# Patient Record
Sex: Male | Born: 1945 | Race: Black or African American | Hispanic: No | Marital: Married
Health system: Midwestern US, Community
[De-identification: ages and names within clinical notes are randomized; demographics above are authoritative.]

## PROBLEM LIST (undated history)

## (undated) DIAGNOSIS — Z8679 Personal history of other diseases of the circulatory system: Secondary | ICD-10-CM

## (undated) DIAGNOSIS — K4090 Unilateral inguinal hernia, without obstruction or gangrene, not specified as recurrent: Secondary | ICD-10-CM

## (undated) DIAGNOSIS — Z87442 Personal history of urinary calculi: Secondary | ICD-10-CM

## (undated) DIAGNOSIS — D696 Thrombocytopenia, unspecified: Secondary | ICD-10-CM

## (undated) DIAGNOSIS — K5792 Diverticulitis of intestine, part unspecified, without perforation or abscess without bleeding: Secondary | ICD-10-CM

## (undated) DIAGNOSIS — M1990 Unspecified osteoarthritis, unspecified site: Secondary | ICD-10-CM

## (undated) DIAGNOSIS — R7303 Prediabetes: Secondary | ICD-10-CM

## (undated) DIAGNOSIS — K760 Fatty (change of) liver, not elsewhere classified: Secondary | ICD-10-CM

## (undated) DIAGNOSIS — I10 Essential (primary) hypertension: Secondary | ICD-10-CM

## (undated) DIAGNOSIS — J45909 Unspecified asthma, uncomplicated: Secondary | ICD-10-CM

## (undated) DIAGNOSIS — G4730 Sleep apnea, unspecified: Secondary | ICD-10-CM

## (undated) DIAGNOSIS — K219 Gastro-esophageal reflux disease without esophagitis: Secondary | ICD-10-CM

## (undated) DIAGNOSIS — N400 Enlarged prostate without lower urinary tract symptoms: Secondary | ICD-10-CM

## (undated) DIAGNOSIS — K5790 Diverticulosis of intestine, part unspecified, without perforation or abscess without bleeding: Secondary | ICD-10-CM

## (undated) DIAGNOSIS — L570 Actinic keratosis: Secondary | ICD-10-CM

## (undated) DIAGNOSIS — E0590 Thyrotoxicosis, unspecified without thyrotoxic crisis or storm: Secondary | ICD-10-CM

## (undated) DIAGNOSIS — C61 Malignant neoplasm of prostate: Principal | ICD-10-CM

## (undated) HISTORY — PX: COLON RESECTION: SHX5231

## (undated) HISTORY — DX: Actinic keratosis: L57.0

## (undated) HISTORY — PX: CHOLECYSTECTOMY: SHX55

## (undated) HISTORY — PX: TRANSURETHRAL RESECTION OF PROSTATE: SHX73

---

## 2006-01-31 ENCOUNTER — Ambulatory Visit: Payer: Self-pay | Admitting: Unknown Physician Specialty

## 2006-03-15 ENCOUNTER — Ambulatory Visit: Payer: Self-pay | Admitting: Internal Medicine

## 2008-12-10 ENCOUNTER — Emergency Department: Payer: Self-pay | Admitting: Emergency Medicine

## 2009-04-09 ENCOUNTER — Ambulatory Visit: Payer: Self-pay | Admitting: Unknown Physician Specialty

## 2010-07-27 ENCOUNTER — Ambulatory Visit: Payer: Self-pay | Admitting: Unknown Physician Specialty

## 2010-08-16 ENCOUNTER — Other Ambulatory Visit: Payer: Self-pay

## 2010-09-14 ENCOUNTER — Ambulatory Visit: Payer: Self-pay

## 2010-09-20 ENCOUNTER — Ambulatory Visit: Payer: Self-pay | Admitting: Surgery

## 2010-09-27 ENCOUNTER — Inpatient Hospital Stay: Payer: Self-pay | Admitting: Surgery

## 2010-09-30 LAB — PATHOLOGY REPORT

## 2010-10-12 ENCOUNTER — Ambulatory Visit: Payer: Self-pay

## 2010-11-24 ENCOUNTER — Ambulatory Visit: Payer: Self-pay

## 2010-12-11 ENCOUNTER — Ambulatory Visit: Payer: Self-pay

## 2011-02-20 ENCOUNTER — Ambulatory Visit: Payer: Self-pay | Admitting: Unknown Physician Specialty

## 2012-11-27 ENCOUNTER — Ambulatory Visit: Payer: Self-pay | Admitting: Unknown Physician Specialty

## 2013-10-24 ENCOUNTER — Ambulatory Visit: Payer: Self-pay | Admitting: Unknown Physician Specialty

## 2014-01-08 ENCOUNTER — Ambulatory Visit: Payer: Self-pay | Admitting: Unknown Physician Specialty

## 2014-01-09 LAB — PATHOLOGY REPORT

## 2014-02-18 DIAGNOSIS — M1990 Unspecified osteoarthritis, unspecified site: Secondary | ICD-10-CM | POA: Insufficient documentation

## 2014-05-31 ENCOUNTER — Emergency Department: Payer: Self-pay | Admitting: Emergency Medicine

## 2014-05-31 LAB — BASIC METABOLIC PANEL
Anion Gap: 8 (ref 7–16)
BUN: 14 mg/dL (ref 7–18)
CREATININE: 1.06 mg/dL (ref 0.60–1.30)
Calcium, Total: 9.1 mg/dL (ref 8.5–10.1)
Chloride: 101 mmol/L (ref 98–107)
Co2: 30 mmol/L (ref 21–32)
EGFR (Non-African Amer.): >60
Glucose: 90 mg/dL (ref 65–99)
Osmolality: 278 (ref 275–301)
Potassium: 3.8 mmol/L (ref 3.5–5.1)
Sodium: 139 mmol/L (ref 136–145)

## 2014-05-31 LAB — CBC
HCT: 46.4 % (ref 40.0–52.0)
HGB: 15.2 g/dL (ref 13.0–18.0)
MCH: 30.8 pg (ref 26.0–34.0)
MCHC: 32.6 g/dL (ref 32.0–36.0)
MCV: 94 fL (ref 80–100)
Platelet: 127 10*3/uL — ABNORMAL LOW (ref 150–440)
RBC: 4.92 10*6/uL (ref 4.40–5.90)
RDW: 13.1 % (ref 11.5–14.5)
WBC: 5.5 10*3/uL (ref 3.8–10.6)

## 2014-05-31 LAB — MAGNESIUM: MAGNESIUM: 1.7 mg/dL — AB

## 2015-09-24 ENCOUNTER — Ambulatory Visit: Admit: 2015-09-24 | Discharge: 2015-09-24 | Payer: MEDICARE | Attending: Urology | Primary: Internal Medicine

## 2015-09-24 DIAGNOSIS — C61 Malignant neoplasm of prostate: Secondary | ICD-10-CM

## 2015-09-24 LAB — AMB POC URINALYSIS DIP STICK AUTO W/ MICRO (PGU)
Bilirubin (UA POC): NEGATIVE
Glucose (UA POC): NEGATIVE mg/dL
Ketones (UA POC): NEGATIVE
Leukocyte esterase (UA POC): NEGATIVE
Nitrites (UA POC): NEGATIVE
Protein (UA POC): NEGATIVE
Specific gravity (UA POC): 1.015 (ref 1.001–1.035)
Urobilinogen (POC): 1
pH (UA POC): 7 (ref 4.6–8.0)

## 2015-09-24 LAB — AMB POC PVR, MEAS,POST-VOID RES,US,NON-IMAGING

## 2015-09-24 MED ORDER — FINASTERIDE 5 MG TAB
5 mg | ORAL_TABLET | Freq: Every day | ORAL | 4 refills | Status: AC
Start: 2015-09-24 — End: 2015-12-23

## 2015-09-24 NOTE — Progress Notes (Signed)
[facility]  [address] [place] [address]  [phone]    Jason Fernandez  DOB: 1945-11-15      Chief Complaint   Patient presents with   ??? Prostate Cancer   ??? Erectile Dysfunction   ??? Urgency        HPI   70 y.o., male who is referred by Dr. Tessa Lerner for evaluation of a slow stream.  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  Latest PSA is 0.061 on 04/29/15 which remains stable.  Previously taking Flomax and Cialis 2.5mg  every day for mod LUTS.  No longer taking Cialis but recently retried without sig improvement.  Reports one yr history of progressive LUTS including slow stream, nocturia 2-3x per night and freq.  Denies any hematuria or UTI's.  PVR today is 127cc by ultrasound.  Last seen in 2013.      Past Medical History   Diagnosis Date   ??? Hypercholesterolemia    ??? Hypertension    ??? Personal history of prostate cancer      Past Surgical History   Procedure Laterality Date   ??? Hx other surgical       brachytherapy 3/04     Current Outpatient Prescriptions   Medication Sig Dispense Refill   ??? amLODIPine (NORVASC) 5 mg tablet      ??? metFORMIN ER (GLUCOPHAGE XR) 500 mg tablet      ??? rosuvastatin (CRESTOR) 20 mg tablet      ??? tamsulosin (FLOMAX) 0.4 mg capsule      ??? aspirin delayed-release 81 mg tablet Take  by mouth daily.     ??? Cholecalciferol, Vitamin D3, (VITAMIN D3) 2,000 unit cap capsule Take  by mouth two (2) times a day.       Allergies   Allergen Reactions   ??? Sulfa (Sulfonamide Antibiotics) Rash     Social History     Social History   ??? Marital status: MARRIED     Spouse name: N/A   ??? Number of children: N/A   ??? Years of education: N/A     Occupational History   ??? Not on file.     Social History Main Topics   ??? Smoking status: Never Smoker   ??? Smokeless tobacco: Not on file   ??? Alcohol use No   ??? Drug use: Not on file   ??? Sexual activity: Not on file     Other Topics Concern   ??? Not on file     Social History Narrative   ??? No narrative on file      History reviewed. No pertinent family history.      Review of Systems   Constitutional: Negative.    HENT: Negative.    Eyes: Negative.    Respiratory: Negative.    Cardiovascular:        HTN   Gastrointestinal: Negative.    Genitourinary: Positive for frequency and urgency.        Nocturia 2-3x  Decreased urinary stream  ED   Musculoskeletal: Negative.    Skin: Negative.    Neurological: Negative.    Endo/Heme/Allergies: Negative.    Psychiatric/Behavioral: Negative.        Physical Exam  Visit Vitals   ??? BP 156/90   ??? Pulse 63   ??? Ht 5\' 10"  (1.778 m)   ??? Wt 209 lb (94.8 kg)   ??? BMI 29.99 kg/m2     General appearance - alert, well appearing, and in  no distress  Mental status - alert, oriented to person, place, and time  Eyes - extraocular eye movements intact, sclera anicteric  Nose - normal and patent, no erythema, discharge or polyps  Mouth - mucous membranes moist  Abdomen - soft, nontender, nondistended, no masses or organomegaly  GU -  Testes normal to palpation without mass.  Phallus normal without mass or lesion present  Rectal - normal rectal tone, anodular prostate  Lymphatic-  No palpable lymphadenopathy  Neurological -  normal speech, no focal findings or movement disorder noted  Musculoskeletal - no deformity or swelling  Extremities - no pedal edema, no clubbing or cyanosis  Skin - normal coloration and turgor      Urinalysis  UA - Dipstick  Results for orders placed or performed in visit on 09/24/15   AMB POC URINALYSIS DIP STICK AUTO W/ MICRO (PGU)     Status: None   Result Value Ref Range Status    Glucose (UA POC) Negative Negative mg/dL Final    Bilirubin (UA POC) Negative Negative Final    Ketones (UA POC) Negative Negative Final    Specific gravity (UA POC) 1.015 1.001 - 1.035 Final    Blood (UA POC) Trace Negative Final    pH (UA POC) 7 4.6 - 8.0 Final    Protein (UA POC) Negative Negative Final    Urobilinogen (POC) 1 mg/dL  Final    Nitrites (UA POC) Negative Negative Final     Leukocyte esterase (UA POC) Negative Negative Final       UA - Micro  WBC - 0  RBC - 0-1  Bacteria - 0  Epith - 0    Assessment/Plan    ICD-10-CM ICD-9-CM    1. Prostate cancer (HCC) C61 185 AMB POC URINALYSIS DIP STICK AUTO W/ MICRO (PGU)      AMB POC PVR, MEAS,POST-VOID RES,US,NON-IMAGING   2. Benign prostatic hyperplasia, presence of lower urinary tract symptoms unspecified, unspecified morphology N40.0 600.00      All tx options reviewed.  Pt elected to start Proscar 5mg  every day in addition to Flomax.  RTO in 3 mo to reassess.  Discussed cysto if LUTS persist/worsen.    Dione Booze Sterrett, DO

## 2015-10-11 DIAGNOSIS — E559 Vitamin D deficiency, unspecified: Secondary | ICD-10-CM | POA: Insufficient documentation

## 2015-12-10 DIAGNOSIS — R140 Abdominal distension (gaseous): Secondary | ICD-10-CM | POA: Insufficient documentation

## 2015-12-12 ENCOUNTER — Emergency Department: Admit: 2015-12-12 | Payer: MEDICARE | Primary: Internal Medicine

## 2015-12-12 ENCOUNTER — Inpatient Hospital Stay
Admit: 2015-12-12 | Discharge: 2015-12-13 | Disposition: A | Discharge status: Home Health | Payer: MEDICARE | Attending: Internal Medicine | Admitting: Internal Medicine

## 2015-12-12 ENCOUNTER — Observation Stay: Admit: 2015-12-12 | Payer: MEDICARE | Primary: Internal Medicine

## 2015-12-12 DIAGNOSIS — I63511 Cerebral infarction due to unspecified occlusion or stenosis of right middle cerebral artery: Principal | ICD-10-CM

## 2015-12-12 LAB — CBC WITH AUTOMATED DIFF
ABS. BASOPHILS: 0 10*3/uL (ref 0.0–0.2)
ABS. EOSINOPHILS: 0 10*3/uL (ref 0.0–0.8)
ABS. IMM. GRANS.: 0 10*3/uL (ref 0.0–0.5)
ABS. LYMPHOCYTES: 1.5 10*3/uL (ref 0.5–4.6)
ABS. MONOCYTES: 0.5 10*3/uL (ref 0.1–1.3)
ABS. NEUTROPHILS: 3.2 10*3/uL (ref 1.7–8.2)
BASOPHILS: 0 % (ref 0.0–2.0)
EOSINOPHILS: 1 % (ref 0.5–7.8)
HCT: 42.6 % (ref 41.1–50.3)
HGB: 14.5 g/dL (ref 13.6–17.2)
IMMATURE GRANULOCYTES: 0.2 % (ref 0.0–5.0)
LYMPHOCYTES: 29 % (ref 13–44)
MCH: 27.3 PG (ref 26.1–32.9)
MCHC: 34 g/dL (ref 31.4–35.0)
MCV: 80.2 FL (ref 79.6–97.8)
MONOCYTES: 10 % (ref 4.0–12.0)
MPV: 9.6 FL — ABNORMAL LOW (ref 10.8–14.1)
NEUTROPHILS: 60 % (ref 43–78)
PLATELET: 129 10*3/uL — ABNORMAL LOW (ref 150–450)
RBC: 5.31 M/uL (ref 4.23–5.67)
RDW: 14.1 % (ref 11.9–14.6)
WBC: 5.3 10*3/uL (ref 4.3–11.1)

## 2015-12-12 LAB — METABOLIC PANEL, COMPREHENSIVE
A-G Ratio: 1.2 (ref 1.2–3.5)
ALT (SGPT): 34 U/L (ref 12–65)
AST (SGOT): 26 U/L (ref 15–37)
Albumin: 4.1 g/dL (ref 3.2–4.6)
Alk. phosphatase: 64 U/L (ref 50–136)
Anion gap: 8 mmol/L (ref 7–16)
BUN: 14 MG/DL (ref 8–23)
Bilirubin, total: 0.6 MG/DL (ref 0.2–1.1)
CO2: 25 mmol/L (ref 21–32)
Calcium: 8.9 MG/DL (ref 8.3–10.4)
Chloride: 104 mmol/L (ref 98–107)
Creatinine: 1.05 MG/DL (ref 0.8–1.5)
GFR est AA: >60 mL/min/{1.73_m2} (ref >60)
GFR est non-AA: >60 mL/min/{1.73_m2} (ref >60)
Globulin: 3.3 g/dL (ref 2.3–3.5)
Glucose: 127 mg/dL — ABNORMAL HIGH (ref 65–100)
Potassium: 4.6 mmol/L (ref 3.5–5.1)
Protein, total: 7.4 g/dL (ref 6.3–8.2)
Sodium: 137 mmol/L (ref 136–145)

## 2015-12-12 MED ORDER — ROSUVASTATIN 20 MG TAB
20 mg | Freq: Every evening | ORAL | Status: DC
Start: 2015-12-12 — End: 2015-12-13
  Administered 2015-12-13: 03:00:00 via ORAL

## 2015-12-12 MED ORDER — AMLODIPINE 5 MG TAB
5 mg | Freq: Every day | ORAL | Status: DC
Start: 2015-12-12 — End: 2015-12-13
  Administered 2015-12-13: 13:00:00 via ORAL

## 2015-12-12 MED ORDER — SODIUM CHLORIDE 0.9 % IV
INTRAVENOUS | Status: DC
Start: 2015-12-12 — End: 2015-12-13
  Administered 2015-12-12 – 2015-12-13 (×2): via INTRAVENOUS

## 2015-12-12 MED ORDER — ASPIRIN 81 MG TAB, DELAYED RELEASE
81 mg | Freq: Every day | ORAL | Status: DC
Start: 2015-12-12 — End: 2015-12-13
  Administered 2015-12-13: 13:00:00 via ORAL

## 2015-12-12 MED ORDER — FINASTERIDE 5 MG TAB
5 mg | Freq: Every day | ORAL | Status: DC
Start: 2015-12-12 — End: 2015-12-13
  Administered 2015-12-13: 13:00:00 via ORAL

## 2015-12-12 MED ORDER — SODIUM CHLORIDE 0.9 % IJ SYRG
Freq: Three times a day (TID) | INTRAMUSCULAR | Status: DC
Start: 2015-12-12 — End: 2015-12-13
  Administered 2015-12-12 – 2015-12-13 (×3): via INTRAVENOUS

## 2015-12-12 MED ORDER — ENOXAPARIN 40 MG/0.4 ML SUB-Q SYRINGE
40 mg/0.4 mL | SUBCUTANEOUS | Status: DC
Start: 2015-12-12 — End: 2015-12-13
  Administered 2015-12-12: 21:00:00 via SUBCUTANEOUS

## 2015-12-12 MED ORDER — TAMSULOSIN SR 0.4 MG 24 HR CAP
0.4 mg | Freq: Every day | ORAL | Status: DC
Start: 2015-12-12 — End: 2015-12-13
  Administered 2015-12-13: 13:00:00 via ORAL

## 2015-12-12 MED ORDER — SODIUM CHLORIDE 0.9 % IJ SYRG
INTRAMUSCULAR | Status: DC | PRN
Start: 2015-12-12 — End: 2015-12-13

## 2015-12-12 MED ORDER — ACETAMINOPHEN 325 MG TABLET
325 mg | ORAL | Status: DC | PRN
Start: 2015-12-12 — End: 2015-12-13

## 2015-12-12 MED ORDER — METFORMIN 500 MG TAB
500 mg | Freq: Two times a day (BID) | ORAL | Status: DC
Start: 2015-12-12 — End: 2015-12-13
  Administered 2015-12-12 – 2015-12-13 (×2): via ORAL

## 2015-12-12 MED FILL — METFORMIN 500 MG TAB: 500 mg | ORAL | Qty: 1

## 2015-12-12 MED FILL — LOVENOX 40 MG/0.4 ML SUBCUTANEOUS SYRINGE: 40 mg/0.4 mL | SUBCUTANEOUS | Qty: 0.4

## 2015-12-12 MED FILL — SODIUM CHLORIDE 0.9 % IV: INTRAVENOUS | Qty: 1000

## 2015-12-12 NOTE — ED Triage Notes (Signed)
Pt arrive via EMS coming from church c/o headache with some slurred speech. EMS states symptoms started at 0920. Symptoms resolved 15-20 min pta. BP 140/90. Negative for cincinatti scale. No hx of stroke.

## 2015-12-12 NOTE — H&P (Signed)
HOSPITALIST H&P      NAME:  Jason Fernandez   Age:  70 y.o.  DOB:   01/31/1946   MRN:   [id number]    PCP: Bobette Mo, MD    Attending MD: Faythe Casa, DO    Treatment Team: Attending Provider: Freddi Che, MD; Primary Nurse: Garry Heater, RN    HPI:     Jason Fernandez is a 70 year old AAM with a PMH of HTN, HLD, and DM2 presented to the ER from church after the other members noticed him slurring his words and was counting money slower than usual. He also complains of a headache this morning.    Per the wife, the patient "slept in" this morning which is unusual for him and was slow to wake up. Once she did get him up, he had trouble getting to his feet. She then heard a loud noise from the other side of the house and she believes he either ran into a door or fell, but when she got to him all of his medications were on the floor.    They then left for their respective churches and then some of his fellow church members thought he was slurring his words so they brought him to the ER.    Complete ROS done and is as stated in HPI or otherwise negative    Past Medical History:   Diagnosis Date   ??? Hypercholesterolemia    ??? Hypertension    ??? Personal history of prostate cancer         Past Surgical History:   Procedure Laterality Date   ??? HX OTHER SURGICAL      brachytherapy 3/04        Prior to Admission Medications   Prescriptions Last Dose Informant Patient Reported? Taking?   Cholecalciferol, Vitamin D3, (VITAMIN D3) 2,000 unit cap capsule   Yes No   Sig: Take  by mouth two (2) times a day.   amLODIPine (NORVASC) 5 mg tablet   Yes No   aspirin delayed-release 81 mg tablet   Yes No   Sig: Take  by mouth daily.   finasteride (PROSCAR) 5 mg tablet   No No   Sig: Take 1 Tab by mouth daily for 90 days.   metFORMIN ER (GLUCOPHAGE XR) 500 mg tablet   Yes No   rosuvastatin (CRESTOR) 20 mg tablet   Yes No   tamsulosin (FLOMAX) 0.4 mg capsule   Yes No      Facility-Administered Medications: None        Allergies   Allergen Reactions   ??? Sulfa (Sulfonamide Antibiotics) Rash        Social History   Substance Use Topics   ??? Smoking status: Never Smoker   ??? Smokeless tobacco: Not on file   ??? Alcohol use No        History reviewed. No pertinent family history.     Objective:       Visit Vitals   ??? BP 153/73 (BP 1 Location: Left arm, BP Patient Position: At rest)   ??? Pulse (!) 56   ??? Temp 98.1 ??F (36.7 ??C)   ??? Resp 26   ??? Ht 5' 10"  (1.778 m)   ??? Wt 94.8 kg (209 lb)   ??? SpO2 98%   ??? BMI 29.99 kg/m2        Temp (24hrs), Avg:98.2 ??F (36.8 ??C), Min:98.1 ??F (36.7 ??C), Max:98.2 ??F (36.8 ??C)  Oxygen Therapy  O2 Sat (%): 98 % (12/12/15 1449)  Pulse via Oximetry: 56 beats per minute (12/12/15 1449)  O2 Device: Room air (12/12/15 1440)      Physical Exam:      General:    Alert, cooperative, no distress, appears stated age.     Eyes:   PERRLA EOMI Anicteric  Head:   Normocephalic, without obvious abnormality, atraumatic.  ENT:  Nares normal. No drainage.  Lungs:   Clear to auscultation bilaterally.  No Wheezing or Rhonchi. No rales.  Heart:   Regular rate and rhythm,  no murmur, rub or gallop. No JVD.  Abdomen:   Soft, non-tender. Not distended.  Bowel sounds normal.   MSK:  No edema. No clubbing or cyanosis. No deformities/lesions.  Skin:     Texture, turgor normal. No rashes or lesions.  No Jaundice.  Neurologic: Alert and oriented x 3, no focal deficits   Psychiatry:      No depression/anxiety. Mood congruent for context.  Heme/Lymph/Immune: No echymoses or overt signs of bleeding.    Data Review:   Recent Results (from the past 24 hour(s))   METABOLIC PANEL, COMPREHENSIVE    Collection Time: 12/12/15  1:05 PM   Result Value Ref Range    Sodium 137 136 - 145 mmol/L    Potassium 4.6 3.5 - 5.1 mmol/L    Chloride 104 98 - 107 mmol/L    CO2 25 21 - 32 mmol/L    Anion gap 8 7 - 16 mmol/L    Glucose 127 (H) 65 - 100 mg/dL    BUN 14 8 - 23 MG/DL    Creatinine 1.05 0.8 - 1.5 MG/DL    GFR est AA >60 >60 ml/min/1.8m     GFR est non-AA >60 >60 ml/min/1.740m   Calcium 8.9 8.3 - 10.4 MG/DL    Bilirubin, total 0.6 0.2 - 1.1 MG/DL    ALT (SGPT) 34 12 - 65 U/L    AST (SGOT) 26 15 - 37 U/L    Alk. phosphatase 64 50 - 136 U/L    Protein, total 7.4 6.3 - 8.2 g/dL    Albumin 4.1 3.2 - 4.6 g/dL    Globulin 3.3 2.3 - 3.5 g/dL    A-G Ratio 1.2 1.2 - 3.5     CBC WITH AUTOMATED DIFF    Collection Time: 12/12/15  1:10 PM   Result Value Ref Range    WBC 5.3 4.3 - 11.1 K/uL    RBC 5.31 4.23 - 5.67 M/uL    HGB 14.5 13.6 - 17.2 g/dL    HCT 42.6 41.1 - 50.3 %    MCV 80.2 79.6 - 97.8 FL    MCH 27.3 26.1 - 32.9 PG    MCHC 34.0 31.4 - 35.0 g/dL    RDW 14.1 11.9 - 14.6 %    PLATELET 129 (L) 150 - 450 K/uL    MPV 9.6 (L) 10.8 - 14.1 FL    DF AUTOMATED      NEUTROPHILS 60 43 - 78 %    LYMPHOCYTES 29 13 - 44 %    MONOCYTES 10 4.0 - 12.0 %    EOSINOPHILS 1 0.5 - 7.8 %    BASOPHILS 0 0.0 - 2.0 %    IMMATURE GRANULOCYTES 0.2 0.0 - 5.0 %    ABS. NEUTROPHILS 3.2 1.7 - 8.2 K/UL    ABS. LYMPHOCYTES 1.5 0.5 - 4.6 K/UL    ABS. MONOCYTES 0.5 0.1 - 1.3 K/UL  ABS. EOSINOPHILS 0.0 0.0 - 0.8 K/UL    ABS. BASOPHILS 0.0 0.0 - 0.2 K/UL    ABS. IMM. GRANS. 0.0 0.0 - 0.5 K/UL   EKG, 12 LEAD, INITIAL    Collection Time: 12/12/15  1:14 PM   Result Value Ref Range    Ventricular Rate 51 BPM    Atrial Rate 51 BPM    P-R Interval 164 ms    QRS Duration 90 ms    Q-T Interval 454 ms    QTC Calculation (Bezet) 418 ms    Calculated P Axis 40 degrees    Calculated R Axis -31 degrees    Calculated T Axis 12 degrees    Diagnosis       !! AGE AND GENDER SPECIFIC ECG ANALYSIS !!  Sinus bradycardia  Left axis deviation  Abnormal ECG  No previous ECGs available         Imaging /Procedures /Studies    CT HEAD  Normal    Assessment and Plan:       Active Hospital Problems    Diagnosis Date Noted   ??? HTN (hypertension) 12/12/2015   ??? Slurred speech 12/12/2015   ??? Hyperlipemia 12/12/2015   ??? Diabetes mellitus type 2, controlled ([place]) 12/12/2015       PLAN   - Admit to remote telemetry under observation for slurred speech and ataxia  - ASA 44m daily  - Crestor daily  - Start IVFs  - Check MRI Brain  - Check Carotid dopplers and ECHO  - Continue home Amlodipine  - PT/ST/OT. Place PPD.  - Continue home Metformin. Humalog SSI.  - Continue BPH meds    Code Status: FULL CODE  DVT Prophylaxis: Lovenox    Anticipated discharge: 24-48 hours    WFaythe Casa DO  2:58 PM

## 2015-12-12 NOTE — ED Notes (Signed)
TRANSFER - OUT REPORT:    Verbal report given to Melody, RN on Jason Fernandez  being transferred to 6th floor for routine progression of care       Report consisted of patient???s Situation, Background, Assessment and   Recommendations(SBAR).     Information from the following report(s) SBAR, ED Summary and MAR was reviewed with the receiving nurse.    Lines:       Opportunity for questions and clarification was provided.

## 2015-12-12 NOTE — Progress Notes (Signed)
MRI consent signed. No metal. Not claustrophobic

## 2015-12-12 NOTE — Progress Notes (Signed)
TRANSFER - IN REPORT:    Verbal report received from [place] rn(name) on Renn Bucholz  being received from er(unit) for routine progression of care      Report consisted of patient???s Situation, Background, Assessment and   Recommendations(SBAR).     Information from the following report(s) SBAR was reviewed with the receiving nurse.    Opportunity for questions and clarification was provided.      Assessment completed upon patient???s arrival to unit and care assumed.

## 2015-12-12 NOTE — ED Provider Notes (Signed)
HPI Comments: Patient is a 70 yo male who the has a history of hypertension and high cholesterol.  At church today it was reportedly he had some slurred speech for about 15 minutes.  This has resolved.  He denies that he had any slurred speech.  He does state he had a mild headache.  He has no past CVA.    Patient is a 70 y.o. male presenting with headaches and context. The history is provided by the patient.   Headache    Pertinent negatives include no fever, no palpitations, no shortness of breath, no nausea and no vomiting.   Dysarthria   Associated symptoms include headaches. Pertinent negatives include no shortness of breath, no chest pain, no vomiting and no nausea.        Past Medical History:   Diagnosis Date   ??? Hypercholesterolemia    ??? Hypertension    ??? Personal history of prostate cancer        Past Surgical History:   Procedure Laterality Date   ??? HX OTHER SURGICAL      brachytherapy 3/04         History reviewed. No pertinent family history.    Social History     Social History   ??? Marital status: MARRIED     Spouse name: N/A   ??? Number of children: N/A   ??? Years of education: N/A     Occupational History   ??? Not on file.     Social History Main Topics   ??? Smoking status: Never Smoker   ??? Smokeless tobacco: Not on file   ??? Alcohol use No   ??? Drug use: Not on file   ??? Sexual activity: Not on file     Other Topics Concern   ??? Not on file     Social History Narrative         ALLERGIES: Sulfa (sulfonamide antibiotics)    Review of Systems   Constitutional: Negative for chills and fever.   Respiratory: Negative for chest tightness, shortness of breath, wheezing and stridor.    Cardiovascular: Negative for chest pain and palpitations.   Gastrointestinal: Negative for abdominal pain, nausea and vomiting.   Skin: Negative.    Neurological: Positive for headaches.   All other systems reviewed and are negative.      Vitals:    12/12/15 1301   BP: 146/70   Pulse: 63   Resp: 18   Temp: 98.2 ??F (36.8 ??C)    SpO2: 99%   Weight: 94.8 kg (209 lb)   Height: 5' 10"  (1.778 m)            Physical Exam   Constitutional: He is oriented to person, place, and time. He appears well-developed and well-nourished. No distress.   HENT:   Head: Normocephalic and atraumatic.   Eyes: Conjunctivae are normal. No scleral icterus.   Neck: Normal range of motion. Neck supple.   Cardiovascular: Normal rate, regular rhythm and normal heart sounds.    Pulmonary/Chest: Effort normal and breath sounds normal. No stridor. No respiratory distress. He has no wheezes. He has no rales. He exhibits no tenderness.   Abdominal: Soft. He exhibits no distension. There is no tenderness. There is no rebound and no guarding.   Neurological: He is alert and oriented to person, place, and time. No cranial nerve deficit. Coordination normal.   5/5 strength in upper and lower extremities.  No focal weakness.  Normal finger to nose testing and  clear speech.     Skin: Skin is warm and dry. No rash noted. He is not diaphoretic. No erythema.   Psychiatric: He has a normal mood and affect. His behavior is normal.   Nursing note and vitals reviewed.       MDM  Number of Diagnoses or Management Options  Diagnosis management comments: Patient currently seems neurovascularly intact, but there were several episodes this morning of slurred speech, walking into walls, and not being able to count money.  Patient minimizing these symptoms but family and church members confirm.  I am admitting him for tia evaluation.    Freddi Che, MD; 12/12/2015 @2 :28 PM Voice dictation software was used during the making of this note.  This software is not perfect and grammatical and other typographical errors may be present.  This note has not been proofread for errors.  ===================================================================        Amount and/or Complexity of Data Reviewed  Clinical lab tests: ordered and reviewed (Results for orders placed or  performed during the hospital encounter of 12/12/15  -CBC WITH AUTOMATED DIFF       Result                                            Value                         Ref Range                       WBC                                               5.3                           4.3 - 11.1 K/uL                 RBC                                               5.31                          4.23 - 5.67 M/uL                HGB                                               14.5                          13.6 - 17.2 g/dL                HCT  42.6                          41.1 - 50.3 %                   MCV                                               80.2                          79.6 - 97.8 FL                  MCH                                               27.3                          26.1 - 32.9 PG                  MCHC                                              34.0                          31.4 - 35.0 g/dL                RDW                                               14.1                          11.9 - 14.6 %                   PLATELET                                          129 (L)                       150 - 450 K/uL                  MPV                                               9.6 (L)                       10.8 - 14.1 FL  DF                                                AUTOMATED                                                     NEUTROPHILS                                       60                            43 - 78 %                       LYMPHOCYTES                                       29                            13 - 44 %                       MONOCYTES                                         10                            4.0 - 12.0 %                    EOSINOPHILS                                       1                             0.5 - 7.8 %                     BASOPHILS                                         0                              0.0 - 2.0 %                     IMMATURE GRANULOCYTES                             0.2  0.0 - 5.0 %                     ABS. NEUTROPHILS                                  3.2                           1.7 - 8.2 K/UL                  ABS. LYMPHOCYTES                                  1.5                           0.5 - 4.6 K/UL                  ABS. MONOCYTES                                    0.5                           0.1 - 1.3 K/UL                  ABS. EOSINOPHILS                                  0.0                           0.0 - 0.8 K/UL                  ABS. BASOPHILS                                    0.0                           0.0 - 0.2 K/UL                  ABS. IMM. GRANS.                                  0.0                           0.0 - 0.5 K/UL             -METABOLIC PANEL, COMPREHENSIVE       Result                                            Value  Ref Range                       Sodium                                            137                           136 - 145 mmol/L                Potassium                                         4.6                           3.5 - 5.1 mmol/L                Chloride                                          104                           98 - 107 mmol/L                 CO2                                               25                            21 - 32 mmol/L                  Anion gap                                         8                             7 - 16 mmol/L                   Glucose                                           127 (H)                       65 - 100 mg/dL                  BUN  14                            8 - 23 MG/DL                    Creatinine                                        1.05                          0.8 - 1.5 MG/DL                 GFR est AA                                        >60                            >60 ml/min/1.49m               GFR est non-AA                                    >60                           >60 ml/min/1.72m              Calcium                                           8.9                           8.3 - 10.4 MG/DL                Bilirubin, total                                  0.6                           0.2 - 1.1 MG/DL                 ALT (SGPT)                                        34                            12 - 65 U/L                     AST (SGOT)  26                            15 - 37 U/L                     Alk. phosphatase                                  64                            50 - 136 U/L                    Protein, total                                    7.4                           6.3 - 8.2 g/dL                  Albumin                                           4.1                           3.2 - 4.6 g/dL                  Globulin                                          3.3                           2.3 - 3.5 g/dL                  A-G Ratio                                         1.2                           1.2 - 3.5                  -EKG, 12 LEAD, INITIAL       Result                                            Value                         Ref Range                       Ventricular Rate  51                            BPM                             Atrial Rate                                       51                            BPM                             P-R Interval                                      164                           ms                              QRS Duration                                      90                            ms                              Q-T Interval                                      454                           ms                              QTC Calculation (Bezet)                           418                            ms                              Calculated P Axis                                 40                            degrees  Calculated R Axis                                 -31                           degrees                         Calculated T Axis                                 12                            degrees                         Diagnosis                                                                                                   !! AGE AND GENDER SPECIFIC ECG ANALYSIS !!   Sinus bradycardia   Left axis deviation   Abnormal ECG   No previous ECGs available    )  Tests in the radiology section of CPT??: ordered and reviewed (Ct Head Wo Cont    Result Date: 12/12/2015  Noncontrast head CT Clinical Indication: Acute headache and dysarthria today. Technique: Noncontrast axial images were obtained through the brain. Comparison: None available Findings: There is no acute intracranial hemorrhage, hydrocephalus, intra-axial mass, or mass-effect. There is no CT evidence of acute large artery territorial infarction or abnormal extra-axial fluid collection. The mastoid air cells and paranasal sinuses are unremarkable. No displaced skull fractures are present.     Impression: No acute intracranial abnormality.    )  Independent visualization of images, tracings, or specimens: yes (Normal sinus rhythm, narrow QRS complex, no bundle branch blocks, and no ST segment elevation )      ED Course       Procedures

## 2015-12-12 NOTE — Progress Notes (Addendum)
Admitted to floor oriented to room and call light system dual skin assessment completed with kristi L. Skin intact. Complaints of mild headache 2/10. No slurred speech noted. Bedside STAND performed. No difficulties with swallowing. Placed on telemetry running sinus brady at 53 bpm. Patient alert and oriented

## 2015-12-13 ENCOUNTER — Observation Stay: Admit: 2015-12-13 | Payer: MEDICARE | Primary: Internal Medicine

## 2015-12-13 LAB — EKG, 12 LEAD, INITIAL
Atrial Rate: 51 {beats}/min
Calculated P Axis: 40 degrees
Calculated R Axis: -31 degrees
Calculated T Axis: 12 degrees
P-R Interval: 164 ms
Q-T Interval: 454 ms
QRS Duration: 90 ms
QTC Calculation (Bezet): 418 ms
Ventricular Rate: 51 {beats}/min

## 2015-12-13 LAB — GLUCOSE, POC
Glucose (POC): 102 mg/dL — ABNORMAL HIGH (ref 65–100)
Glucose (POC): 137 mg/dL — ABNORMAL HIGH (ref 65–100)

## 2015-12-13 LAB — HEMOGLOBIN A1C WITH EAG
Est. average glucose: 157 mg/dL
Hemoglobin A1c: 7.1 % — ABNORMAL HIGH (ref 4.8–6.0)

## 2015-12-13 LAB — LIPID PANEL
CHOL/HDL Ratio: 4.5
Cholesterol, total: 171 MG/DL (ref <200)
HDL Cholesterol: 38 MG/DL — ABNORMAL LOW (ref 40–60)
LDL, calculated: 109.8 MG/DL — ABNORMAL HIGH (ref <100)
Triglyceride: 116 MG/DL (ref 35–150)
VLDL, calculated: 23.2 MG/DL — ABNORMAL HIGH (ref 6.0–23.0)

## 2015-12-13 MED ORDER — ROSUVASTATIN 20 MG TAB
20 mg | Freq: Every evening | ORAL | Status: DC
Start: 2015-12-13 — End: 2015-12-13

## 2015-12-13 MED ORDER — ROSUVASTATIN 40 MG TAB
40 mg | ORAL_TABLET | Freq: Every evening | ORAL | 1 refills | Status: DC
Start: 2015-12-13 — End: 2018-02-01

## 2015-12-13 MED ORDER — ASPIRIN 81 MG TAB, DELAYED RELEASE
81 mg | ORAL_TABLET | Freq: Every day | ORAL | 1 refills | Status: AC
Start: 2015-12-13 — End: ?

## 2015-12-13 MED FILL — AMLODIPINE 5 MG TAB: 5 mg | ORAL | Qty: 1

## 2015-12-13 MED FILL — FINASTERIDE 5 MG TAB: 5 mg | ORAL | Qty: 1

## 2015-12-13 MED FILL — METFORMIN 500 MG TAB: 500 mg | ORAL | Qty: 1

## 2015-12-13 MED FILL — ASPIRIN 81 MG TAB, DELAYED RELEASE: 81 mg | ORAL | Qty: 1

## 2015-12-13 MED FILL — ROSUVASTATIN 20 MG TAB: 20 mg | ORAL | Qty: 1

## 2015-12-13 MED FILL — TAMSULOSIN SR 0.4 MG 24 HR CAP: 0.4 mg | ORAL | Qty: 1

## 2015-12-13 NOTE — Discharge Summary (Signed)
Hospitalist Discharge Summary     Patient ID:  Jason Fernandez  [id number]  70 y.o.  April 28, 1946  Admit date: 12/12/2015  1:03 PM  Discharge date and time: 12/13/2015  Attending: Faythe Casa, DO  PCP:  Bobette Mo, MD  Treatment Team: Attending Provider: Faythe Casa, DO; Utilization Review: Loyce Dys, RN; Care Manager: Jhonnie Garner Haudricourt    Principal Diagnosis Slurred speech   Principal Problem:    Slurred speech (12/12/2015)    Active Problems:    HTN (hypertension) (12/12/2015)      Hyperlipemia (12/12/2015)      Diabetes mellitus type 2, controlled ([place]) (12/12/2015)      Acute ischemic stroke ([place]) (12/13/2015)         From H&P:  70 year old AAM with a PMH of HTN, HLD, and DM2 presented to the ER from church after the other members noticed him slurring his words and was counting money slower than usual. He also complains of a headache this morning. Per the wife, the patient "slept in" this morning which is unusual for him and was slow to wake up. Once she did get him up, he had trouble getting to his feet. She then heard a loud noise from the other side of the house and she believes he either ran into a door or fell, but when she got to him all of his medications were on the floor.    Hospital Course:  The patient was admitted and MRI showed acute right MCA infarct. Carotid dopplers were normal. ECHO was normal. The patient had no further symptoms and was discharged home in stable condition with increased aspirin and Crestor doses. He will go to outpatient PT for balance control and will follow up with PCP in one week.    Significant Diagnostic Studies:       Labs: Results:       Chemistry Recent Labs      12/12/15   1305   GLU  127*   NA  137   K  4.6   CL  104   CO2  25   BUN  14   CREA  1.05   CA  8.9   AGAP  8   AP  64   TP  7.4   ALB  4.1   GLOB  3.3   AGRAT  1.2      CBC w/Diff Recent Labs      12/12/15   1310   WBC  5.3   RBC  5.31   HGB  14.5   HCT  42.6   PLT  129*   GRANS  60    LYMPH  29   EOS  1      Cardiac Enzymes No results for input(s): CPK, CKND1, MYO in the last 72 hours.    No lab exists for component: CKRMB, TROIP   Coagulation No results for input(s): PTP, INR, APTT in the last 72 hours.    No lab exists for component: INREXT    Lipid Panel Lab Results   Component Value Date/Time    Cholesterol, total 171 12/13/2015 06:39 AM    HDL Cholesterol 38 12/13/2015 06:39 AM    LDL, calculated 109.8 12/13/2015 06:39 AM    VLDL, calculated 23.2 12/13/2015 06:39 AM    Triglyceride 116 12/13/2015 06:39 AM    CHOL/HDL Ratio 4.5 12/13/2015 06:39 AM      BNP No results for input(s): BNPP in the last  72 hours.   Liver Enzymes Recent Labs      12/12/15   1305   TP  7.4   ALB  4.1   AP  64   SGOT  26      Thyroid Studies No results found for: T4, T3U, TSH, TSHEXT         Discharge Exam:  Visit Vitals   ??? BP 137/68   ??? Pulse 60   ??? Temp 98.6 ??F (37 ??C)   ??? Resp 19   ??? Ht 5\' 10"  (1.778 m)   ??? Wt 90.9 kg (200 lb 4.8 oz)   ??? SpO2 98%   ??? BMI 28.74 kg/m2     General appearance: alert, cooperative, no distress, appears stated age  Lungs: clear to auscultation bilaterally  Heart: regular rate and rhythm, S1, S2 normal, no murmur, click, rub or gallop  Abdomen: soft, non-tender. Bowel sounds normal. No masses,  no organomegaly  Extremities: no cyanosis or edema  Neurologic: Grossly normal    Disposition: home  Discharge Condition: stable  Patient Instructions:   Current Discharge Medication List      CONTINUE these medications which have CHANGED    Details   rosuvastatin (CRESTOR) 40 mg tablet Take 1 Tab by mouth nightly.  Qty: 30 Tab, Refills: 1      aspirin delayed-release 81 mg tablet Take 4 Tabs by mouth daily.  Qty: 30 Tab, Refills: 1         CONTINUE these medications which have NOT CHANGED    Details   amLODIPine (NORVASC) 5 mg tablet       metFORMIN ER (GLUCOPHAGE XR) 500 mg tablet       tamsulosin (FLOMAX) 0.4 mg capsule        Cholecalciferol, Vitamin D3, (VITAMIN D3) 2,000 unit cap capsule Take  by mouth two (2) times a day.      finasteride (PROSCAR) 5 mg tablet Take 1 Tab by mouth daily for 90 days.  Qty: 90 Tab, Refills: 4             Activity: Activity as tolerated  Diet: Regular Diet  Wound Care: None needed    Follow-up  ??   Dr. Tessa Lerner in one week  Time spent to discharge patient 35 minutes  Signed:  Faythe Casa, DO  12/13/2015  11:56 AM

## 2015-12-13 NOTE — Progress Notes (Signed)
PT Note:  PT orders received/reviewed and evaluation attempted.  Patient was getting ECHO at the time.  Evaluation will be re-attempted as schedule and patient's status allow.  Thanks,  Janyth Pupa, PT, DPT

## 2015-12-13 NOTE — Progress Notes (Signed)
Care Management Interventions  PCP Verified by CM: Yes  Transition of Care Consult (CM Consult): Discharge Planning  Discharge Durable Medical Equipment: No  Physical Therapy Consult: Yes  Current Support Network: Lives with Spouse, Own Home  Confirm Follow Up Transport: Family  Plan discussed with Pt/Family/Caregiver: Yes  Freedom of Choice Offered: Yes  Discharge Location  Discharge Placement: Home with outpatient services    SW advised patient of observation Status/A copy of Medicare Outpatient Observation Notice was provided to patient, a second signed copy of the Medicare Outpatient Observation Notice was placed on chart for scanning. Opportunity for questions provided to patient with all questions answered and addressed.     Pt lives with spouse in [place]. Pt was independent with ADL's  At baseline and driving. We will order to [facility] therapy for PT and ST at discharge.     No other identified needs.

## 2015-12-13 NOTE — Progress Notes (Signed)
Problem: Self Care Deficits Care Plan (Adult)  Goal: *Acute Goals and Plan of Care (Insert Text)  1. Patient will verbalize understanding of safety awareness, compensatory stratgies for self care, adaptive equipment, and fall prevention.  2. Patient will verbalize understanding of signs and symptoms of stroke.    Time Frame: 1 visit      OCCUPATIONAL THERAPY: Initial Assessment, Daily Note and Discharge 12/13/2015  INPATIENT: Hospital Day: 2  Payor: HUMANA MEDICARE / Plan: [place] HMO / Product Type: Managed Care Medicare /      NAME/AGE/GENDER: Jason Fernandez is a 70 y.o. male      PRIMARY DIAGNOSIS:  TIA  Acute ischemic stroke ([place]) Slurred speech Slurred speech        ICD-10: Treatment Diagnosis:        ?? Generalized Muscle Weakness (M62.81)  ?? Other lack of cordination (R27.8)   Precautions/Allergies:         Sulfa (sulfonamide antibiotics)       ASSESSMENT:      Mr. Vandegriff presents with wife at side, agreeable to therapy.  Pt lives with wife and is oriented x 4.  He states he feels like he's "back to normal". Pt exhibits mild slow processing with more complex activities and cognitive questions.  Patient verbalizes understanding of safety awareness, compensatory strategies for self care, adaptive equipment, and fall prevention as well as the signs and symptoms of stroke.  He does not appear to fully comprehend the extent of his medical condition though the MD just left his room and this therapist also went over information.  He appears to be in denial;  baseline behavior vs. Cognition???.  He presents with BUE strength and ROM WFLs; functional transfers and ADLs require slight extra time per pt and his wife-he is otherwise, independent.  Further OT in the acute setting is not indicated.  Pt may need further therapy to address cognitive behavioral deficits after discharge.  OT will discharge.      This section established at most recent assessment     1.            RECOMMENDED REHABILITATION/EQUIPMENT: (at time of discharge pending progress): Continue Skilled Therapy and Discussed with Case Management.                      OCCUPATIONAL PROFILE AND HISTORY:   History of Present Injury/Illness (Reason for Referral):  See H&P  Past Medical History/Comorbidities:   Mr. Mockler  has a past medical history of Hypercholesterolemia; Hypertension; and Personal history of prostate cancer.  Mr. Polan  has a past surgical history that includes other surgical.  Social History/Living Environment:   Home Environment: Private residence  # Steps to Enter: 2  Rails to Enter: No  One/Two Story Residence: One story  Living Alone: No  Support Systems: Copy  Patient Expects to be Discharged to:: Private residence  Current DME Used/Available at Home: None  Tub or Shower Type: Tub/Shower combination  Prior Level of Function/Work/Activity:  independent      Number of Personal Factors/Comorbidities that affect the Plan of Care: Expanded review of therapy/medical records (1-2):  MODERATE COMPLEXITY   ASSESSMENT OF OCCUPATIONAL PERFORMANCE::   Activities of Daily Living:           Basic ADLs (From Assessment) Complex ADLs (From Assessment)   Basic ADL  Feeding: Independent  Oral Facial Hygiene/Grooming: Independent  Bathing: Supervision  Upper Body Dressing: Independent  Lower Body Dressing: Modified independent  Toileting: Modified independent     Grooming/Bathing/Dressing Activities of Daily Living     Cognitive Retraining  Orientation Retraining: Situation  Problem Solving: Identifying the problem;Identifying the task;Inductive reason;General alternative solution  Executive Functions: Regulating behavior;Executing cognitive plans  Organizing/Sequencing: Three step sequence;Breaking task down;Prioritizing  Attention to Task: Distractibility  Following Commands: Follows multi-step simple commands/directions  Safety/Judgement: Fall prevention;Decreased awareness of need for  safety;Decreased insight into deficits  Cues: Verbal cues provided                 Naval architect : Supervision  Tub Transfer: Contact guard assistance     Bed/Mat Mobility  Supine to Sit: Modified independent  Sit to Supine: Modified independent  Sit to Stand: Supervision  Bed to Chair: Supervision  Scooting: Modified independent          Most Recent Physical Functioning:   Gross Assessment:  AROM: Within functional limits  Strength: Within functional limits  Sensation: Intact               Posture:     Balance:  Sitting: Intact  Standing: Impaired  Standing - Static: Good  Standing - Dynamic : Fair Bed Mobility:  Supine to Sit: Modified independent  Sit to Supine: Modified independent  Scooting: Modified independent  Wheelchair Mobility:     Transfers:  Sit to Stand: Supervision  Stand to Sit: Supervision  Bed to Chair: Supervision                    Patient Vitals for the past 6 hrs:       BP SpO2 Pulse   12/13/15 1118 137/68 98 % 60        Mental Status  Neurologic State: Alert  Orientation Level: Oriented X4  Cognition: Decreased attention/concentration  Perception: Appears intact  Perseveration: No perseveration noted  Safety/Judgement: Fall prevention, Decreased awareness of need for safety, Decreased insight into deficits                               Physical Skills Involved:  1. Balance  2. Mobility  3. Strength  4. Endurance Cognitive Skills Affected (resulting in the inability to perform in a timely and safe manner):  1. Attending  2. Perceiving  3. Thinking  4. Understanding  5. Problem Solving  6. Mental Sequencing  7. Learning  8. Remembering Psychosocial Skills Affected:  1. Interpersonal Interactions  2. Habits  3. Routines and Behaviors  4. Active Use of Coping Strategies  5. Environmental Adaptations   Number of elements that affect the Plan of Care: 5+:  HIGH COMPLEXITY   CLINICAL DECISION MAKING:   KB Home	[place] AM-PAC??? ???6 Clicks???   Basic Mobility Inpatient Short Form   How much help from another person does the patient currently need... Total A Lot A Little None   1.  Putting on and taking off regular lower body clothing?   [ ]  1   [ ]  2   [X]  3   [ ]  4   2.  Bathing (including washing, rinsing, drying)?   [ ]  1   [ ]  2   [X]  3   [ ]  4   3.  Toileting, which includes using toilet, bedpan or urinal?   [ ]  1   [ ]  2   [ ]  3   [X]  4   4.  Putting on and taking off regular  upper body clothing?   [ ]  1   [ ]  2   [ ]  3   [X]  4   5.  Taking care of personal grooming such as brushing teeth?   [ ]  1   [ ]  2   [ ]  3   [X]  4   6.  Eating meals?   [ ]  1   [ ]  2   [ ]  3   [X]  4   ?? 2007, Trustees of [employer], under license to [employer]. All rights reserved    Score:  Initial: 22 Most Recent: X (Date: -- )     Interpretation of Tool:  Represents activities that are increasingly more difficult (i.e. Bed mobility, Transfers, Gait).       Score 24 23 22-20 19-15 14-10 9-7 6       Modifier CH CI CJ CK CL CM CN         ?? Self Care:              [phone] - CURRENT STATUS:    CJ - 20%-39% impaired, limited or restricted              [id number] - GOAL STATUS:           CJ - 20%-39% impaired, limited or restricted              [id number] - D/C STATUS:                       CJ - 20%-39% impaired, limited or restricted  Payor: HUMANA MEDICARE / Plan: [place] HMO / Product Type: Managed Care Medicare /        Use of outcome tool(s) and clinical judgement create a POC that gives a: LOW COMPLEXITY             TREATMENT:   (In addition to Assessment/Re-Assessment sessions the following treatments were rendered)      Pre-treatment Symptoms/Complaints:    Pain: Initial:     0 Post Session:  0      Therapeutic Activity: (    10 minutes):  Therapeutic activities including education and self care training to improve/ increase understanding of safety awareness, compensatory stratgies for self care, adaptive equipment, and fall prevention.  Required moderate  verbal cueing to  increase pt's comprehension.      Assessment     Braces/Orthotics/Lines/Etc:   ?? O2 Device: Room air  Treatment/Session Assessment:    ?? Response to Treatment:  D/C after intervention  ?? Interdisciplinary Collaboration:  ?? Physical Therapist  ?? Occupational Therapist  ?? Registered Nurse  ?? Social Worker  ?? After treatment position/precautions:  ?? Bed/Chair-wheels locked  ?? Call light within reach  ?? RN notified  ?? Family at bedside  Total Treatment Duration:  OT Patient Time In/Time Out  Time In: 1119  Time Out: [place], OTR/L

## 2015-12-13 NOTE — Progress Notes (Signed)
STG: Pt will complete higher level problem solving tasks with 90% accuracy  STG: Pt will complete executive function tasks with 90% accuracy  STG: Pt will participate with sustained attention tasks with 90% accuracy  STG: Pt will participate with short term memory activities related to ADLs with 90% accuracy  STG: Pt will complete math/time reasoning tasks with 90% accuracy  LTG: Pt will reach highest functional level of cognition for maximum independence at discharge      Speech language pathology: bedside swallow and speech language note: Initial Assessment    NAME/AGE/GENDER: Jason Fernandez is a 70 y.o. male  DATE: 12/13/2015  PRIMARY DIAGNOSIS: TIA  Acute ischemic stroke [facility])       ICD-10: Treatment Diagnosis: cognitive communication deficits  INTERDISCIPLINARY COLLABORATION: hospitalistASSESSMENT:Based on the objective data described below, Jason Fernandez presents with a swallow within functional limits.  Patient and wife requesting coffee.  No overt signs/sx of aspiration with thin liquids, mixed, or solids trials.  Oral motor exam is within functional limits and dysarthria has resolved.  Patient with occasional dysfluencies during conversation; stutters at baseline per wife.  Patient is fully independent.  Handles finances and churches finances.  He is anxious to go home.  PT has recommended OP therapy for balance per MD but patient focused on this interfering with his "schedule".    Patient scored 24/30 on [facility] Cognitive Assessment with a norm score of 26 or greater.  Difficulty with trail making task and focusing during mental manipulation tasks related to calculations.  Recalled 2/5 items during short term recall task.    MRI confirmed right MCA infarct.  Patient's wife agrees with adding OP speech therapy to address mild cognitive changes.   Recommend continue diabetic regular diet.  Patient likely discharging soon per MD.  OP speech therapy to follow for cognitive assessment/tx.   Discussed with Education officer, museum.    Patient will benefit from skilled intervention to address the below impairments.  ????????This section established at most recent assessment??????????  PROBLEM LIST (Impairments causing functional limitations):  1. cognition  REHABILITATION POTENTIAL FOR STATED GOALS: Excellent  PLAN OF CARE:   Patient will benefit from skilled intervention to address the following impairments.  RECOMMENDATIONS AND PLANNED INTERVENTIONS (Benefits and precautions of therapy have been discussed with the patient.):  ?? continue prescribed diet  MEDICATIONS:  ?? With liquid  COMPENSATORY STRATEGIES/MODIFICATIONS INCLUDING:  ?? None  OTHER RECOMMENDATIONS (including follow up treatment recommendations):   ?? Family training/education  ?? Patient education  ?? cognitive tx  RECOMMENDED DIET MODIFICATIONS DISCUSSED WITH:  ?? Family  ?? Social worker  ?? Patient  FREQUENCY/DURATION: Continue to follow patient 3x a week or until short term goals are met to address above goals.RECOMMENDED REHABILITATION/EQUIPMENT: (at time of discharge pending progress):   Outpatient: Speech Therapy.SUBJECTIVE:   Cooperative.  History of Present Injury/Illness: Jason Fernandez  has a past medical history of Hypercholesterolemia; Hypertension; and Personal history of prostate cancer.  He also  has a past surgical history that includes other surgical.  Present Symptoms: transient slurred speech; mild decreased cognition  Pain Intensity 1: 0  Pain Location 1: Head  Pain Orientation 1: Mid  Pain Intervention(s) 1: Position  Current Medications:   No current facility-administered medications on file prior to encounter.      Current Outpatient Prescriptions on File Prior to Encounter   Medication Sig Dispense Refill   ??? amLODIPine (NORVASC) 5 mg tablet      ??? metFORMIN ER (GLUCOPHAGE XR) 500 mg tablet      ???  rosuvastatin (CRESTOR) 20 mg tablet      ??? tamsulosin (FLOMAX) 0.4 mg capsule       ??? aspirin delayed-release 81 mg tablet Take  by mouth daily.     ??? Cholecalciferol, Vitamin D3, (VITAMIN D3) 2,000 unit cap capsule Take  by mouth two (2) times a day.     ??? finasteride (PROSCAR) 5 mg tablet Take 1 Tab by mouth daily for 90 days. 90 Tab 4     Current Dietary Status:  Diabetic regular      Social History/Home Situation: home with wife  Home Environment: Private residence  # Steps to Enter: 2  Rails to Enter: No  One/Two Story Residence: One story  Living Alone: No  Support Systems: Copy  Patient Expects to be Discharged to:: Private residence  Current DME Used/Available at Home: None  Tub or Shower Type: Tub/Shower combination  OBJECTIVE:   Respiratory Status:        CXR Results:n/a  MRI/CT Results: acute right MCA  Oral Motor Structure/Speech:  Oral-Motor Structure/Motor Speech  Labial: No impairment  Dentition: Natural  Lingual: No impairment    Cognitive and Communication Status:  Neurologic State: Alert  Orientation Level: Oriented X4  Cognition: Decreased attention/concentration  Perception: Appears intact  Perseveration: No perseveration noted  Safety/Judgement: Decreased insight into deficits;Fall prevention    BEDSIDE SWALLOW EVALUATION  Oral Assessment:  Oral Assessment  Labial: No impairment  Dentition: Natural  Lingual: No impairment  P.O. Trials:  Patient Position: upright in bed    The patient was given tsp to straw amounts of the following:   Consistency Presented: Thin liquid;Mixed consistency;Solid  How Presented: Self-fed/presented    ORAL PHASE:  Bolus Acceptance: No impairment  Bolus Formation/Control: No impairment  Propulsion: No impairment     Oral Residue: None    PHARYNGEAL PHASE:  Initiation of Swallow: No impairment  Laryngeal Elevation: Functional  Aspiration Signs/Symptoms: None  Vocal Quality: No impairment           Pharyngeal Phase Characteristics: No impairment, issues, or problems     OTHER OBSERVATIONS:  Rate/bite size: WNL   Endurance: WNL      SPEECH-LANGUAGE COGNITIVE EVALUATION  Tests Given: [place] Cognitive Assessment    Motor Speech:  Oral-Motor Structure/Motor Speech  Labial: No impairment  Dentition: Natural  Lingual: No impairment    Auditory Comprehension:        Reading Comprehension:   Reading Comprehension  Visual Impairment: Glasses/contacts  Pre-Morbid Reading Status: Literate               Neuro-Linguistics:  Verbal Reasoning Tasks: No Impairment at basic level        Verbal Organization: Impaired        Executive Function: Impaired  \\     Memory: Impaired (mild)    Attention: Impaired       Vocal Quality: No impairment             Assessment/Reassessment only, no treatment provided today    Tool Used: Dysphagia Outcome and Severity Scale (DOSS)    Score Comments   Normal Diet   7 With no strategies or extra time needed       Functional Swallow   6 May have mild oral or pharyngeal delay       Mild Dysphagia     5 Which may require one diet consistency restricted (those who demonstrate penetration which is entirely cleared on MBS would be included)   Mild-Moderate Dysphagia  4 With 1-2 diet consistencies restricted       Moderate Dysphagia   3 With 2 or more diet consistencies restricted       Moderately Severe Dysphagia   2 With partial PO strategies (trials with ST only)       Severe Dysphagia   1 With inability to tolerate any PO safely          Score:  Initial: 7 Most Recent: X (Date: -- )   Interpretation of Tool: The Dysphagia Outcome and Severity Scale (DOSS) is a simple, easy-to-use, 7-point scale developed to systematically rate the functional severity of dysphagia based on objective assessment and make recommendations for diet level, independence level, and type of nutrition.     Score 7 6 5 4 3 2 1    Modifier CH CI CJ CK CL CM CN   ? Swallowing:    [id number] - CURRENT STATUS: CH - 0% impaired, limited or restricted   U0454 - GOAL STATUS:  CH - 0% impaired, limited or restricted    U9811 - D/C STATUS:  CH - 0% impaired, limited or restricted  Payor: HUMANA MEDICARE / Plan: [place] HMO / Product Type: Managed Care Medicare /     ________________________________________________________________________________________________    Safety:   After treatment position/precautions:  ?? Call light within reach  ?? Family at bedside  ?? Upright in Bed  Progression/Medical Necessity:   ?? Skilled intervention continues to be required due to decreased cognitive skills and decreased independence with activities of daily living.  Compliance with Program/Exercises: Will assess as treatment progresses.   Reason for Continuation of Services/Other Comments:  ?? Patient continues to require skilled intervention due to patient unable to attend/participate in therapy as expected.  Recommendations/Intent for next treatment session: "Treatment next visit will focus on reduction in assistance provided".    Total Treatment Duration:  Time In: 1030  Time Out: [place] MS, SLP

## 2015-12-13 NOTE — Progress Notes (Signed)
Pt up walking in room.  Denies needs or concerns at this time.  Alert and oriented to person and place.  Disoriented to time and situation.  Lungs sounds clear bilaterally with respirations even and unlabored.  Bowel sounds active in all quadrants.  +2 pulses bilaterally in upper and lower extremities.  IV capped with no signs of phlebitis.  ECHO tech in room.  Instructed to call for assistance.  Call light in reach.  Voiced understanding and identified call light.

## 2015-12-13 NOTE — Progress Notes (Signed)
Problem: Mobility Impaired (Adult and Pediatric)  Goal: *Acute Goals and Plan of Care (Insert Text)  LTG:  (1.)Jason Fernandez will move from supine to sit and sit to supine , scoot up and down and roll side to side in bed with INDEPENDENCE within 7 day(s).   (2.)Jason Fernandez will transfer from bed to chair and chair to bed with MODIFIED INDEPENDENCE using the least restrictive device within 7 day(s).   (3.)Jason Fernandez will ambulate with INDEPENDENCE for 500+ feet with the least restrictive device within 7 day(s).  (4.)Jason Fernandez will perform standing static and dynamic activities with GOOD balance x 15 minutes with SUPERVISION to improve safety within 7 day(s).  (5.)Jason Fernandez will ascend and descend 3 stairs using 0 hand rail(s) with SUPERVISION to improve functional mobility and safety within 7 day(s).    ________________________________________________________________________________________________       PHYSICAL THERAPY: INITIAL ASSESSMENT, AM 12/13/2015  INPATIENT: Hospital Day: 2  Payor: HUMANA MEDICARE / Plan: [place] HMO / Product Type: Managed Care Medicare /      NAME/AGE/GENDER: Jason Fernandez is a 70 y.o. male      PRIMARY DIAGNOSIS: TIA  Acute ischemic stroke ([place]) Slurred speech Slurred speech        ICD-10: Treatment Diagnosis:       ?? Other abnormalities of gait and mobility (R26.89)   Precaution/Allergies:  Sulfa (sulfonamide antibiotics)       ASSESSMENT:      Jason Fernandez is a pleasant 70 y.o. male admitted to the hospital for the above who was supine in bed upon arrival with wife present.  Pt reports he is independent with ADLs and ambulation at baseline with one recent fall.  Per pt, he lives with his wife in a one story house with 3 steps to enter.  Jason Fernandez presents to PT with Jason Fernandez AROM, strength, and coordination in B LEs.  He also reports intact sensation to light touch in L3-S2 dermatomes.  Pt performed bed mobility with modified independence and demonstrates  good sitting balance.  Pt given SBA for STS transfer and also has good static standing balance.  Jason Fernandez demonstrates fair (-) dynamic standing balance during ambulation, especially during turns and stairs.  Pt required supervision-minA for gait given he had several episodes of loss of balance.  His gait was observed to be slightly decreased with widened BOS and excessive bilateral supination.  Pt also negotiated stairs with no handrails during gait training with CGA-minA for balance.  Pt exhibits some decreased processing/command following as well during evaluation.  Pt appears unaware of deficits and reports that he feels he is at baseline.  Pt could benefit from skilled PT to address above deficits and return to prior level.       This section established at most recent assessment   PROBLEM LIST (Impairments causing functional limitations):  1. Decreased Ambulation Ability/Technique  2. Decreased Balance    INTERVENTIONS PLANNED: (Benefits and precautions of physical therapy have been discussed with the patient.)  1. Balance Exercise  2. Bed Mobility  3. Family Education  4. Gait Training  5. Neuromuscular Re-education/Strengthening  6. Therapeutic Activites  7. Transfer Training      TREATMENT PLAN: Frequency/Duration: 3 times a week for duration of hospital stay  Rehabilitation Potential For Stated Goals: GOOD      RECOMMENDED REHABILITATION/EQUIPMENT: (at time of discharge pending progress): Continue Skilled Therapy and Discussed with Case Management.  HISTORY:   History of Present Injury/Illness (Reason for Referral):  Per H&P:   HPI:      Jason Fernandez is a 70 year old AAM with a PMH of HTN, HLD, and DM2 presented to the ER from church after the other members noticed him slurring his words and was counting money slower than usual. He also complains of a headache this morning.     Per the wife, the patient "slept in" this morning which is unusual for him  and was slow to wake up. Once she did get him up, he had trouble getting to his feet. She then heard a loud noise from the other side of the house and she believes he either ran into a door or fell, but when she got to him all of his medications were on the floor.     They then left for their respective churches and then some of his fellow church members thought he was slurring his words so they brought him to the ER.        Past Medical History/Comorbidities:   Jason Fernandez  has a past medical history of Hypercholesterolemia; Hypertension; and Personal history of prostate cancer.  Jason Fernandez  has a past surgical history that includes other surgical.  Social History/Living Environment:   Home Environment: Private residence  # Steps to Enter: 2  Rails to Enter: No  One/Two Story Residence: One story  Living Alone: No  Support Systems: Copy  Patient Expects to be Discharged to:: Private residence  Current DME Used/Available at Home: None  Tub or Shower Type: Tub/Shower combination  Prior Level of Function/Work/Activity:  See above.      Number of Personal Factors/Comorbidities that affect the Plan of Care:  Recent fall 1-2: MODERATE COMPLEXITY   EXAMINATION:   Most Recent Physical Functioning:   Gross Assessment:  AROM: Within functional limits  Strength: Within functional limits  Sensation: Intact               Posture:     Balance:  Sitting: Intact  Standing: Impaired  Standing - Static: Good  Standing - Dynamic : Fair ((-)) Bed Mobility:  Supine to Sit: Modified independent  Scooting: Modified independent  Wheelchair Mobility:     Transfers:  Sit to Stand: Stand-by asssistance  Stand to Sit: Supervision  Gait:     Base of Support: Widened  Step Length: Left shortened;Right shortened  Gait Abnormalities: Path deviations  Distance (ft): 500 Feet (ft)  Ambulation - Level of Assistance: Minimal assistance  Number of Stairs Trained: 4   Stairs - Level of Assistance: Minimum assistance;Contact guard assistance  Rail Use: None       Body Structures Involved:  1. Nerves  2. Muscles Body Functions Affected:  1. Mental  2. Neuromusculoskeletal  3. Movement Related Activities and Participation Affected:  1. Mobility   Number of elements that affect the Plan of Care: 4+: HIGH COMPLEXITY   CLINICAL PRESENTATION:   Presentation: Stable and uncomplicated: LOW COMPLEXITY   CLINICAL DECISION MAKING:   KB Home	[place] AM-PAC??? ???6 Clicks???   Basic Mobility Inpatient Short Form  How much difficulty does the patient currently have... Unable A Lot A Little None   1.  Turning over in bed (including adjusting bedclothes, sheets and blankets)?   [ ]  1   [ ]  2   [ ]  3   [X]  4   2.  Sitting down on and standing up from a chair with arms (  e.g., wheelchair, bedside commode, etc.)   [ ]  1   [ ]  2   [ ]  3   [X]  4   3.  Moving from lying on back to sitting on the side of the bed?   [ ]  1   [ ]  2   [ ]  3   [X]  4   How much help from another person does the patient currently need... Total A Lot A Little None   4.  Moving to and from a bed to a chair (including a wheelchair)?   [ ]  1   [ ]  2   [ ]  3   [X]  4   5.  Need to walk in hospital room?   [ ]  1   [ ]  2   [X]  3   [ ]  4   6.  Climbing 3-5 steps with a railing?   [ ]  1   [ ]  2   [X]  3   [ ]  4   ?? 2007, [employer], under license to [employer]. All rights reserved    Score:  Initial: 22 Most Recent: X (Date: -- )     Interpretation of Tool:  Represents activities that are increasingly more difficult (i.e. Bed mobility, Transfers, Gait).       Score 24 23 22-20 19-15 14-10 9-7 6       Modifier CH CI CJ CK CL CM CN         ?? Mobility - Walking and Moving Around:              [phone] - CURRENT STATUS:    CJ - 20%-39% impaired, limited or restricted              [id number] - GOAL STATUS:           CI - 1%-19% impaired, limited or restricted              [id number] - D/C STATUS:                       ---------------To be  determined---------------  Payor: HUMANA MEDICARE / Plan: [place] HMO / Product Type: Managed Care Medicare /       Medical Necessity:     ?? Patient demonstrates good rehab potential due to higher previous functional level.  Reason for Services/Other Comments:  ?? Patient continues to require skilled intervention due to standing dynamic balance impairments'.   Use of outcome tool(s) and clinical judgement create a POC that gives a: Clear prediction of patient's progress: LOW COMPLEXITY                 TREATMENT:   (In addition to Assessment/Re-Assessment sessions the following treatments were rendered)   Pre-treatment Symptoms/Complaints:  No complaints  Pain: Initial:   Pain Intensity 1: 0  Post Session:  0      Assessment/Reassessment only, no treatment provided today     Braces/Orthotics/Lines/Etc:   ?? O2 Device: Room air  Treatment/Session Assessment:    ?? Response to Treatment:  Pt tolerated evaluation very well but does have some delayed processing.  ?? Interdisciplinary Collaboration:  ?? Physical Therapist  ?? Occupational Therapist  ?? Registered Nurse  ?? Physician  ?? Social Worker  ?? After treatment position/precautions:  ?? Bed/Chair-wheels locked  ?? Bed in low position  ?? Call light within reach  ?? Family at bedside  ?? Sitting on EOB  ??  Compliance with Program/Exercises: Will assess as treatment progresses.  ?? Recommendations/Intent for next treatment session:  "Next visit will focus on advancements to more challenging activities and reduction in assistance provided".  Total Treatment Duration:  PT Patient Time In/Time Out  Time In: 0945  Time Out: 0959     Janyth Pupa, PT, DPT

## 2015-12-13 NOTE — Progress Notes (Signed)
Care Management Interventions  PCP Verified by CM: Yes  Transition of Care Consult (CM Consult): Discharge Planning  Discharge Durable Medical Equipment: No  Physical Therapy Consult: Yes  Current Support Network: Lives with Spouse  Confirm Follow Up Transport: Family    SW advised patient/ spouse of observation Status/A copy of Medicare Outpatient Observation Notice was provided to patient, a second signed copy of the Medicare Outpatient Observation Notice was placed on chart for scanning. Opportunity for questions provided to patient and spouse with all questions answered and addressed.     Pt lives at home with spouse, described being independent at baseline with all ADL's, no oxygen or other equipment at home.     Per PT/ pt likely to benefit from outpatient therapy- we will make referral for [facility] Outpatient therapy at discharge.     No other needs identifed.

## 2015-12-13 NOTE — Progress Notes (Signed)
Discharge instructions and prescriptions given and reviewed with pt and wife, verbalizes understanding, medication side effect sheet reviewed with pt, pt discharged home with family.

## 2015-12-31 ENCOUNTER — Ambulatory Visit: Admit: 2015-12-31 | Discharge: 2015-12-31 | Payer: MEDICARE | Attending: Urology | Primary: Internal Medicine

## 2015-12-31 DIAGNOSIS — C61 Malignant neoplasm of prostate: Secondary | ICD-10-CM

## 2015-12-31 LAB — AMB POC URINALYSIS DIP STICK AUTO W/ MICRO (PGU)
Glucose (UA POC): NEGATIVE mg/dL
Ketones (UA POC): NEGATIVE
Leukocyte esterase (UA POC): NEGATIVE
Nitrites (UA POC): NEGATIVE
Protein (UA POC): NEGATIVE
Specific gravity (UA POC): 1.02 (ref 1.001–1.035)
Urobilinogen (POC): 1
pH (UA POC): 6.5 (ref 4.6–8.0)

## 2015-12-31 NOTE — Progress Notes (Signed)
[facility]  [address] [place] [address]  [phone]    Jason Fernandez  DOB: 01-02-1946     HPI   70 y.o., male returns in follow up for CaP and BPH.  LUTS have greatly improved after the addition of Proscar to Flomax 3 mo prior.  Reports improved stream, less urgency and nocturia down to 2x per night.  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 Latest PSA is 0.061 on 04/29/15 which remains stable. Denies any hematuria or UTI's. PVR at last visit was 127cc by ultrasound. Heading to [facility] next mo.      Past Medical History:   Diagnosis Date   ??? Hypercholesterolemia    ??? Hypertension    ??? Personal history of prostate cancer      Past Surgical History:   Procedure Laterality Date   ??? HX OTHER SURGICAL      brachytherapy 3/04     Current Outpatient Prescriptions   Medication Sig Dispense Refill   ??? rosuvastatin (CRESTOR) 40 mg tablet Take 1 Tab by mouth nightly. 30 Tab 1   ??? aspirin delayed-release 81 mg tablet Take 4 Tabs by mouth daily. 30 Tab 1   ??? amLODIPine (NORVASC) 5 mg tablet      ??? metFORMIN ER (GLUCOPHAGE XR) 500 mg tablet      ??? tamsulosin (FLOMAX) 0.4 mg capsule      ??? Cholecalciferol, Vitamin D3, (VITAMIN D3) 2,000 unit cap capsule Take  by mouth two (2) times a day.       Allergies   Allergen Reactions   ??? Lisinopril Hives   ??? Meloxicam Hives   ??? Oxaprozin Rash   ??? Sulfa (Sulfonamide Antibiotics) Rash     Social History     Social History   ??? Marital status: MARRIED     Spouse name: N/A   ??? Number of children: N/A   ??? Years of education: N/A     Occupational History   ??? Not on file.     Social History Main Topics   ??? Smoking status: Never Smoker   ??? Smokeless tobacco: Not on file   ??? Alcohol use No   ??? Drug use: Not on file   ??? Sexual activity: Not on file     Other Topics Concern   ??? Not on file     Social History Narrative     History reviewed. No pertinent family history.    Review of Systems   All systems reviewed and are negative at this time.    Physical Exam  Visit Vitals   ??? BP 136/73   ??? Pulse (!) 50   ??? Ht 5\' 10"  (1.778 m)   ??? Wt 202 lb 3.2 oz (91.7 kg)   ??? BMI 29.01 kg/m2     General appearance - alert, well appearing, and in no distress  Mental status - alert, oriented to person, place, and time  Eyes - extraocular eye movements intact, sclera anicteric  Abdomen - soft, nontender, nondistended, no masses or organomegaly  Neurological -  normal speech, no focal findings or movement disorder noted  Skin - normal coloration and turgor      Urinalysis  UA - Dipstick  Results for orders placed or performed in visit on 12/31/15   AMB POC URINALYSIS DIP STICK AUTO W/ MICRO (PGU)     Status: None   Result Value Ref Range Status    Glucose (  UA POC) Negative Negative mg/dL Final    Bilirubin (UA POC) Small Negative Final    Ketones (UA POC) Negative Negative Final    Specific gravity (UA POC) 1.020 1.001 - 1.035 Final    Blood (UA POC) Small Negative Final    pH (UA POC) 6.5 4.6 - 8.0 Final    Protein (UA POC) Negative Negative Final    Urobilinogen (POC) 1 mg/dL  Final    Nitrites (UA POC) Negative Negative Final    Leukocyte esterase (UA POC) Negative Negative Final       UA - Micro  WBC - 0  RBC - 1-2  Bacteria - 0  Epith - 0    Assessment/Plan    ICD-10-CM ICD-9-CM    1. Prostate cancer (HCC) C61 185 PSA, ULTRASENSITIVE   2. Benign prostatic hyperplasia, presence of lower urinary tract symptoms unspecified, unspecified morphology N40.0 600.00 AMB POC URINALYSIS DIP STICK AUTO W/ MICRO (PGU)     Cont Flomax and Proscar.  RTO in 6 mo with PSA prior.    Jason Booze Aalijah Mims, DO

## 2016-05-20 ENCOUNTER — Emergency Department
Admission: EM | Admit: 2016-05-20 | Discharge: 2016-05-20 | Disposition: A | Discharge status: Home / Self Care | Payer: No Typology Code available for payment source | Attending: Emergency Medicine | Admitting: Emergency Medicine

## 2016-05-20 ENCOUNTER — Emergency Department: Payer: No Typology Code available for payment source

## 2016-05-20 ENCOUNTER — Encounter: Payer: Self-pay | Admitting: Emergency Medicine

## 2016-05-20 DIAGNOSIS — I10 Essential (primary) hypertension: Secondary | ICD-10-CM | POA: Diagnosis not present

## 2016-05-20 DIAGNOSIS — R0602 Shortness of breath: Secondary | ICD-10-CM | POA: Diagnosis present

## 2016-05-20 DIAGNOSIS — J9801 Acute bronchospasm: Secondary | ICD-10-CM

## 2016-05-20 HISTORY — DX: Essential (primary) hypertension: I10

## 2016-05-20 LAB — CBC WITH DIFFERENTIAL/PLATELET
BASOS ABS: 0 10*3/uL (ref 0–0.1)
BASOS PCT: 0 %
Eosinophils Absolute: 0 10*3/uL (ref 0–0.7)
Eosinophils Relative: 0 %
HEMATOCRIT: 47.5 % (ref 40.0–52.0)
HEMOGLOBIN: 16.7 g/dL (ref 13.0–18.0)
LYMPHS PCT: 10 %
Lymphs Abs: 0.9 10*3/uL — ABNORMAL LOW (ref 1.0–3.6)
MCH: 31.9 pg (ref 26.0–34.0)
MCHC: 35 g/dL (ref 32.0–36.0)
MCV: 91 fL (ref 80.0–100.0)
MONO ABS: 0.7 10*3/uL (ref 0.2–1.0)
Monocytes Relative: 8 %
NEUTROS PCT: 82 %
Neutro Abs: 7.9 10*3/uL — ABNORMAL HIGH (ref 1.4–6.5)
Platelets: 130 10*3/uL — ABNORMAL LOW (ref 150–440)
RBC: 5.23 MIL/uL (ref 4.40–5.90)
RDW: 13.7 % (ref 11.5–14.5)
WBC: 9.6 10*3/uL (ref 3.8–10.6)

## 2016-05-20 LAB — COMPREHENSIVE METABOLIC PANEL
ALBUMIN: 4.5 g/dL (ref 3.5–5.0)
ALK PHOS: 54 U/L (ref 38–126)
ALT: 31 U/L (ref 17–63)
AST: 30 U/L (ref 15–41)
Anion gap: 3 — ABNORMAL LOW (ref 5–15)
BILIRUBIN TOTAL: 1 mg/dL (ref 0.3–1.2)
BUN: 15 mg/dL (ref 6–20)
CALCIUM: 9.4 mg/dL (ref 8.9–10.3)
CO2: 32 mmol/L (ref 22–32)
CREATININE: 1.16 mg/dL (ref 0.61–1.24)
Chloride: 99 mmol/L — ABNORMAL LOW (ref 101–111)
GFR calc Af Amer: >60 mL/min (ref >60)
GLUCOSE: 98 mg/dL (ref 65–99)
POTASSIUM: 3.8 mmol/L (ref 3.5–5.1)
Sodium: 134 mmol/L — ABNORMAL LOW (ref 135–145)
TOTAL PROTEIN: 7.2 g/dL (ref 6.5–8.1)

## 2016-05-20 LAB — BRAIN NATRIURETIC PEPTIDE: B NATRIURETIC PEPTIDE 5: 10 pg/mL (ref 0.0–100.0)

## 2016-05-20 LAB — TROPONIN I

## 2016-05-20 MED ORDER — IPRATROPIUM-ALBUTEROL 0.5-2.5 (3) MG/3ML IN SOLN
3.0000 mL | Freq: Once | RESPIRATORY_TRACT | Status: AC
  Administered 2016-05-20: 3 mL via RESPIRATORY_TRACT
  Filled 2016-05-20: qty 3

## 2016-05-20 NOTE — ED Notes (Signed)
Pt states he noticed shortness of breath yesterday. Pt states he has sleep apnea and woke up this morning with numbness to both of his arms and cramping in his legs that lasted longer in the right than the left. Pt is ambulatory to room from triage without respiratory difficulty. Pt states he feels like his throat and nares are constricted, but states he is moving air fine. Pt used rescue inhaler and flovent inhaler this morning. Pt states he also uses daily nasal sprays as well.

## 2016-05-20 NOTE — ED Triage Notes (Addendum)
Pt to ed with c/o sob since yesterday.  Pt appears in no acute distress at this time.  Pt is able to speak in full complete sentences. reports last night he felt like he was "restricted on breathing".  Pt sats 98%, RR 18 even and unlabored. Pt denies chest pain, reports he [place] currently on abx for sinus infection.  Pt skin warm and dry.  Pt denies fever.

## 2016-05-20 NOTE — ED Notes (Signed)
Patient transported to X-ray 

## 2016-05-20 NOTE — ED Provider Notes (Signed)
[facility] Emergency Department Provider Note   ____________________________________________    I have reviewed the triage vital signs and the nursing notes.   HISTORY  Chief Complaint Shortness of Breath     HPI YOUSAF Obrien is a 70 y.o. male who presents with complaints of increased work of breathing. Patient reports he feels as though he has to work harder to breathe normally. This developed this morning. He has never had this before. He does have a history of sleep apnea. He does not use CPAP as he does not tolerate it. He sees Dr. Raul Del of pulmonology. No chest pain no pleurisy and no recent travel. No calf pain or swelling. No history of PE. No diaphoresis or exertional component of shortness of breath.   Past Medical History:  Diagnosis Date  . Hypertension     There are no active problems to display for this patient.   History reviewed. No pertinent surgical history.  Prior to Admission medications   Not on File     Allergies Ciprofloxacin; Tetracyclines & related; Penicillins; and Sulfa antibiotics  History reviewed. No pertinent family history.  Social History Social History  Substance Use Topics  . Smoking status: Never Smoker  . Smokeless tobacco: Never Used  . Alcohol use Yes    Review of Systems  Constitutional: No fever/chills  ENT: No Throat swelling Cardiovascular: Denies chest pain. Respiratory:As above Gastrointestinal: No abdominal pain.  No nausea, no vomiting.   Genitourinary: Negative for dysuria. Musculoskeletal: Negative for back pain. Skin: Negative for rash.   10-point ROS otherwise negative.  ____________________________________________   PHYSICAL EXAM:  VITAL SIGNS: ED Triage Vitals [05/20/16 1238]  Enc Vitals Group     BP 119/70     Pulse Rate 67     Resp 18     Temp 97.5 F (36.4 C)     Temp Source Oral     SpO2 98 %     Weight 160 lb (72.6 kg)     Height 5\' 6"  (1.676 m)   Head Circumference      Peak Flow      Pain Score 0     Pain Loc      Pain Edu?      Excl. in [place]?     Constitutional: Alert and oriented. No acute distress. Pleasant and interactive Eyes: Conjunctivae are normal.  Head: Atraumatic. Nose: No congestion/rhinnorhea. Mouth/Throat: Mucous membranes are moist.   Neck:  Painless ROM Cardiovascular: Normal rate, regular rhythm. Grossly normal heart sounds.  Good peripheral circulation. Respiratory: Normal respiratory effort.  No retractions. Lungs CTAB. Gastrointestinal: Soft and nontender. No distention.  No CVA tenderness. Genitourinary: deferred Musculoskeletal: No lower extremity tenderness nor edema.  Warm and well perfused Neurologic:  Normal speech and language. No gross focal neurologic deficits are appreciated.  Skin:  Skin is warm, dry and intact. No rash noted. Psychiatric: Mood and affect are normal. Speech and behavior are normal.  ____________________________________________   LABS (all labs ordered are listed, but only abnormal results are displayed)  Labs Reviewed  CBC WITH DIFFERENTIAL/PLATELET - Abnormal; Notable for the following:       Result Value   Platelets 130 (*)    Neutro Abs 7.9 (*)    Lymphs Abs 0.9 (*)    All other components within normal limits  COMPREHENSIVE METABOLIC PANEL - Abnormal; Notable for the following:    Sodium 134 (*)    Chloride 99 (*)    Anion  gap 3 (*)    All other components within normal limits  TROPONIN I  BRAIN NATRIURETIC PEPTIDE   ____________________________________________  EKG  ED ECG REPORT I, Lavonia Drafts, the attending physician, personally viewed and interpreted this ECG.  Date: 05/20/2016 EKG Time: 1:23 PM Rate: 56 Rhythm: normal sinus rhythm QRS Axis: normal Intervals: normal ST/T Wave abnormalities: normal Conduction Disturbances: none Narrative Interpretation: unremarkable  ____________________________________________  RADIOLOGY  Chest x-ray is  normal ____________________________________________   PROCEDURES  Procedure(s) performed: No    Critical Care performed: No ____________________________________________   INITIAL IMPRESSION / ASSESSMENT AND PLAN / ED COURSE  Pertinent labs & imaging results that were available during my care of the patient were reviewed by me and considered in my medical decision making (see chart for details).  Patient presents with a sense of increased work of breathing. Overall he is well-appearing, respiratory rate is normal, exam is benign. We will obtain chest x-ray, labs, EKG and reevaluate.  Clinical Course  Value Comment By Time  [facility]: 31.9 (Reviewed) Lavonia Drafts, MD 09/09 1408  Chest x-ray unremarkable, EKG normal, labwork unremarkable. Patient feels better after DuoNeb.  Appearing and in no distress. Discussed results with patient, given that he is asymptomatic feel he is appropriate for discharge at this time. Suspect bronchospasm as the cause of his symptoms. He has follow-up with pulmonology and knows to return if any worsening. ____________________________________________   FINAL CLINICAL IMPRESSION(S) / ED DIAGNOSES  Final diagnoses:  Bronchospasm      NEW MEDICATIONS STARTED DURING THIS VISIT:  There are no discharge medications for this patient.    Note:  This document was prepared using Dragon voice recognition software and may include unintentional dictation errors.    Lavonia Drafts, MD 05/20/16 [phone]

## 2016-06-30 ENCOUNTER — Other Ambulatory Visit: Admit: 2016-06-30 | Discharge: 2016-06-30 | Payer: MEDICARE | Primary: Internal Medicine

## 2016-06-30 DIAGNOSIS — C61 Malignant neoplasm of prostate: Secondary | ICD-10-CM

## 2016-06-30 NOTE — Progress Notes (Signed)
PSA bloodwork drawn per orders of Dr.Sterrett on 12/31/2015.  Blood drawn without incident.

## 2016-07-01 LAB — PSA, ULTRASENSITIVE: PSA, ULTRASENSITIVE: 0.085 ng/mL (ref 0.000–4.000)

## 2016-07-07 ENCOUNTER — Ambulatory Visit: Admit: 2016-07-07 | Discharge: 2016-07-07 | Payer: MEDICARE | Attending: Urology | Primary: Internal Medicine

## 2016-07-07 DIAGNOSIS — C61 Malignant neoplasm of prostate: Secondary | ICD-10-CM

## 2016-07-07 LAB — AMB POC URINALYSIS DIP STICK AUTO W/ MICRO (PGU)
Glucose (UA POC): NEGATIVE mg/dL
Nitrites (UA POC): POSITIVE
Protein (UA POC): 30
Specific gravity (UA POC): 1.025 (ref 1.001–1.035)
Urobilinogen (POC): 1
pH (UA POC): 6 (ref 4.6–8.0)

## 2016-07-07 MED ORDER — CIPROFLOXACIN 500 MG TAB
500 mg | ORAL_TABLET | Freq: Two times a day (BID) | ORAL | 0 refills | Status: AC
Start: 2016-07-07 — End: 2016-07-14

## 2016-07-07 NOTE — Progress Notes (Signed)
[facility]  [address] [place] [address]  [phone]    Jason Fernandez  DOB: 12/09/1945     HPI   70 y.o., male returns in follow up for CaP and BPH.  Reports increased freq and nocturia last mo but LUTS have returned to baseline this mo (nocturia 1-2x).  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 Latest PSA is 0.085 on 07/01/16 which remains stable. Denies any hematuria or UTI's. PVR at prior visit was 127cc by ultrasound. PVR today is 19 cc.      Past Medical History:   Diagnosis Date   ??? Hypercholesterolemia    ??? Hypertension    ??? Personal history of prostate cancer      Past Surgical History:   Procedure Laterality Date   ??? HX OTHER SURGICAL      brachytherapy 3/04     Current Outpatient Prescriptions   Medication Sig Dispense Refill   ??? ciprofloxacin HCl (CIPRO) 500 mg tablet Take 1 Tab by mouth two (2) times a day for 7 days. 14 Tab 0   ??? rosuvastatin (CRESTOR) 40 mg tablet Take 1 Tab by mouth nightly. 30 Tab 1   ??? aspirin delayed-release 81 mg tablet Take 4 Tabs by mouth daily. 30 Tab 1   ??? amLODIPine (NORVASC) 5 mg tablet      ??? metFORMIN ER (GLUCOPHAGE XR) 500 mg tablet      ??? tamsulosin (FLOMAX) 0.4 mg capsule      ??? Cholecalciferol, Vitamin D3, (VITAMIN D3) 2,000 unit cap capsule Take  by mouth two (2) times a day.       Allergies   Allergen Reactions   ??? Lisinopril Hives   ??? Meloxicam Hives   ??? Oxaprozin Rash   ??? Sulfa (Sulfonamide Antibiotics) Rash     Social History     Social History   ??? Marital status: MARRIED     Spouse name: N/A   ??? Number of children: N/A   ??? Years of education: N/A     Occupational History   ??? Not on file.     Social History Main Topics   ??? Smoking status: Never Smoker   ??? Smokeless tobacco: Not on file   ??? Alcohol use No   ??? Drug use: Not on file   ??? Sexual activity: Not on file     Other Topics Concern   ??? Not on file     Social History Narrative     History reviewed. No pertinent family history.     Review of Systems  All systems reviewed and are negative at this time.    Physical Exam  Visit Vitals   ??? BP 139/80   ??? Pulse 67   ??? Temp 97.7 ??F (36.5 ??C) (Oral)   ??? Ht 5\' 10"  (1.778 m)   ??? Wt 201 lb (91.2 kg)   ??? BMI 28.84 kg/m2     General appearance - alert, well appearing, and in no distress  Mental status - alert, oriented to person, place, and time  Eyes - extraocular eye movements intact, sclera anicteric  DRE- anodular prostate  Neurological -  normal speech, no focal findings or movement disorder noted  Skin - normal coloration and turgor      Urinalysis  UA - Dipstick  Results for orders placed or performed in visit on 07/07/16   AMB POC URINALYSIS DIP STICK AUTO W/ MICRO (PGU)  Status: None   Result Value Ref Range Status    Glucose (UA POC) Negative Negative mg/dL Final    Bilirubin (UA POC) Small Negative Final    Ketones (UA POC) Trace Negative Final    Specific gravity (UA POC) 1.025 1.001 - 1.035 Final    Blood (UA POC) Small Negative Final    pH (UA POC) 6.0 4.6 - 8.0 Final    Protein (UA POC) 30  Negative Final    Urobilinogen (POC) 1 mg/dL  Final    Nitrites (UA POC) Positive Negative Final    Leukocyte esterase (UA POC) Moderate Negative Final       UA - Micro  WBC - 20-30  RBC - 3-5  Bacteria - 2+  Epith - 0    Assessment/Plan    ICD-10-CM ICD-9-CM    1. Prostate CA (HCC) C61 185 AMB POC URINALYSIS DIP STICK AUTO W/ MICRO (PGU)      PSA, ULTRASENSITIVE   2. Benign prostatic hyperplasia, unspecified whether lower urinary tract symptoms present N40.0 600.00    3. Urinary tract infection without hematuria, site unspecified N39.0 599.0 URINE CULTURE,COMPREHENSIVE     Ucx sent.  I e-rx Cipro.  Cont Flomax and Proscar.  If infections persist he may need further work up.  RTO in 6 mo with PSA prior.    Dione Booze Alvine Mostafa, DO

## 2016-07-11 LAB — URINE CULTURE,COMPREHENSIVE

## 2016-07-12 NOTE — Progress Notes (Signed)
Please have pt finish Cipro course.

## 2016-07-13 NOTE — Progress Notes (Signed)
LMOM

## 2016-08-16 ENCOUNTER — Ambulatory Visit: Payer: No Typology Code available for payment source | Admitting: Anesthesiology

## 2016-08-16 ENCOUNTER — Encounter: Payer: Self-pay | Admitting: *Deleted

## 2016-08-16 ENCOUNTER — Ambulatory Visit
Admission: RE | Admit: 2016-08-16 | Discharge: 2016-08-16 | Disposition: A | Discharge status: Home / Self Care | Payer: No Typology Code available for payment source | Source: Ambulatory Visit | Attending: Unknown Physician Specialty | Admitting: Unknown Physician Specialty

## 2016-08-16 ENCOUNTER — Encounter
Admission: RE | Disposition: A | Discharge status: Home / Self Care | Payer: Self-pay | Source: Ambulatory Visit | Attending: Unknown Physician Specialty

## 2016-08-16 DIAGNOSIS — K2960 Other gastritis without bleeding: Secondary | ICD-10-CM | POA: Diagnosis not present

## 2016-08-16 DIAGNOSIS — I4891 Unspecified atrial fibrillation: Secondary | ICD-10-CM | POA: Insufficient documentation

## 2016-08-16 DIAGNOSIS — Z79899 Other long term (current) drug therapy: Secondary | ICD-10-CM | POA: Insufficient documentation

## 2016-08-16 DIAGNOSIS — E0590 Thyrotoxicosis, unspecified without thyrotoxic crisis or storm: Secondary | ICD-10-CM | POA: Insufficient documentation

## 2016-08-16 DIAGNOSIS — K2970 Gastritis, unspecified, without bleeding: Secondary | ICD-10-CM | POA: Diagnosis not present

## 2016-08-16 DIAGNOSIS — I10 Essential (primary) hypertension: Secondary | ICD-10-CM | POA: Insufficient documentation

## 2016-08-16 DIAGNOSIS — J45909 Unspecified asthma, uncomplicated: Secondary | ICD-10-CM | POA: Insufficient documentation

## 2016-08-16 DIAGNOSIS — G4730 Sleep apnea, unspecified: Secondary | ICD-10-CM | POA: Diagnosis not present

## 2016-08-16 HISTORY — DX: Sleep apnea, unspecified: G47.30

## 2016-08-16 HISTORY — DX: Personal history of other diseases of the circulatory system: Z86.79

## 2016-08-16 HISTORY — DX: Thyrotoxicosis, unspecified without thyrotoxic crisis or storm: E05.90

## 2016-08-16 HISTORY — PX: ESOPHAGOGASTRODUODENOSCOPY (EGD) WITH PROPOFOL: SHX5813

## 2016-08-16 SURGERY — ESOPHAGOGASTRODUODENOSCOPY (EGD) WITH PROPOFOL
Anesthesia: General

## 2016-08-16 MED ORDER — PROPOFOL 500 MG/50ML IV EMUL
INTRAVENOUS | Status: DC | PRN
  Administered 2016-08-16: 140 ug/kg/min via INTRAVENOUS

## 2016-08-16 MED ORDER — LIDOCAINE HCL (CARDIAC) 20 MG/ML IV SOLN
INTRAVENOUS | Status: DC | PRN
  Administered 2016-08-16: 30 mg via INTRAVENOUS

## 2016-08-16 MED ORDER — SODIUM CHLORIDE 0.9 % IV SOLN: INTRAVENOUS | Status: DC

## 2016-08-16 MED ORDER — SODIUM CHLORIDE 0.9 % IV SOLN
INTRAVENOUS | Status: DC
  Administered 2016-08-16: 1000 mL via INTRAVENOUS

## 2016-08-16 MED ORDER — PROPOFOL 10 MG/ML IV BOLUS
INTRAVENOUS | Status: DC | PRN
  Administered 2016-08-16: 40 mg via INTRAVENOUS

## 2016-08-16 MED ORDER — EPHEDRINE SULFATE 50 MG/ML IJ SOLN
INTRAMUSCULAR | Status: DC | PRN
  Administered 2016-08-16: 5 mg via INTRAVENOUS

## 2016-08-16 NOTE — Transfer of Care (Signed)
Immediate Anesthesia Transfer of Care Note  Patient: Jimmy Obrien  Procedure(s) Performed: Procedure(s): ESOPHAGOGASTRODUODENOSCOPY (EGD) WITH PROPOFOL (N/A)  Patient Location: PACU  Anesthesia Type:General  Level of Consciousness: sedated  Airway & Oxygen Therapy: Patient Spontanous Breathing and Patient connected to nasal cannula oxygen  Post-op Assessment: Report given to RN and Post -op Vital signs reviewed and stable  Post vital signs: Reviewed and stable  Last Vitals:  Vitals:   08/16/16 1403 08/16/16 1529  BP: 100/63 (!) 88/44  Pulse: 62 (!) 53  Resp: 20 14  Temp: 37.5 C     Last Pain:  Vitals:   08/16/16 1529  TempSrc: Tympanic         Complications: No apparent anesthesia complications

## 2016-08-16 NOTE — H&P (Signed)
Primary Care Physician:  Ebbie Ridge, MD Primary Gastroenterologist:  Dr. Vira Agar  Pre-Procedure History & Physical: HPI:  Jimmy Obrien is a 70 y.o. male is here for an endoscopy.   Past Medical History:  Diagnosis Date  . Hx of atrial fibrillation without current medication   . Hypertension   . Hyperthyroidism   . Sleep apnea     Past Surgical History:  Procedure Laterality Date  . CHOLECYSTECTOMY    . COLON RESECTION    . TRANSURETHRAL RESECTION OF PROSTATE      Prior to Admission medications   Medication Sig Start Date End Date Taking? Authorizing Provider  albuterol (PROVENTIL HFA;VENTOLIN HFA) 108 (90 Base) MCG/ACT inhaler Inhale 2 puffs into the lungs every 6 (six) hours as needed for wheezing or shortness of breath.   Yes Historical Provider, MD  aspirin EC 81 MG tablet Take 81 mg by mouth daily.   Yes Historical Provider, MD  atenolol (TENORMIN) 25 MG tablet Take 12.5 mg by mouth daily.   Yes Historical Provider, MD  azelastine (ASTELIN) 0.1 % nasal spray Place 1 spray into both nostrils 2 (two) times daily. Use in each nostril as directed   Yes Historical Provider, MD  budesonide (RHINOCORT AQUA) 32 MCG/ACT nasal spray Place 1 spray into both nostrils daily.   Yes Historical Provider, MD  Cholecalciferol 10000 units CAPS Take by mouth 1 day or 1 dose.   Yes Historical Provider, MD  citalopram (CELEXA) 20 MG tablet Take 20 mg by mouth daily.   Yes Historical Provider, MD  cyanocobalamin 1000 MCG tablet Take 1,000 mcg by mouth daily.   Yes Historical Provider, MD  diazepam (VALIUM) 5 MG tablet Take 5 mg by mouth 2 (two) times daily.   Yes Historical Provider, MD  Fluticasone-Salmeterol (ADVAIR) 100-50 MCG/DOSE AEPB Inhale 1 puff into the lungs 2 (two) times daily.   Yes Historical Provider, MD  levocetirizine (XYZAL) 5 MG tablet Take 5 mg by mouth every evening.   Yes Historical Provider, MD  meloxicam (MOBIC) 7.5 MG tablet Take 7.5 mg by mouth daily.   Yes  Historical Provider, MD  methimazole (TAPAZOLE) 5 MG tablet Take 5 mg by mouth daily.   Yes Historical Provider, MD  Multiple Vitamin (MULTIVITAMIN) tablet Take 1 tablet by mouth daily.   Yes Historical Provider, MD  pantoprazole (PROTONIX) 40 MG tablet Take 40 mg by mouth daily.   Yes Historical Provider, MD  potassium gluconate 595 (99 K) MG TABS tablet Take 595 mg by mouth daily.   Yes Historical Provider, MD  silodosin (RAPAFLO) 8 MG CAPS capsule Take 8 mg by mouth daily with breakfast.   Yes Historical Provider, MD  sucralfate (CARAFATE) 1 g tablet Take 1 g by mouth 4 (four) times daily -  with meals and at bedtime.   Yes Historical Provider, MD    Allergies as of 07/31/2016 - never reviewed  Allergen Reaction Noted  . Ciprofloxacin Other (See Comments) 05/20/2016  . Tetracyclines & related Other (See Comments) 05/20/2016  . Penicillins Rash 05/20/2016  . Sulfa antibiotics Rash 05/20/2016    History reviewed. No pertinent family history.  Social History   Social History  . Marital status: Married    Spouse name: N/A  . Number of children: N/A  . Years of education: N/A   Occupational History  . Not on file.   Social History Main Topics  . Smoking status: Never Smoker  . Smokeless tobacco: Never Used  . Alcohol  use Yes     Comment: socially  . Drug use: No  . Sexual activity: Not on file   Other Topics Concern  . Not on file   Social History Narrative  . No narrative on file    Review of Systems: See HPI, otherwise negative ROS  Physical Exam: BP 100/63   Pulse 62   Temp 99.5 F (37.5 C) (Tympanic)   Resp 20   Ht 5\' 6"  (1.676 m)   Wt 74.8 kg (165 lb)   SpO2 95%   BMI 26.63 kg/m  General:   Alert,  pleasant and cooperative in NAD Head:  Normocephalic and atraumatic. Neck:  Supple; no masses or thyromegaly. Lungs:  Clear throughout to auscultation.    Heart:  Regular rate and rhythm. Abdomen:  Soft, nontender and nondistended. Normal bowel sounds,  without guarding, and without rebound.   Neurologic:  Alert and  oriented x4;  grossly normal neurologically.  Impression/Plan: Jimmy Obrien is here for an endoscopy to be performed for epigastric pain.  Risks, benefits, limitations, and alternatives regarding  endoscopy have been reviewed with the patient.  Questions have been answered.  All parties agreeable.   Gaylyn Cheers, MD  08/16/2016, 3:05 PM

## 2016-08-16 NOTE — Anesthesia Preprocedure Evaluation (Signed)
Anesthesia Evaluation  Patient identified by MRN, date of birth, ID band Patient awake    Reviewed: Allergy & Precautions, NPO status , Patient's Chart, lab work & pertinent test results  Airway Mallampati: II  TM Distance: >3 FB Neck ROM: Full    Dental no notable dental hx.    Pulmonary asthma (mild intermittent) , sleep apnea , neg COPD,    breath sounds clear to auscultation- rhonchi (-) wheezing      Cardiovascular hypertension, Pt. on medications (-) CAD and (-) Past MI + dysrhythmias (hx of paroxysmal afib) Atrial Fibrillation  Rhythm:Regular Rate:Normal - Systolic murmurs and - Diastolic murmurs    Neuro/Psych negative neurological ROS  negative psych ROS   GI/Hepatic negative GI ROS, Neg liver ROS,   Endo/Other  neg diabetesHyperthyroidism   Renal/GU negative Renal ROS     Musculoskeletal negative musculoskeletal ROS (+)   Abdominal (+) - obese,   Peds  Hematology negative hematology ROS (+)   Anesthesia Other Findings Past Medical History: No date: Hx of atrial fibrillation without current medi* No date: Hypertension No date: Hyperthyroidism No date: Sleep apnea   Reproductive/Obstetrics                             Anesthesia Physical Anesthesia Plan  ASA: III  Anesthesia Plan: General   Post-op Pain Management:    Induction: Intravenous  Airway Management Planned: Natural Airway  Additional Equipment:   Intra-op Plan:   Post-operative Plan:   Informed Consent: I have reviewed the patients History and Physical, chart, labs and discussed the procedure including the risks, benefits and alternatives for the proposed anesthesia with the patient or authorized representative who has indicated his/her understanding and acceptance.   Dental advisory given  Plan Discussed with: CRNA and Anesthesiologist  Anesthesia Plan Comments:         Anesthesia Quick  Evaluation

## 2016-08-16 NOTE — Anesthesia Procedure Notes (Signed)
Date/Time: 08/16/2016 3:10 PM Performed by: Johnna Acosta Pre-anesthesia Checklist: Patient identified, Emergency Drugs available, Suction available, Patient being monitored and Timeout performed Patient Re-evaluated:Patient Re-evaluated prior to inductionOxygen Delivery Method: Nasal cannula

## 2016-08-16 NOTE — Anesthesia Postprocedure Evaluation (Signed)
Anesthesia Post Note  Patient: [name]  Procedure(s) Performed: Procedure(s) (LRB): ESOPHAGOGASTRODUODENOSCOPY (EGD) WITH PROPOFOL (N/A)  Patient location during evaluation: Endoscopy Anesthesia Type: General Level of consciousness: awake and alert and oriented Pain management: pain level controlled Vital Signs Assessment: post-procedure vital signs reviewed and stable Respiratory status: spontaneous breathing, nonlabored ventilation and respiratory function stable Cardiovascular status: blood pressure returned to baseline and stable Postop Assessment: no signs of nausea or vomiting Anesthetic complications: no    Last Vitals:  Vitals:   08/16/16 1532 08/16/16 1542  BP: (!) 88/49 (!) 92/47  Pulse:    Resp:    Temp: 36.2 C     Last Pain:  Vitals:   08/16/16 1532  TempSrc: Tympanic                 Jimmy Obrien

## 2016-08-16 NOTE — Op Note (Signed)
[facility] Patient Name: Jimmy Obrien Procedure Date: 08/16/2016 3:07 PM MRN: [id number] Account #: [id number] Date of Birth: [birth year] Admit Type: Outpatient Age: 70 Room: [facility] ENDO ROOM 4 Gender: Male Note Status: Finalized Procedure:            Upper GI endoscopy Indications:          Epigastric abdominal pain Providers:            Manya Silvas, MD Referring MD:         Adrian Prows (Referring MD) Medicines:            Propofol per Anesthesia Complications:        No immediate complications. Procedure:            Pre-Anesthesia Assessment:                       - After reviewing the risks and benefits, the patient                        was deemed in satisfactory condition to undergo the                        procedure.                       After obtaining informed consent, the endoscope was                        passed under direct vision. Throughout the procedure,                        the patient's blood pressure, pulse, and oxygen                        saturations were monitored continuously. The Endoscope                        was introduced through the mouth, and advanced to the                        second part of duodenum. The upper GI endoscopy was                        accomplished without difficulty. The patient tolerated                        the procedure well. Findings:      The Z-line was irregular and was found 40 cm from the incisors.       Esophagus otherwise normal.      Diffuse mild inflammation characterized by erythema and granularity was       found in the gastric body and in the gastric antrum. Mostly in antrum       and distal body. Biopsies were taken with a cold forceps for histology.       Biopsies were taken with a cold forceps for Helicobacter pylori testing.      The examined duodenum was normal. Impression:           - Z-line irregular, 40 cm from the incisors.                       -  Gastritis. Biopsied.                       - Normal examined duodenum. Recommendation:       - Await pathology results. STOP MOBIC, Cut out 9pm                        snack. Manya Silvas, MD 08/16/2016 3:26:49 PM This report has been signed electronically. Number of Addenda: 0 Note Initiated On: 08/16/2016 3:07 PM      [facility]

## 2016-08-17 ENCOUNTER — Encounter: Payer: Self-pay | Admitting: Unknown Physician Specialty

## 2016-08-18 LAB — SURGICAL PATHOLOGY

## 2016-10-09 DIAGNOSIS — E042 Nontoxic multinodular goiter: Secondary | ICD-10-CM | POA: Diagnosis not present

## 2016-10-17 DIAGNOSIS — E0520 Thyrotoxicosis with toxic multinodular goiter without thyrotoxic crisis or storm: Secondary | ICD-10-CM | POA: Diagnosis not present

## 2016-11-01 DIAGNOSIS — N400 Enlarged prostate without lower urinary tract symptoms: Secondary | ICD-10-CM | POA: Diagnosis not present

## 2016-11-01 DIAGNOSIS — I10 Essential (primary) hypertension: Secondary | ICD-10-CM | POA: Diagnosis not present

## 2016-11-01 DIAGNOSIS — G4730 Sleep apnea, unspecified: Secondary | ICD-10-CM | POA: Insufficient documentation

## 2016-11-01 DIAGNOSIS — I480 Paroxysmal atrial fibrillation: Secondary | ICD-10-CM | POA: Diagnosis not present

## 2016-11-01 DIAGNOSIS — Z8639 Personal history of other endocrine, nutritional and metabolic disease: Secondary | ICD-10-CM | POA: Diagnosis not present

## 2016-12-05 DIAGNOSIS — G4719 Other hypersomnia: Secondary | ICD-10-CM | POA: Diagnosis not present

## 2016-12-05 DIAGNOSIS — J310 Chronic rhinitis: Secondary | ICD-10-CM | POA: Diagnosis not present

## 2016-12-05 DIAGNOSIS — I480 Paroxysmal atrial fibrillation: Secondary | ICD-10-CM | POA: Diagnosis not present

## 2016-12-05 DIAGNOSIS — G4733 Obstructive sleep apnea (adult) (pediatric): Secondary | ICD-10-CM | POA: Diagnosis not present

## 2016-12-05 DIAGNOSIS — R5383 Other fatigue: Secondary | ICD-10-CM | POA: Diagnosis not present

## 2016-12-12 DIAGNOSIS — R350 Frequency of micturition: Secondary | ICD-10-CM | POA: Diagnosis not present

## 2016-12-12 DIAGNOSIS — N5201 Erectile dysfunction due to arterial insufficiency: Secondary | ICD-10-CM | POA: Diagnosis not present

## 2016-12-12 DIAGNOSIS — N401 Enlarged prostate with lower urinary tract symptoms: Secondary | ICD-10-CM | POA: Diagnosis not present

## 2016-12-12 DIAGNOSIS — R3914 Feeling of incomplete bladder emptying: Secondary | ICD-10-CM | POA: Diagnosis not present

## 2017-01-05 ENCOUNTER — Other Ambulatory Visit: Admit: 2017-01-05 | Discharge: 2017-01-05 | Payer: MEDICARE | Primary: Internal Medicine

## 2017-01-05 DIAGNOSIS — C61 Malignant neoplasm of prostate: Secondary | ICD-10-CM

## 2017-01-06 LAB — PSA, ULTRASENSITIVE: PSA, ULTRASENSITIVE: 0.069 ng/mL (ref 0.000–4.000)

## 2017-01-12 ENCOUNTER — Ambulatory Visit: Admit: 2017-01-12 | Discharge: 2017-01-12 | Payer: MEDICARE | Attending: Urology | Primary: Internal Medicine

## 2017-01-12 DIAGNOSIS — C61 Malignant neoplasm of prostate: Secondary | ICD-10-CM

## 2017-01-12 LAB — AMB POC URINALYSIS DIP STICK AUTO W/ MICRO (PGU)
Bilirubin (UA POC): NEGATIVE
Blood (UA POC): NEGATIVE
Glucose (UA POC): NEGATIVE mg/dL
Ketones (UA POC): NEGATIVE
Leukocyte esterase (UA POC): NEGATIVE
Nitrites (UA POC): NEGATIVE
Protein (UA POC): NEGATIVE
Specific gravity (UA POC): 1.02 (ref 1.001–1.035)
Urobilinogen (POC): 0.2
pH (UA POC): 6 (ref 4.6–8.0)

## 2017-01-12 NOTE — Progress Notes (Signed)
[facility]  [address] [place] [address]  [phone]    Jason Fernandez  DOB: 03/07/1946     HPI   71 y.o., male returns in follow up for CaP and BPH. ??Reports stable noct times one.  Diagnosed with Staph UTI at last visit but denies any recurrent UTI's since. 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 Latest PSA is 0.069 on 01/05/17 which remains stable. Denies any hematuria.  PVR was 19cc at last visit.      Past Medical History:   Diagnosis Date   ??? Hypercholesterolemia    ??? Hypertension    ??? Personal history of prostate cancer      Past Surgical History:   Procedure Laterality Date   ??? HX OTHER SURGICAL      brachytherapy 3/04     Current Outpatient Prescriptions   Medication Sig Dispense Refill   ??? rosuvastatin (CRESTOR) 40 mg tablet Take 1 Tab by mouth nightly. 30 Tab 1   ??? aspirin delayed-release 81 mg tablet Take 4 Tabs by mouth daily. 30 Tab 1   ??? amLODIPine (NORVASC) 5 mg tablet      ??? metFORMIN ER (GLUCOPHAGE XR) 500 mg tablet      ??? tamsulosin (FLOMAX) 0.4 mg capsule      ??? Cholecalciferol, Vitamin D3, (VITAMIN D3) 2,000 unit cap capsule Take  by mouth two (2) times a day.       Allergies   Allergen Reactions   ??? Lisinopril Hives   ??? Meloxicam Hives   ??? Oxaprozin Rash   ??? Sulfa (Sulfonamide Antibiotics) Rash     Social History     Social History   ??? Marital status: MARRIED     Spouse name: N/A   ??? Number of children: N/A   ??? Years of education: N/A     Occupational History   ??? Not on file.     Social History Main Topics   ??? Smoking status: Never Smoker   ??? Smokeless tobacco: Not on file   ??? Alcohol use No   ??? Drug use: Not on file   ??? Sexual activity: Not on file     Other Topics Concern   ??? Not on file     Social History Narrative     History reviewed. No pertinent family history.    Review of Systems  All systems reviewed and are negative at this time.    Physical Exam  Visit Vitals   ??? BP 160/86   ??? Pulse (!) 52   ??? Ht 5\' 10"  (1.778 m)    ??? Wt 197 lb (89.4 kg)   ??? BMI 28.27 kg/m2     General appearance - alert, well appearing, and in no distress  Mental status - alert, oriented to person, place, and time  Eyes - extraocular eye movements intact, sclera anicteric  Abdomen - soft, nontender, nondistended, no masses or organomegaly  Neurological -  normal speech, no focal findings or movement disorder noted  Skin - normal coloration and turgor      Urinalysis  UA - Dipstick  Results for orders placed or performed in visit on 01/12/17   AMB POC URINALYSIS DIP STICK AUTO W/ MICRO (PGU)     Status: None   Result Value Ref Range Status    Glucose (UA POC) Negative Negative mg/dL Final    Bilirubin (UA POC) Negative Negative Final    Ketones (UA POC)  Negative Negative Final    Specific gravity (UA POC) 1.020 1.001 - 1.035 Final    Blood (UA POC) Negative Negative Final    pH (UA POC) 6.0 4.6 - 8.0 Final    Protein (UA POC) Negative Negative Final    Urobilinogen (POC) 0.2 mg/dL  Final    Nitrites (UA POC) Negative Negative Final    Leukocyte esterase (UA POC) Negative Negative Final       UA - Micro  WBC - 0  RBC - 0  Bacteria - 0  Epith - 0    Assessment/Plan    ICD-10-CM ICD-9-CM    1. Prostate cancer (HCC) C61 185 AMB POC URINALYSIS DIP STICK AUTO W/ MICRO (PGU)      PSA, ULTRASENSITIVE     Cont Flomax and Proscar.  RTO in 6 mo with PSA prior.  Pt prefers q 6 mo follow up.    Dione Booze Izaan Kingbird, DO

## 2017-01-16 ENCOUNTER — Other Ambulatory Visit: Payer: Self-pay | Admitting: Student

## 2017-01-16 ENCOUNTER — Ambulatory Visit
Admission: RE | Admit: 2017-01-16 | Discharge: 2017-01-16 | Disposition: A | Discharge status: Home / Self Care | Payer: PPO | Source: Ambulatory Visit | Attending: Student | Admitting: Student

## 2017-01-16 DIAGNOSIS — K760 Fatty (change of) liver, not elsewhere classified: Secondary | ICD-10-CM | POA: Insufficient documentation

## 2017-01-16 DIAGNOSIS — Z9049 Acquired absence of other specified parts of digestive tract: Secondary | ICD-10-CM | POA: Insufficient documentation

## 2017-01-16 DIAGNOSIS — K5730 Diverticulosis of large intestine without perforation or abscess without bleeding: Secondary | ICD-10-CM | POA: Diagnosis not present

## 2017-01-16 DIAGNOSIS — I700 Atherosclerosis of aorta: Secondary | ICD-10-CM | POA: Insufficient documentation

## 2017-01-16 DIAGNOSIS — R1031 Right lower quadrant pain: Secondary | ICD-10-CM

## 2017-01-16 DIAGNOSIS — R1013 Epigastric pain: Secondary | ICD-10-CM | POA: Insufficient documentation

## 2017-01-16 LAB — POCT I-STAT CREATININE: CREATININE: 1.1 mg/dL (ref 0.61–1.24)

## 2017-01-16 MED ORDER — IOPAMIDOL (ISOVUE-300) INJECTION 61%
100.0000 mL | Freq: Once | INTRAVENOUS | Status: AC | PRN
  Administered 2017-01-16: 100 mL via INTRAVENOUS

## 2017-01-23 DIAGNOSIS — K2950 Unspecified chronic gastritis without bleeding: Secondary | ICD-10-CM | POA: Diagnosis not present

## 2017-01-23 DIAGNOSIS — R1031 Right lower quadrant pain: Secondary | ICD-10-CM | POA: Diagnosis not present

## 2017-01-23 DIAGNOSIS — K760 Fatty (change of) liver, not elsewhere classified: Secondary | ICD-10-CM | POA: Diagnosis not present

## 2017-02-13 DIAGNOSIS — G4710 Hypersomnia, unspecified: Secondary | ICD-10-CM | POA: Diagnosis not present

## 2017-02-13 DIAGNOSIS — G4733 Obstructive sleep apnea (adult) (pediatric): Secondary | ICD-10-CM | POA: Diagnosis not present

## 2017-04-09 DIAGNOSIS — G4719 Other hypersomnia: Secondary | ICD-10-CM | POA: Diagnosis not present

## 2017-04-09 DIAGNOSIS — G4733 Obstructive sleep apnea (adult) (pediatric): Secondary | ICD-10-CM | POA: Diagnosis not present

## 2017-04-09 DIAGNOSIS — E0520 Thyrotoxicosis with toxic multinodular goiter without thyrotoxic crisis or storm: Secondary | ICD-10-CM | POA: Diagnosis not present

## 2017-04-12 DIAGNOSIS — L821 Other seborrheic keratosis: Secondary | ICD-10-CM | POA: Diagnosis not present

## 2017-04-12 DIAGNOSIS — L400 Psoriasis vulgaris: Secondary | ICD-10-CM | POA: Diagnosis not present

## 2017-04-12 DIAGNOSIS — L578 Other skin changes due to chronic exposure to nonionizing radiation: Secondary | ICD-10-CM | POA: Diagnosis not present

## 2017-04-12 DIAGNOSIS — D229 Melanocytic nevi, unspecified: Secondary | ICD-10-CM | POA: Diagnosis not present

## 2017-04-12 DIAGNOSIS — D1800 Hemangioma unspecified site: Secondary | ICD-10-CM | POA: Diagnosis not present

## 2017-04-12 DIAGNOSIS — L812 Freckles: Secondary | ICD-10-CM | POA: Diagnosis not present

## 2017-04-12 DIAGNOSIS — L609 Nail disorder, unspecified: Secondary | ICD-10-CM | POA: Diagnosis not present

## 2017-04-16 DIAGNOSIS — E0520 Thyrotoxicosis with toxic multinodular goiter without thyrotoxic crisis or storm: Secondary | ICD-10-CM | POA: Diagnosis not present

## 2017-05-02 MED ORDER — FINASTERIDE 5 MG TAB
5 mg | ORAL_TABLET | Freq: Every day | ORAL | 4 refills | Status: DC
Start: 2017-05-02 — End: 2018-02-01

## 2017-05-02 NOTE — Telephone Encounter (Signed)
Pt would like a refill for his finasteride 5 mg sent to the [facility] on [address] please. 90qty with 4 refills please. He sees Dr. Myrna Blazer in November. Thanks!

## 2017-05-02 NOTE — Telephone Encounter (Signed)
Refill sent to pt's pharm. cw

## 2017-05-08 DIAGNOSIS — H1045 Other chronic allergic conjunctivitis: Secondary | ICD-10-CM | POA: Diagnosis not present

## 2017-05-08 DIAGNOSIS — J3089 Other allergic rhinitis: Secondary | ICD-10-CM | POA: Diagnosis not present

## 2017-05-08 DIAGNOSIS — J4530 Mild persistent asthma, uncomplicated: Secondary | ICD-10-CM | POA: Diagnosis not present

## 2017-05-17 DIAGNOSIS — J3089 Other allergic rhinitis: Secondary | ICD-10-CM | POA: Diagnosis not present

## 2017-05-21 DIAGNOSIS — J3089 Other allergic rhinitis: Secondary | ICD-10-CM | POA: Diagnosis not present

## 2017-05-29 DIAGNOSIS — J3089 Other allergic rhinitis: Secondary | ICD-10-CM | POA: Diagnosis not present

## 2017-05-31 DIAGNOSIS — J3089 Other allergic rhinitis: Secondary | ICD-10-CM | POA: Diagnosis not present

## 2017-06-05 DIAGNOSIS — J3089 Other allergic rhinitis: Secondary | ICD-10-CM | POA: Diagnosis not present

## 2017-06-07 DIAGNOSIS — J3089 Other allergic rhinitis: Secondary | ICD-10-CM | POA: Diagnosis not present

## 2017-06-14 DIAGNOSIS — J3089 Other allergic rhinitis: Secondary | ICD-10-CM | POA: Diagnosis not present

## 2017-06-19 DIAGNOSIS — J3089 Other allergic rhinitis: Secondary | ICD-10-CM | POA: Diagnosis not present

## 2017-06-28 DIAGNOSIS — J3089 Other allergic rhinitis: Secondary | ICD-10-CM | POA: Diagnosis not present

## 2017-07-10 DIAGNOSIS — J3089 Other allergic rhinitis: Secondary | ICD-10-CM | POA: Diagnosis not present

## 2017-07-13 ENCOUNTER — Other Ambulatory Visit: Admit: 2017-07-13 | Discharge: 2017-07-13 | Payer: MEDICARE | Primary: Internal Medicine

## 2017-07-13 DIAGNOSIS — C61 Malignant neoplasm of prostate: Secondary | ICD-10-CM

## 2017-07-14 LAB — PSA, ULTRASENSITIVE: PSA, ULTRASENSITIVE: 0.095 ng/mL (ref 0.000–4.000)

## 2017-07-17 DIAGNOSIS — J3089 Other allergic rhinitis: Secondary | ICD-10-CM | POA: Diagnosis not present

## 2017-07-20 ENCOUNTER — Ambulatory Visit: Admit: 2017-07-20 | Discharge: 2017-07-20 | Payer: MEDICARE | Attending: Urology | Primary: Internal Medicine

## 2017-07-20 DIAGNOSIS — C61 Malignant neoplasm of prostate: Secondary | ICD-10-CM

## 2017-07-20 LAB — AMB POC URINALYSIS DIP STICK AUTO W/ MICRO (PGU)
Bilirubin (UA POC): NEGATIVE
Blood (UA POC): NEGATIVE
Glucose (UA POC): NEGATIVE mg/dL
Ketones (UA POC): NEGATIVE
Leukocyte esterase (UA POC): NEGATIVE
Nitrites (UA POC): NEGATIVE
Protein (UA POC): NEGATIVE
Specific gravity (UA POC): 1.02 (ref 1.001–1.035)
Urobilinogen (POC): 0.2
pH (UA POC): 7 (ref 4.6–8.0)

## 2017-07-20 NOTE — Progress Notes (Signed)
[facility]  [address] [place] [address]  [phone]    Jason Fernandez  DOB: 04-20-1946     HPI   71 y.o., male returns in follow up for CaP and BPH. ??Reports stable noct times one on Flomax and Proscar.  Reports less urgency. 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 Latest PSA is 0.095 on 07/13/17.  This was 0.069 on 01/05/17. Denies any hematuria.  PVR was 19cc at prior visit.          Past Medical History:   Diagnosis Date   ??? Hypercholesterolemia    ??? Hypertension    ??? Personal history of prostate cancer      Past Surgical History:   Procedure Laterality Date   ??? HX OTHER SURGICAL      brachytherapy 3/04     Current Outpatient Medications   Medication Sig Dispense Refill   ??? finasteride (PROSCAR) 5 mg tablet Take 1 Tab by mouth daily. 90 Tab 4   ??? rosuvastatin (CRESTOR) 40 mg tablet Take 1 Tab by mouth nightly. 30 Tab 1   ??? aspirin delayed-release 81 mg tablet Take 4 Tabs by mouth daily. 30 Tab 1   ??? amLODIPine (NORVASC) 5 mg tablet      ??? metFORMIN ER (GLUCOPHAGE XR) 500 mg tablet      ??? tamsulosin (FLOMAX) 0.4 mg capsule      ??? Cholecalciferol, Vitamin D3, (VITAMIN D3) 2,000 unit cap capsule Take  by mouth two (2) times a day.       Allergies   Allergen Reactions   ??? Lisinopril Hives   ??? Meloxicam Hives   ??? Oxaprozin Rash   ??? Sulfa (Sulfonamide Antibiotics) Rash     Social History     Socioeconomic History   ??? Marital status: MARRIED     Spouse name: Not on file   ??? Number of children: Not on file   ??? Years of education: Not on file   ??? Highest education level: Not on file   Social Needs   ??? Financial resource strain: Not on file   ??? Food insecurity - worry: Not on file   ??? Food insecurity - inability: Not on file   ??? Transportation needs - medical: Not on file   ??? Transportation needs - non-medical: Not on file   Occupational History   ??? Not on file   Tobacco Use   ??? Smoking status: Never Smoker   Substance and Sexual Activity    ??? Alcohol use: No   ??? Drug use: Not on file   ??? Sexual activity: Not on file   Other Topics Concern   ??? Not on file   Social History Narrative   ??? Not on file     No family history on file.    Review of Systems  All systems reviewed and are negative at this time.    Physical Exam  Visit Vitals  BP 165/87   Pulse (!) 56   Temp 97.9 ??F (36.6 ??C) (Tympanic)   Wt 197 lb (89.4 kg)   BMI 28.27 kg/m??     General appearance - alert, well appearing, and in no distress  Mental status - alert, oriented to person, place, and time  Eyes - extraocular eye movements intact, sclera anicteric  Abdomen - soft, nontender, nondistended, no masses or organomegaly  Neurological -  normal speech, no focal findings or movement disorder noted  Skin - normal coloration and turgor      Urinalysis  UA - Dipstick  Results for orders placed or performed in visit on 07/20/17   AMB POC URINALYSIS DIP STICK AUTO W/ MICRO (PGU)     Status: None   Result Value Ref Range Status    Glucose (UA POC) Negative Negative mg/dL Final    Bilirubin (UA POC) Negative Negative Final    Ketones (UA POC) Negative Negative Final    Specific gravity (UA POC) 1.020 1.001 - 1.035 Final    Blood (UA POC) Negative Negative Final    pH (UA POC) 7 4.6 - 8.0 Final    Protein (UA POC) Negative Negative Final    Urobilinogen (POC) 0.2 mg/dL  Final    Nitrites (UA POC) Negative Negative Final    Leukocyte esterase (UA POC) Negative Negative Final       UA - Micro  WBC - 0  RBC - 0  Bacteria - 0  Epith - 0    Assessment/Plan    ICD-10-CM ICD-9-CM    1. Prostate cancer ([place]) C61 185 AMB POC URINALYSIS DIP STICK AUTO W/ MICRO (PGU)   2. Benign prostatic hyperplasia, unspecified whether lower urinary tract symptoms present N40.0 600.00      RTO in 6 mo for DRE with PSA prior.  Cont Flomax and Proscar.    Dione Booze Dasia Guerrier, DO

## 2017-07-26 DIAGNOSIS — J3089 Other allergic rhinitis: Secondary | ICD-10-CM | POA: Diagnosis not present

## 2017-07-31 DIAGNOSIS — J3089 Other allergic rhinitis: Secondary | ICD-10-CM | POA: Diagnosis not present

## 2017-08-15 DIAGNOSIS — G4710 Hypersomnia, unspecified: Secondary | ICD-10-CM | POA: Diagnosis not present

## 2017-08-15 DIAGNOSIS — G4733 Obstructive sleep apnea (adult) (pediatric): Secondary | ICD-10-CM | POA: Diagnosis not present

## 2017-08-21 DIAGNOSIS — J3089 Other allergic rhinitis: Secondary | ICD-10-CM | POA: Diagnosis not present

## 2017-08-28 DIAGNOSIS — J3089 Other allergic rhinitis: Secondary | ICD-10-CM | POA: Diagnosis not present

## 2017-09-13 DIAGNOSIS — J3089 Other allergic rhinitis: Secondary | ICD-10-CM | POA: Diagnosis not present

## 2017-09-20 DIAGNOSIS — J3089 Other allergic rhinitis: Secondary | ICD-10-CM | POA: Diagnosis not present

## 2017-09-24 DIAGNOSIS — L719 Rosacea, unspecified: Secondary | ICD-10-CM | POA: Diagnosis not present

## 2017-09-24 DIAGNOSIS — L821 Other seborrheic keratosis: Secondary | ICD-10-CM | POA: Diagnosis not present

## 2017-09-24 DIAGNOSIS — L820 Inflamed seborrheic keratosis: Secondary | ICD-10-CM | POA: Diagnosis not present

## 2017-09-24 DIAGNOSIS — L812 Freckles: Secondary | ICD-10-CM | POA: Diagnosis not present

## 2017-09-28 DIAGNOSIS — J3089 Other allergic rhinitis: Secondary | ICD-10-CM | POA: Diagnosis not present

## 2017-10-04 DIAGNOSIS — J3089 Other allergic rhinitis: Secondary | ICD-10-CM | POA: Diagnosis not present

## 2017-10-11 DIAGNOSIS — J3089 Other allergic rhinitis: Secondary | ICD-10-CM | POA: Diagnosis not present

## 2017-10-16 DIAGNOSIS — J3089 Other allergic rhinitis: Secondary | ICD-10-CM | POA: Diagnosis not present

## 2017-10-16 DIAGNOSIS — J301 Allergic rhinitis due to pollen: Secondary | ICD-10-CM | POA: Diagnosis not present

## 2017-10-17 DIAGNOSIS — E0520 Thyrotoxicosis with toxic multinodular goiter without thyrotoxic crisis or storm: Secondary | ICD-10-CM | POA: Diagnosis not present

## 2017-10-24 DIAGNOSIS — E0520 Thyrotoxicosis with toxic multinodular goiter without thyrotoxic crisis or storm: Secondary | ICD-10-CM | POA: Diagnosis not present

## 2017-10-26 DIAGNOSIS — J019 Acute sinusitis, unspecified: Secondary | ICD-10-CM | POA: Diagnosis not present

## 2017-10-26 DIAGNOSIS — B9689 Other specified bacterial agents as the cause of diseases classified elsewhere: Secondary | ICD-10-CM | POA: Diagnosis not present

## 2017-10-26 DIAGNOSIS — J302 Other seasonal allergic rhinitis: Secondary | ICD-10-CM | POA: Diagnosis not present

## 2017-10-26 DIAGNOSIS — J209 Acute bronchitis, unspecified: Secondary | ICD-10-CM | POA: Diagnosis not present

## 2017-11-01 DIAGNOSIS — J3089 Other allergic rhinitis: Secondary | ICD-10-CM | POA: Diagnosis not present

## 2017-11-09 DIAGNOSIS — J3089 Other allergic rhinitis: Secondary | ICD-10-CM | POA: Diagnosis not present

## 2017-11-16 DIAGNOSIS — J3089 Other allergic rhinitis: Secondary | ICD-10-CM | POA: Diagnosis not present

## 2017-11-21 DIAGNOSIS — J3089 Other allergic rhinitis: Secondary | ICD-10-CM | POA: Diagnosis not present

## 2017-11-22 DIAGNOSIS — J3089 Other allergic rhinitis: Secondary | ICD-10-CM | POA: Diagnosis not present

## 2017-11-30 DIAGNOSIS — R6889 Other general symptoms and signs: Secondary | ICD-10-CM | POA: Diagnosis not present

## 2017-11-30 DIAGNOSIS — J40 Bronchitis, not specified as acute or chronic: Secondary | ICD-10-CM | POA: Diagnosis not present

## 2017-11-30 DIAGNOSIS — J111 Influenza due to unidentified influenza virus with other respiratory manifestations: Secondary | ICD-10-CM | POA: Diagnosis not present

## 2017-11-30 DIAGNOSIS — J019 Acute sinusitis, unspecified: Secondary | ICD-10-CM | POA: Diagnosis not present

## 2017-11-30 DIAGNOSIS — B9689 Other specified bacterial agents as the cause of diseases classified elsewhere: Secondary | ICD-10-CM | POA: Diagnosis not present

## 2017-12-12 DIAGNOSIS — N401 Enlarged prostate with lower urinary tract symptoms: Secondary | ICD-10-CM | POA: Diagnosis not present

## 2017-12-12 DIAGNOSIS — N5201 Erectile dysfunction due to arterial insufficiency: Secondary | ICD-10-CM | POA: Diagnosis not present

## 2017-12-14 DIAGNOSIS — J3089 Other allergic rhinitis: Secondary | ICD-10-CM | POA: Diagnosis not present

## 2017-12-18 DIAGNOSIS — J3089 Other allergic rhinitis: Secondary | ICD-10-CM | POA: Diagnosis not present

## 2017-12-20 DIAGNOSIS — J3089 Other allergic rhinitis: Secondary | ICD-10-CM | POA: Diagnosis not present

## 2017-12-25 DIAGNOSIS — J3089 Other allergic rhinitis: Secondary | ICD-10-CM | POA: Diagnosis not present

## 2017-12-27 DIAGNOSIS — J3089 Other allergic rhinitis: Secondary | ICD-10-CM | POA: Diagnosis not present

## 2018-01-01 DIAGNOSIS — J3089 Other allergic rhinitis: Secondary | ICD-10-CM | POA: Diagnosis not present

## 2018-01-08 DIAGNOSIS — J3089 Other allergic rhinitis: Secondary | ICD-10-CM | POA: Diagnosis not present

## 2018-01-15 DIAGNOSIS — J3089 Other allergic rhinitis: Secondary | ICD-10-CM | POA: Diagnosis not present

## 2018-01-22 DIAGNOSIS — R05 Cough: Secondary | ICD-10-CM | POA: Diagnosis not present

## 2018-01-22 DIAGNOSIS — R0781 Pleurodynia: Secondary | ICD-10-CM | POA: Diagnosis not present

## 2018-01-24 DIAGNOSIS — J3089 Other allergic rhinitis: Secondary | ICD-10-CM | POA: Diagnosis not present

## 2018-01-25 ENCOUNTER — Other Ambulatory Visit: Admit: 2018-01-25 | Discharge: 2018-01-25 | Payer: MEDICARE | Primary: Internal Medicine

## 2018-01-25 DIAGNOSIS — C61 Malignant neoplasm of prostate: Secondary | ICD-10-CM

## 2018-01-26 LAB — PSA, ULTRASENSITIVE: PSA, ULTRASENSITIVE: 0.091 ng/mL (ref 0.000–4.000)

## 2018-01-29 DIAGNOSIS — J3089 Other allergic rhinitis: Secondary | ICD-10-CM | POA: Diagnosis not present

## 2018-02-01 ENCOUNTER — Ambulatory Visit: Attending: Urology | Primary: Internal Medicine

## 2018-02-01 ENCOUNTER — Ambulatory Visit: Admit: 2018-02-01 | Discharge: 2018-02-01 | Payer: MEDICARE | Attending: Urology | Primary: Internal Medicine

## 2018-02-01 DIAGNOSIS — C61 Malignant neoplasm of prostate: Secondary | ICD-10-CM

## 2018-02-01 LAB — AMB POC URINALYSIS DIP STICK AUTO W/O MICRO (PGU)
Bilirubin (UA POC): NEGATIVE
Bilirubin, Urine, POC: NEGATIVE
Blood (UA POC): NEGATIVE
Blood (UA POC): NEGATIVE
Glucose (UA POC): NEGATIVE mg/dL
Glucose, Urine, POC: NEGATIVE mg/dL
KETONES, Urine, POC: NEGATIVE
Ketones (UA POC): NEGATIVE
Leukocyte Esterase, Urine, POC: NEGATIVE
Leukocyte esterase (UA POC): NEGATIVE
Nitrite, Urine, POC: NEGATIVE
Nitrites (UA POC): NEGATIVE
Protein (UA POC): NEGATIVE
Protein, Urine, POC: NEGATIVE
Specific Gravity, Urine, POC: 1.02 NA (ref 1.001–1.035)
Specific gravity (UA POC): 1.02 (ref 1.001–1.035)
Urobilinogen (POC): 1
Urobilinogen, POC: 1
pH (UA POC): 6 (ref 4.6–8.0)
pH, Urine, POC: 6 NA (ref 4.6–8.0)

## 2018-02-01 NOTE — Progress Notes (Signed)
[facility]  [address] [place] [address]  [phone]    Jason Fernandez  DOB: 1946-03-06     HPI   71 y.o., male returns in follow up for CaP and BPH. ??Reports stable noct times one on Flomax and Proscar.  Stream slow at times. 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 Latest PSA is 0.091 on 01/25/18.  This was0.095 on 07/13/17. and0.069 on 01/05/17. Denies any hematuria. ??PVR was 19cc at prior visit.          Past Medical History:   Diagnosis Date   ??? Hypercholesterolemia    ??? Hypertension    ??? Personal history of prostate cancer      Past Surgical History:   Procedure Laterality Date   ??? HX OTHER SURGICAL      brachytherapy 3/04     Current Outpatient Medications   Medication Sig Dispense Refill   ??? finasteride (PROSCAR) 5 mg tablet Take 5 mg by mouth daily.     ??? aspirin delayed-release 81 mg tablet Take 4 Tabs by mouth daily. 30 Tab 1   ??? metFORMIN ER (GLUCOPHAGE XR) 500 mg tablet      ??? tamsulosin (FLOMAX) 0.4 mg capsule      ??? Cholecalciferol, Vitamin D3, (VITAMIN D3) 2,000 unit cap capsule Take  by mouth two (2) times a day.       Allergies   Allergen Reactions   ??? Lisinopril Hives   ??? Meloxicam Hives   ??? Oxaprozin Rash   ??? Sulfa (Sulfonamide Antibiotics) Rash     Social History     Socioeconomic History   ??? Marital status: MARRIED     Spouse name: Not on file   ??? Number of children: Not on file   ??? Years of education: Not on file   ??? Highest education level: Not on file   Occupational History   ??? Not on file   Social Needs   ??? Financial resource strain: Not on file   ??? Food insecurity:     Worry: Not on file     Inability: Not on file   ??? Transportation needs:     Medical: Not on file     Non-medical: Not on file   Tobacco Use   ??? Smoking status: Never Smoker   Substance and Sexual Activity   ??? Alcohol use: No   ??? Drug use: Not on file   ??? Sexual activity: Not on file   Lifestyle   ??? Physical activity:     Days per week: Not on file      Minutes per session: Not on file   ??? Stress: Not on file   Relationships   ??? Social connections:     Talks on phone: Not on file     Gets together: Not on file     Attends religious service: Not on file     Active member of club or organization: Not on file     Attends meetings of clubs or organizations: Not on file     Relationship status: Not on file   ??? Intimate partner violence:     Fear of current or ex partner: Not on file     Emotionally abused: Not on file     Physically abused: Not on file     Forced sexual activity: Not on file   Other Topics Concern   ??? Not on file  Social History Narrative   ??? Not on file     No family history on file.    Review of Systems  All systems reviewed and are negative at this time.    Physical Exam  Visit Vitals  BP 163/85   Pulse (!) 57   Temp 98.1 ??F (36.7 ??C) (Oral)   Wt 195 lb (88.5 kg)   BMI 27.98 kg/m??     General appearance - alert, well appearing, and in no distress  Mental status - alert, oriented to person, place, and time  Eyes - extraocular eye movements intact, sclera anicteric  DRE- anodular prostate  Neurological -  normal speech, no focal findings or movement disorder noted  Skin - normal coloration and turgor      Urinalysis  UA - Dipstick  Results for orders placed or performed in visit on 02/01/18   AMB POC URINALYSIS DIP STICK AUTO W/O MICRO (PGU)     Status: None   Result Value Ref Range Status    Glucose (UA POC) Negative Negative mg/dL Final    Bilirubin (UA POC) Negative Negative Final    Ketones (UA POC) Negative Negative Final    Specific gravity (UA POC) 1.020 1.001 - 1.035 Final    Blood (UA POC) Negative Negative Final    pH (UA POC) 6 4.6 - 8.0 Final    Protein (UA POC) Negative Negative Final    Urobilinogen (POC) 1 mg/dL  Final    Nitrites (UA POC) Negative Negative Final    Leukocyte esterase (UA POC) Negative Negative Final   Results for orders placed or performed in visit on 07/20/17   AMB POC URINALYSIS DIP STICK AUTO W/ MICRO (PGU)      Status: None   Result Value Ref Range Status    Glucose (UA POC) Negative Negative mg/dL Final    Bilirubin (UA POC) Negative Negative Final    Ketones (UA POC) Negative Negative Final    Specific gravity (UA POC) 1.020 1.001 - 1.035 Final    Blood (UA POC) Negative Negative Final    pH (UA POC) 7 4.6 - 8.0 Final    Protein (UA POC) Negative Negative Final    Urobilinogen (POC) 0.2 mg/dL  Final    Nitrites (UA POC) Negative Negative Final    Leukocyte esterase (UA POC) Negative Negative Final       UA - Micro  WBC - 0  RBC - 0  Bacteria - 0  Epith - 0    Assessment/Plan    ICD-10-CM ICD-9-CM    1. Prostate cancer (HCC) C61 185 AMB POC URINALYSIS DIP STICK AUTO W/O MICRO (PGU)      PSA, DIAGNOSTIC (PROSTATE SPECIFIC AG)   2. Benign prostatic hyperplasia, unspecified whether lower urinary tract symptoms present N40.0 600.00      Cont Flomax and Proscar.  RTO in 6 mo with PSA prior.  If stable, we will extend visits to yearly.    Dione Booze Oluwadarasimi Favor, DO

## 2018-02-01 NOTE — Progress Notes (Signed)
[facility]  [address] [place] [address]  [phone]    Jason Fernandez  DOB: 08/19/1946     HPI   72 y.o., male returns in follow up for CaP and BPH. ??Reports stable noct times one on Flomax and Proscar.  Stream slow at times. 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 Latest PSA is 0.091 on 01/25/18.  This was0.095 on 07/13/17. and0.069 on 01/05/17. Denies any hematuria. ??PVR was 19cc at prior visit.          Past Medical History:   Diagnosis Date   ??? Hypercholesterolemia    ??? Hypertension    ??? Personal history of prostate cancer      Past Surgical History:   Procedure Laterality Date   ??? HX OTHER SURGICAL      brachytherapy 3/04     Current Outpatient Medications   Medication Sig Dispense Refill   ??? finasteride (PROSCAR) 5 mg tablet Take 5 mg by mouth daily.     ??? aspirin delayed-release 81 mg tablet Take 4 Tabs by mouth daily. 30 Tab 1   ??? metFORMIN ER (GLUCOPHAGE XR) 500 mg tablet      ??? tamsulosin (FLOMAX) 0.4 mg capsule      ??? Cholecalciferol, Vitamin D3, (VITAMIN D3) 2,000 unit cap capsule Take  by mouth two (2) times a day.       Allergies   Allergen Reactions   ??? Lisinopril Hives   ??? Meloxicam Hives   ??? Oxaprozin Rash   ??? Sulfa (Sulfonamide Antibiotics) Rash     Social History     Socioeconomic History   ??? Marital status: MARRIED     Spouse name: Not on file   ??? Number of children: Not on file   ??? Years of education: Not on file   ??? Highest education level: Not on file   Occupational History   ??? Not on file   Social Needs   ??? Financial resource strain: Not on file   ??? Food insecurity:     Worry: Not on file     Inability: Not on file   ??? Transportation needs:     Medical: Not on file     Non-medical: Not on file   Tobacco Use   ??? Smoking status: Never Smoker   Substance and Sexual Activity   ??? Alcohol use: No   ??? Drug use: Not on file   ??? Sexual activity: Not on file   Lifestyle   ??? Physical activity:     Days per week: Not on file      Minutes per session: Not on file   ??? Stress: Not on file   Relationships   ??? Social connections:     Talks on phone: Not on file     Gets together: Not on file     Attends religious service: Not on file     Active member of club or organization: Not on file     Attends meetings of clubs or organizations: Not on file     Relationship status: Not on file   ??? Intimate partner violence:     Fear of current or ex partner: Not on file     Emotionally abused: Not on file     Physically abused: Not on file     Forced sexual activity: Not on file   Other Topics Concern   ??? Not on file  Social History Narrative   ??? Not on file     No family history on file.    Review of Systems  All systems reviewed and are negative at this time.    Physical Exam  Visit Vitals  BP 163/85   Pulse (!) 57   Temp 98.1 ??F (36.7 ??C) (Oral)   Wt 195 lb (88.5 kg)   BMI 27.98 kg/m??     General appearance - alert, well appearing, and in no distress  Mental status - alert, oriented to person, place, and time  Eyes - extraocular eye movements intact, sclera anicteric  DRE- anodular prostate  Neurological -  normal speech, no focal findings or movement disorder noted  Skin - normal coloration and turgor      Urinalysis  UA - Dipstick  Results for orders placed or performed in visit on 02/01/18   AMB POC URINALYSIS DIP STICK AUTO W/O MICRO (PGU)     Status: None   Result Value Ref Range Status    Glucose (UA POC) Negative Negative mg/dL Final    Bilirubin (UA POC) Negative Negative Final    Ketones (UA POC) Negative Negative Final    Specific gravity (UA POC) 1.020 1.001 - 1.035 Final    Blood (UA POC) Negative Negative Final    pH (UA POC) 6 4.6 - 8.0 Final    Protein (UA POC) Negative Negative Final    Urobilinogen (POC) 1 mg/dL  Final    Nitrites (UA POC) Negative Negative Final    Leukocyte esterase (UA POC) Negative Negative Final   Results for orders placed or performed in visit on 07/20/17    AMB POC URINALYSIS DIP STICK AUTO W/ MICRO (PGU)     Status: None   Result Value Ref Range Status    Glucose (UA POC) Negative Negative mg/dL Final    Bilirubin (UA POC) Negative Negative Final    Ketones (UA POC) Negative Negative Final    Specific gravity (UA POC) 1.020 1.001 - 1.035 Final    Blood (UA POC) Negative Negative Final    pH (UA POC) 7 4.6 - 8.0 Final    Protein (UA POC) Negative Negative Final    Urobilinogen (POC) 0.2 mg/dL  Final    Nitrites (UA POC) Negative Negative Final    Leukocyte esterase (UA POC) Negative Negative Final       UA - Micro  WBC - 0  RBC - 0  Bacteria - 0  Epith - 0    Assessment/Plan    ICD-10-CM ICD-9-CM    1. Prostate cancer (HCC) C61 185 AMB POC URINALYSIS DIP STICK AUTO W/O MICRO (PGU)      PSA, DIAGNOSTIC (PROSTATE SPECIFIC AG)   2. Benign prostatic hyperplasia, unspecified whether lower urinary tract symptoms present N40.0 600.00      Cont Flomax and Proscar.  RTO in 6 mo with PSA prior.  If stable, we will extend visits to yearly.    Dione Booze Brixon Zhen, DO

## 2018-02-05 DIAGNOSIS — J3089 Other allergic rhinitis: Secondary | ICD-10-CM | POA: Diagnosis not present

## 2018-02-12 DIAGNOSIS — J301 Allergic rhinitis due to pollen: Secondary | ICD-10-CM | POA: Diagnosis not present

## 2018-02-12 DIAGNOSIS — J3089 Other allergic rhinitis: Secondary | ICD-10-CM | POA: Diagnosis not present

## 2018-02-14 DIAGNOSIS — M79602 Pain in left arm: Secondary | ICD-10-CM | POA: Diagnosis not present

## 2018-02-14 DIAGNOSIS — M79601 Pain in right arm: Secondary | ICD-10-CM | POA: Diagnosis not present

## 2018-03-07 DIAGNOSIS — S62632A Displaced fracture of distal phalanx of right middle finger, initial encounter for closed fracture: Secondary | ICD-10-CM | POA: Diagnosis not present

## 2018-03-21 DIAGNOSIS — J310 Chronic rhinitis: Secondary | ICD-10-CM | POA: Diagnosis not present

## 2018-03-21 DIAGNOSIS — G4733 Obstructive sleep apnea (adult) (pediatric): Secondary | ICD-10-CM | POA: Diagnosis not present

## 2018-03-21 DIAGNOSIS — K219 Gastro-esophageal reflux disease without esophagitis: Secondary | ICD-10-CM | POA: Diagnosis not present

## 2018-04-02 DIAGNOSIS — K2950 Unspecified chronic gastritis without bleeding: Secondary | ICD-10-CM | POA: Diagnosis not present

## 2018-04-02 DIAGNOSIS — K760 Fatty (change of) liver, not elsewhere classified: Secondary | ICD-10-CM | POA: Diagnosis not present

## 2018-04-02 DIAGNOSIS — K5900 Constipation, unspecified: Secondary | ICD-10-CM | POA: Diagnosis not present

## 2018-04-02 DIAGNOSIS — K219 Gastro-esophageal reflux disease without esophagitis: Secondary | ICD-10-CM | POA: Diagnosis not present

## 2018-04-11 DIAGNOSIS — L219 Seborrheic dermatitis, unspecified: Secondary | ICD-10-CM | POA: Diagnosis not present

## 2018-04-11 DIAGNOSIS — Z1283 Encounter for screening for malignant neoplasm of skin: Secondary | ICD-10-CM | POA: Diagnosis not present

## 2018-04-11 DIAGNOSIS — I781 Nevus, non-neoplastic: Secondary | ICD-10-CM | POA: Diagnosis not present

## 2018-04-11 DIAGNOSIS — D692 Other nonthrombocytopenic purpura: Secondary | ICD-10-CM | POA: Diagnosis not present

## 2018-04-11 DIAGNOSIS — R7303 Prediabetes: Secondary | ICD-10-CM | POA: Insufficient documentation

## 2018-04-11 DIAGNOSIS — L719 Rosacea, unspecified: Secondary | ICD-10-CM | POA: Diagnosis not present

## 2018-04-11 DIAGNOSIS — I8393 Asymptomatic varicose veins of bilateral lower extremities: Secondary | ICD-10-CM | POA: Diagnosis not present

## 2018-04-11 DIAGNOSIS — Z8639 Personal history of other endocrine, nutritional and metabolic disease: Secondary | ICD-10-CM | POA: Insufficient documentation

## 2018-04-11 DIAGNOSIS — L820 Inflamed seborrheic keratosis: Secondary | ICD-10-CM | POA: Diagnosis not present

## 2018-04-11 DIAGNOSIS — L400 Psoriasis vulgaris: Secondary | ICD-10-CM | POA: Diagnosis not present

## 2018-04-11 DIAGNOSIS — L578 Other skin changes due to chronic exposure to nonionizing radiation: Secondary | ICD-10-CM | POA: Diagnosis not present

## 2018-04-16 DIAGNOSIS — K760 Fatty (change of) liver, not elsewhere classified: Secondary | ICD-10-CM | POA: Diagnosis not present

## 2018-04-17 DIAGNOSIS — S62632D Displaced fracture of distal phalanx of right middle finger, subsequent encounter for fracture with routine healing: Secondary | ICD-10-CM | POA: Diagnosis not present

## 2018-04-23 DIAGNOSIS — E0520 Thyrotoxicosis with toxic multinodular goiter without thyrotoxic crisis or storm: Secondary | ICD-10-CM | POA: Diagnosis not present

## 2018-04-24 DIAGNOSIS — Z23 Encounter for immunization: Secondary | ICD-10-CM | POA: Diagnosis not present

## 2018-05-09 DIAGNOSIS — J4530 Mild persistent asthma, uncomplicated: Secondary | ICD-10-CM | POA: Diagnosis not present

## 2018-05-09 DIAGNOSIS — J3089 Other allergic rhinitis: Secondary | ICD-10-CM | POA: Diagnosis not present

## 2018-07-22 DIAGNOSIS — R109 Unspecified abdominal pain: Secondary | ICD-10-CM | POA: Diagnosis not present

## 2018-07-25 DIAGNOSIS — K5792 Diverticulitis of intestine, part unspecified, without perforation or abscess without bleeding: Secondary | ICD-10-CM | POA: Diagnosis not present

## 2018-07-26 ENCOUNTER — Other Ambulatory Visit: Admit: 2018-07-26 | Payer: MEDICARE | Primary: Internal Medicine

## 2018-07-26 DIAGNOSIS — C61 Malignant neoplasm of prostate: Secondary | ICD-10-CM

## 2018-07-26 NOTE — Progress Notes (Signed)
Patient bloodwork drawn per orders of Dr. Myrna Blazer. Blood drawn without incident.

## 2018-07-27 LAB — PSA PROSTATIC SPECIFIC ANTIGEN: PSA: 0.1 ng/mL (ref 0.0–4.0)

## 2018-07-27 LAB — PSA, DIAGNOSTIC (PROSTATE SPECIFIC AG): Prostate Specific Ag: 0.1 ng/mL (ref 0.0–4.0)

## 2018-08-02 ENCOUNTER — Ambulatory Visit: Attending: Urology | Primary: Internal Medicine

## 2018-08-02 ENCOUNTER — Ambulatory Visit: Admit: 2018-08-02 | Discharge: 2018-08-02 | Payer: MEDICARE | Attending: Urology | Primary: Internal Medicine

## 2018-08-02 DIAGNOSIS — C61 Malignant neoplasm of prostate: Secondary | ICD-10-CM

## 2018-08-02 LAB — AMB POC URINALYSIS DIP STICK AUTO W/O MICRO (PGU)
Bilirubin (UA POC): NEGATIVE
Bilirubin, Urine, POC: NEGATIVE
Blood (UA POC): NEGATIVE
Blood (UA POC): NEGATIVE
Glucose (UA POC): NEGATIVE mg/dL
Glucose, Urine, POC: NEGATIVE mg/dL
KETONES, Urine, POC: NEGATIVE
Ketones (UA POC): NEGATIVE
Leukocyte Esterase, Urine, POC: NEGATIVE
Leukocyte esterase (UA POC): NEGATIVE
Nitrite, Urine, POC: NEGATIVE
Nitrites (UA POC): NEGATIVE
Protein (UA POC): NEGATIVE
Protein, Urine, POC: NEGATIVE
Specific Gravity, Urine, POC: 1.015 NA (ref 1.001–1.035)
Specific gravity (UA POC): 1.015 (ref 1.001–1.035)
Urobilinogen (POC): 0.2
Urobilinogen, POC: 0.2
pH (UA POC): 7 (ref 4.6–8.0)
pH, Urine, POC: 7 NA (ref 4.6–8.0)

## 2018-08-02 NOTE — Progress Notes (Signed)
[facility] [place] [address]  [phone]    Jason Fernandez  DOB: 1946-08-03     HPI   72 y.o., male returns in follow up for CaP and BPH. ??Reports stable noct times 2 on Flomax and Proscar. ??Stream slow at times. 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 PSA is 0.091 on 01/25/18 and is now 0.1 on 07/26/18.  This was 0.095 on 07/13/17. and0.069 on 01/05/17. Denies any hematuria. ??PVR was 19cc at??prior??visit.      Past Medical History:   Diagnosis Date   ??? Hypercholesterolemia    ??? Hypertension    ??? Personal history of prostate cancer      Past Surgical History:   Procedure Laterality Date   ??? HX OTHER SURGICAL      brachytherapy 3/04     Current Outpatient Medications   Medication Sig Dispense Refill   ??? finasteride (PROSCAR) 5 mg tablet Take 5 mg by mouth daily.     ??? aspirin delayed-release 81 mg tablet Take 4 Tabs by mouth daily. 30 Tab 1   ??? metFORMIN ER (GLUCOPHAGE XR) 500 mg tablet      ??? tamsulosin (FLOMAX) 0.4 mg capsule      ??? Cholecalciferol, Vitamin D3, (VITAMIN D3) 2,000 unit cap capsule Take  by mouth two (2) times a day.       Allergies   Allergen Reactions   ??? Lisinopril Hives   ??? Meloxicam Hives   ??? Oxaprozin Rash   ??? Sulfa (Sulfonamide Antibiotics) Rash     Social History     Socioeconomic History   ??? Marital status: MARRIED     Spouse name: Not on file   ??? Number of children: Not on file   ??? Years of education: Not on file   ??? Highest education level: Not on file   Occupational History   ??? Not on file   Social Needs   ??? Financial resource strain: Not on file   ??? Food insecurity:     Worry: Not on file     Inability: Not on file   ??? Transportation needs:     Medical: Not on file     Non-medical: Not on file   Tobacco Use   ??? Smoking status: Never Smoker   Substance and Sexual Activity   ??? Alcohol use: No   ??? Drug use: Not on file   ??? Sexual activity: Not on file   Lifestyle   ??? Physical activity:     Days per week:  Not on file     Minutes per session: Not on file   ??? Stress: Not on file   Relationships   ??? Social connections:     Talks on phone: Not on file     Gets together: Not on file     Attends religious service: Not on file     Active member of club or organization: Not on file     Attends meetings of clubs or organizations: Not on file     Relationship status: Not on file   ??? Intimate partner violence:     Fear of current or ex partner: Not on file     Emotionally abused: Not on file     Physically abused: Not on file     Forced sexual activity: Not on file   Other Topics Concern   ??? Not on file   Social History  Narrative   ??? Not on file     No family history on file.    Review of Systems  All systems reviewed and are negative at this time.    Physical Exam  Visit Vitals  BP 176/79   Pulse (!) 50   Temp 98.2 ??F (36.8 ??C) (Oral)   Ht 5\' 10"  (1.778 m)   Wt 191 lb (86.6 kg)   BMI 27.41 kg/m??     General appearance - alert, well appearing, and in no distress  Mental status - alert, oriented to person, place, and time  Eyes - extraocular eye movements intact, sclera anicteric  Abdomen - soft, nontender, nondistended, no masses or organomegaly  Neurological -  normal speech, no focal findings or movement disorder noted  Skin - normal coloration and turgor      Urinalysis  UA - Dipstick  Results for orders placed or performed in visit on 08/02/18   AMB POC URINALYSIS DIP STICK AUTO W/O MICRO (PGU)     Status: None   Result Value Ref Range Status    Glucose (UA POC) Negative Negative mg/dL Final    Bilirubin (UA POC) Negative Negative Final    Ketones (UA POC) Negative Negative Final    Specific gravity (UA POC) 1.015 1.001 - 1.035 Final    Blood (UA POC) Negative Negative Final    pH (UA POC) 7.0 4.6 - 8.0 Final    Protein (UA POC) Negative Negative Final    Urobilinogen (POC) 0.2 mg/dL  Final    Nitrites (UA POC) Negative Negative Final    Leukocyte esterase (UA POC) Negative Negative Final   Results for orders placed or  performed in visit on 07/20/17   AMB POC URINALYSIS DIP STICK AUTO W/ MICRO (PGU)     Status: None   Result Value Ref Range Status    Glucose (UA POC) Negative Negative mg/dL Final    Bilirubin (UA POC) Negative Negative Final    Ketones (UA POC) Negative Negative Final    Specific gravity (UA POC) 1.020 1.001 - 1.035 Final    Blood (UA POC) Negative Negative Final    pH (UA POC) 7 4.6 - 8.0 Final    Protein (UA POC) Negative Negative Final    Urobilinogen (POC) 0.2 mg/dL  Final    Nitrites (UA POC) Negative Negative Final    Leukocyte esterase (UA POC) Negative Negative Final       UA - Micro  WBC - 0  RBC - 0  Bacteria - 0  Epith - 0    Assessment/Plan    ICD-10-CM ICD-9-CM    1. Prostate cancer (HCC) C61 185 AMB POC URINALYSIS DIP STICK AUTO W/O MICRO (PGU)      PSA, ULTRASENSITIVE   2. Benign prostatic hyperplasia, unspecified whether lower urinary tract symptoms present N40.0 600.00      RTO in 12 mo with PSA prior.  Cont Flomax and Proscar.    Dione Booze Yaniel Limbaugh, DO

## 2018-08-02 NOTE — Progress Notes (Signed)
[facility] [place] [address]  [phone]    Jason Fernandez  DOB: 1946/04/01     HPI   72 y.o., male returns in follow up for CaP and BPH. ??Reports stable noct times 2 on Flomax and Proscar. ??Stream slow at times. 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 PSA is 0.091 on 01/25/18 and is now 0.1 on 07/26/18.  This was 0.095 on 07/13/17. and0.069 on 01/05/17. Denies any hematuria. ??PVR was 19cc at??prior??visit.      Past Medical History:   Diagnosis Date   ??? Hypercholesterolemia    ??? Hypertension    ??? Personal history of prostate cancer      Past Surgical History:   Procedure Laterality Date   ??? HX OTHER SURGICAL      brachytherapy 3/04     Current Outpatient Medications   Medication Sig Dispense Refill   ??? finasteride (PROSCAR) 5 mg tablet Take 5 mg by mouth daily.     ??? aspirin delayed-release 81 mg tablet Take 4 Tabs by mouth daily. 30 Tab 1   ??? metFORMIN ER (GLUCOPHAGE XR) 500 mg tablet      ??? tamsulosin (FLOMAX) 0.4 mg capsule      ??? Cholecalciferol, Vitamin D3, (VITAMIN D3) 2,000 unit cap capsule Take  by mouth two (2) times a day.       Allergies   Allergen Reactions   ??? Lisinopril Hives   ??? Meloxicam Hives   ??? Oxaprozin Rash   ??? Sulfa (Sulfonamide Antibiotics) Rash     Social History     Socioeconomic History   ??? Marital status: MARRIED     Spouse name: Not on file   ??? Number of children: Not on file   ??? Years of education: Not on file   ??? Highest education level: Not on file   Occupational History   ??? Not on file   Social Needs   ??? Financial resource strain: Not on file   ??? Food insecurity:     Worry: Not on file     Inability: Not on file   ??? Transportation needs:     Medical: Not on file     Non-medical: Not on file   Tobacco Use   ??? Smoking status: Never Smoker   Substance and Sexual Activity   ??? Alcohol use: No   ??? Drug use: Not on file   ??? Sexual activity: Not on file   Lifestyle   ??? Physical activity:      Days per week: Not on file     Minutes per session: Not on file   ??? Stress: Not on file   Relationships   ??? Social connections:     Talks on phone: Not on file     Gets together: Not on file     Attends religious service: Not on file     Active member of club or organization: Not on file     Attends meetings of clubs or organizations: Not on file     Relationship status: Not on file   ??? Intimate partner violence:     Fear of current or ex partner: Not on file     Emotionally abused: Not on file     Physically abused: Not on file     Forced sexual activity: Not on file   Other Topics Concern   ??? Not on file   Social History  Narrative   ??? Not on file     No family history on file.    Review of Systems  All systems reviewed and are negative at this time.    Physical Exam  Visit Vitals  BP 176/79   Pulse (!) 50   Temp 98.2 ??F (36.8 ??C) (Oral)   Ht 5\' 10"  (1.778 m)   Wt 191 lb (86.6 kg)   BMI 27.41 kg/m??     General appearance - alert, well appearing, and in no distress  Mental status - alert, oriented to person, place, and time  Eyes - extraocular eye movements intact, sclera anicteric  Abdomen - soft, nontender, nondistended, no masses or organomegaly  Neurological -  normal speech, no focal findings or movement disorder noted  Skin - normal coloration and turgor      Urinalysis  UA - Dipstick  Results for orders placed or performed in visit on 08/02/18   AMB POC URINALYSIS DIP STICK AUTO W/O MICRO (PGU)     Status: None   Result Value Ref Range Status    Glucose (UA POC) Negative Negative mg/dL Final    Bilirubin (UA POC) Negative Negative Final    Ketones (UA POC) Negative Negative Final    Specific gravity (UA POC) 1.015 1.001 - 1.035 Final    Blood (UA POC) Negative Negative Final    pH (UA POC) 7.0 4.6 - 8.0 Final    Protein (UA POC) Negative Negative Final    Urobilinogen (POC) 0.2 mg/dL  Final    Nitrites (UA POC) Negative Negative Final    Leukocyte esterase (UA POC) Negative Negative Final    Results for orders placed or performed in visit on 07/20/17   AMB POC URINALYSIS DIP STICK AUTO W/ MICRO (PGU)     Status: None   Result Value Ref Range Status    Glucose (UA POC) Negative Negative mg/dL Final    Bilirubin (UA POC) Negative Negative Final    Ketones (UA POC) Negative Negative Final    Specific gravity (UA POC) 1.020 1.001 - 1.035 Final    Blood (UA POC) Negative Negative Final    pH (UA POC) 7 4.6 - 8.0 Final    Protein (UA POC) Negative Negative Final    Urobilinogen (POC) 0.2 mg/dL  Final    Nitrites (UA POC) Negative Negative Final    Leukocyte esterase (UA POC) Negative Negative Final       UA - Micro  WBC - 0  RBC - 0  Bacteria - 0  Epith - 0    Assessment/Plan    ICD-10-CM ICD-9-CM    1. Prostate cancer (HCC) C61 185 AMB POC URINALYSIS DIP STICK AUTO W/O MICRO (PGU)      PSA, ULTRASENSITIVE   2. Benign prostatic hyperplasia, unspecified whether lower urinary tract symptoms present N40.0 600.00      RTO in 12 mo with PSA prior.  Cont Flomax and Proscar.    Dione Booze Clair Alfieri, DO

## 2018-10-22 DIAGNOSIS — E0520 Thyrotoxicosis with toxic multinodular goiter without thyrotoxic crisis or storm: Secondary | ICD-10-CM | POA: Diagnosis not present

## 2018-10-29 DIAGNOSIS — E0520 Thyrotoxicosis with toxic multinodular goiter without thyrotoxic crisis or storm: Secondary | ICD-10-CM | POA: Diagnosis not present

## 2018-11-05 DIAGNOSIS — G4733 Obstructive sleep apnea (adult) (pediatric): Secondary | ICD-10-CM | POA: Diagnosis not present

## 2018-11-05 DIAGNOSIS — K219 Gastro-esophageal reflux disease without esophagitis: Secondary | ICD-10-CM | POA: Diagnosis not present

## 2018-11-05 DIAGNOSIS — J310 Chronic rhinitis: Secondary | ICD-10-CM | POA: Diagnosis not present

## 2018-12-05 NOTE — Telephone Encounter (Signed)
I recommend that he ask PCP to refer him to GI.  Referral needs to come from them.

## 2018-12-05 NOTE — Telephone Encounter (Signed)
Pt wife Joycelyn Schmid called and said that pt is having bad stomach pain pt is not having any urinary issues,she feels pt may have infection,I asked her had they reached out to PCP and she states that they have previously and they did nothing I did ask her did she feel it could be something that he would need to see Gastrologist for but she said she thought we could help. Please call [phone]. Thanks

## 2018-12-06 NOTE — Telephone Encounter (Signed)
Spoke with Joycelyn Schmid patients wife. She stated that he has an appt with Gastro on Monday.

## 2018-12-17 DIAGNOSIS — J3089 Other allergic rhinitis: Secondary | ICD-10-CM | POA: Diagnosis not present

## 2018-12-17 DIAGNOSIS — J019 Acute sinusitis, unspecified: Secondary | ICD-10-CM | POA: Diagnosis not present

## 2018-12-17 DIAGNOSIS — J4530 Mild persistent asthma, uncomplicated: Secondary | ICD-10-CM | POA: Diagnosis not present

## 2018-12-26 ENCOUNTER — Inpatient Hospital Stay: Admit: 2018-12-26 | Primary: Internal Medicine

## 2019-02-19 DIAGNOSIS — K5909 Other constipation: Secondary | ICD-10-CM | POA: Diagnosis not present

## 2019-02-19 DIAGNOSIS — Z86010 Personal history of colonic polyps: Secondary | ICD-10-CM | POA: Diagnosis not present

## 2019-02-19 DIAGNOSIS — K219 Gastro-esophageal reflux disease without esophagitis: Secondary | ICD-10-CM | POA: Diagnosis not present

## 2019-02-19 DIAGNOSIS — K2950 Unspecified chronic gastritis without bleeding: Secondary | ICD-10-CM | POA: Diagnosis not present

## 2019-02-19 DIAGNOSIS — K760 Fatty (change of) liver, not elsewhere classified: Secondary | ICD-10-CM | POA: Diagnosis not present

## 2019-02-19 DIAGNOSIS — R1013 Epigastric pain: Secondary | ICD-10-CM | POA: Diagnosis not present

## 2019-02-20 DIAGNOSIS — Z125 Encounter for screening for malignant neoplasm of prostate: Secondary | ICD-10-CM | POA: Diagnosis not present

## 2019-02-20 DIAGNOSIS — N401 Enlarged prostate with lower urinary tract symptoms: Secondary | ICD-10-CM | POA: Diagnosis not present

## 2019-02-20 DIAGNOSIS — Z79899 Other long term (current) drug therapy: Secondary | ICD-10-CM | POA: Diagnosis not present

## 2019-03-03 ENCOUNTER — Emergency Department: Admit: 2019-03-03 | Payer: MEDICARE | Primary: Internal Medicine

## 2019-03-03 ENCOUNTER — Inpatient Hospital Stay
Admit: 2019-03-03 | Discharge: 2019-03-03 | Disposition: A | Discharge status: Home / Self Care | Payer: MEDICARE | Attending: Emergency Medicine

## 2019-03-03 DIAGNOSIS — R1032 Left lower quadrant pain: Secondary | ICD-10-CM

## 2019-03-03 LAB — COMPREHENSIVE METABOLIC PANEL
ALT: 29 U/L (ref 12–65)
AST: 26 U/L (ref 15–37)
Albumin/Globulin Ratio: 1.2 (ref 1.2–3.5)
Albumin: 3.6 g/dL (ref 3.2–4.6)
Alkaline Phosphatase: 60 U/L (ref 50–136)
Anion Gap: 5 mmol/L — ABNORMAL LOW (ref 7–16)
BUN: 20 MG/DL (ref 8–23)
CO2: 28 mmol/L (ref 21–32)
Calcium: 8.9 MG/DL (ref 8.3–10.4)
Chloride: 108 mmol/L — ABNORMAL HIGH (ref 98–107)
Creatinine: 1.07 MG/DL (ref 0.8–1.5)
EGFR IF NonAfrican American: >60 mL/min/{1.73_m2} (ref >60)
GFR African American: >60 mL/min/{1.73_m2} (ref >60)
Globulin: 3.1 g/dL (ref 2.3–3.5)
Glucose: 96 mg/dL (ref 65–100)
Potassium: 3.8 mmol/L (ref 3.5–5.1)
Sodium: 141 mmol/L (ref 136–145)
Total Bilirubin: 0.8 MG/DL (ref 0.2–1.1)
Total Protein: 6.7 g/dL (ref 6.3–8.2)

## 2019-03-03 LAB — CBC WITH AUTO DIFFERENTIAL
Basophils %: 0 % (ref 0.0–2.0)
Basophils Absolute: 0 10*3/uL (ref 0.0–0.2)
Eosinophils %: 1 % (ref 0.5–7.8)
Eosinophils Absolute: 0.1 10*3/uL (ref 0.0–0.8)
Granulocyte Absolute Count: 0 10*3/uL (ref 0.0–0.5)
Hematocrit: 43.4 % (ref 41.1–50.3)
Hemoglobin: 14.2 g/dL (ref 13.6–17.2)
Immature Granulocytes: 0 % (ref 0.0–5.0)
Lymphocytes %: 39 % (ref 13–44)
Lymphocytes Absolute: 1.9 10*3/uL (ref 0.5–4.6)
MCH: 27.2 PG (ref 26.1–32.9)
MCHC: 32.7 g/dL (ref 31.4–35.0)
MCV: 83 FL (ref 79.6–97.8)
MPV: 9.7 FL (ref 9.4–12.3)
Monocytes %: 10 % (ref 4.0–12.0)
Monocytes Absolute: 0.5 10*3/uL (ref 0.1–1.3)
NRBC Absolute: 0 10*3/uL (ref 0.0–0.2)
Neutrophils %: 50 % (ref 43–78)
Neutrophils Absolute: 2.4 10*3/uL (ref 1.7–8.2)
Platelets: 152 10*3/uL (ref 150–450)
RBC: 5.23 M/uL (ref 4.23–5.6)
RDW: 13.8 % (ref 11.9–14.6)
WBC: 4.9 10*3/uL (ref 4.3–11.1)

## 2019-03-03 LAB — URINALYSIS W/ RFLX MICROSCOPIC
Bilirubin, Urine: NEGATIVE
Bilirubin: NEGATIVE
Blood, Urine: NEGATIVE
Blood: NEGATIVE
Glucose, Ur: NEGATIVE mg/dL
Glucose: NEGATIVE mg/dL
Ketone: NEGATIVE mg/dL
Ketones, Urine: NEGATIVE mg/dL
Leukocyte Esterase, Urine: NEGATIVE
Leukocyte Esterase: NEGATIVE
Nitrite, Urine: NEGATIVE
Nitrites: NEGATIVE
Protein, UA: NEGATIVE mg/dL
Protein: NEGATIVE mg/dL
Specific Gravity, UA: 1.018 (ref 1.001–1.023)
Specific gravity: 1.018 (ref 1.001–1.023)
Urobilinogen, UA, POCT: 1 EU/dL (ref 0.2–1.0)
Urobilinogen: 1 EU/dL (ref 0.2–1.0)
pH (UA): 8 (ref 5.0–9.0)
pH, UA: 8 (ref 5.0–9.0)

## 2019-03-03 LAB — EKG 12-LEAD
Atrial Rate: 46 {beats}/min
P Axis: 80 degrees
P-R Interval: 156 ms
Q-T Interval: 470 ms
QRS Duration: 88 ms
QTc Calculation (Bazett): 411 ms
R Axis: -22 degrees
T Axis: 63 degrees
Ventricular Rate: 46 {beats}/min

## 2019-03-03 LAB — LIPASE
Lipase: 199 U/L (ref 73–393)
Lipase: 199 U/L (ref 73–393)

## 2019-03-03 LAB — CBC WITH AUTOMATED DIFF
ABS. BASOPHILS: 0 10*3/uL (ref 0.0–0.2)
ABS. EOSINOPHILS: 0.1 10*3/uL (ref 0.0–0.8)
ABS. IMM. GRANS.: 0 10*3/uL (ref 0.0–0.5)
ABS. LYMPHOCYTES: 1.9 10*3/uL (ref 0.5–4.6)
ABS. MONOCYTES: 0.5 10*3/uL (ref 0.1–1.3)
ABS. NEUTROPHILS: 2.4 10*3/uL (ref 1.7–8.2)
ABSOLUTE NRBC: 0 10*3/uL (ref 0.0–0.2)
BASOPHILS: 0 % (ref 0.0–2.0)
EOSINOPHILS: 1 % (ref 0.5–7.8)
HCT: 43.4 % (ref 41.1–50.3)
HGB: 14.2 g/dL (ref 13.6–17.2)
IMMATURE GRANULOCYTES: 0 % (ref 0.0–5.0)
LYMPHOCYTES: 39 % (ref 13–44)
MCH: 27.2 PG (ref 26.1–32.9)
MCHC: 32.7 g/dL (ref 31.4–35.0)
MCV: 83 FL (ref 79.6–97.8)
MONOCYTES: 10 % (ref 4.0–12.0)
MPV: 9.7 FL (ref 9.4–12.3)
NEUTROPHILS: 50 % (ref 43–78)
PLATELET: 152 10*3/uL (ref 150–450)
RBC: 5.23 M/uL (ref 4.23–5.6)
RDW: 13.8 % (ref 11.9–14.6)
WBC: 4.9 10*3/uL (ref 4.3–11.1)

## 2019-03-03 LAB — METABOLIC PANEL, COMPREHENSIVE
A-G Ratio: 1.2 (ref 1.2–3.5)
ALT (SGPT): 29 U/L (ref 12–65)
AST (SGOT): 26 U/L (ref 15–37)
Albumin: 3.6 g/dL (ref 3.2–4.6)
Alk. phosphatase: 60 U/L (ref 50–136)
Anion gap: 5 mmol/L — ABNORMAL LOW (ref 7–16)
BUN: 20 MG/DL (ref 8–23)
Bilirubin, total: 0.8 MG/DL (ref 0.2–1.1)
CO2: 28 mmol/L (ref 21–32)
Calcium: 8.9 MG/DL (ref 8.3–10.4)
Chloride: 108 mmol/L — ABNORMAL HIGH (ref 98–107)
Creatinine: 1.07 MG/DL (ref 0.8–1.5)
GFR est AA: >60 mL/min/{1.73_m2} (ref >60)
GFR est non-AA: >60 mL/min/{1.73_m2} (ref >60)
Globulin: 3.1 g/dL (ref 2.3–3.5)
Glucose: 96 mg/dL (ref 65–100)
Potassium: 3.8 mmol/L (ref 3.5–5.1)
Protein, total: 6.7 g/dL (ref 6.3–8.2)
Sodium: 141 mmol/L (ref 136–145)

## 2019-03-03 LAB — EKG, 12 LEAD, INITIAL
Atrial Rate: 46 {beats}/min
Calculated P Axis: 80 degrees
Calculated R Axis: -22 degrees
Calculated T Axis: 63 degrees
P-R Interval: 156 ms
Q-T Interval: 470 ms
QRS Duration: 88 ms
QTC Calculation (Bezet): 411 ms
Ventricular Rate: 46 {beats}/min

## 2019-03-03 MED ORDER — IOPAMIDOL 76 % IV SOLN
76 % | Freq: Once | INTRAVENOUS | Status: AC
Start: 2019-03-03 — End: 2019-03-03
  Administered 2019-03-03: 19:00:00 via INTRAVENOUS

## 2019-03-03 MED ORDER — DIATRIZOATE MEGLUMINE & SODIUM 66 %-10 % ORAL SOLN
66-10 % | Freq: Once | ORAL | Status: AC
Start: 2019-03-03 — End: 2019-03-03
  Administered 2019-03-03: 17:00:00 via ORAL

## 2019-03-03 MED ORDER — SODIUM CHLORIDE 0.9% BOLUS IV
0.9 % | Freq: Once | INTRAVENOUS | Status: AC
Start: 2019-03-03 — End: 2019-03-03
  Administered 2019-03-03: 19:00:00 via INTRAVENOUS

## 2019-03-03 MED ORDER — SALINE PERIPHERAL FLUSH PRN
Freq: Once | INTRAMUSCULAR | Status: AC
Start: 2019-03-03 — End: 2019-03-03
  Administered 2019-03-03: 19:00:00

## 2019-03-03 MED FILL — MD-GASTROVIEW 66 %-10 % ORAL SOLUTION: 66-10 % | ORAL | Qty: 30

## 2019-03-03 NOTE — ED Notes (Signed)
Pt adds that he is light headed and dizzy since waking up this morning. Also states he has been taking Augmentin that he finished yesterday for diverticulitis due to pain.

## 2019-03-03 NOTE — ED Notes (Signed)
I have reviewed discharge instructions with the patient and spouse.  The patient and spouse verbalized understanding.    Patient left ED via Discharge Method: ambulatory to Home with family.    Opportunity for questions and clarification provided.       Patient given 0 scripts.         To continue your aftercare when you leave the hospital, you may receive an automated call from our care team to check in on how you are doing.  This is a free service and part of our promise to provide the best care and service to meet your aftercare needs." If you have questions, or wish to unsubscribe from this service please call [phone].  Thank you for Choosing our [facility].

## 2019-03-03 NOTE — ED Provider Notes (Signed)
73 year old male with a history of high cholesterol, hypertension, prostate cancer presents with left lower abdominal pain intermittently for the past 2 weeks.  Pain occasionally radiates into the groin, but denies testicle pain or swelling.  When asked how to describe the pain, he states "it is like an anxiety feeling."  Pain has worsened today.  He was constipated 3 weeks ago and started twice daily MiraLAX.  He has had regular bowel movements since that time, but is still taking MiraLAX.  Denies any blood in stools or diarrhea.  No nausea, vomiting, fever, cough, congestion, shortness of breath breath, travel, known exposure coronavirus.  Denies pain with urination or difficulty urinating.  No blood in urine.      Abdominal Pain    Associated symptoms include constipation. Pertinent negatives include no fever, no diarrhea, no nausea, no vomiting, no dysuria, no hematuria, no headaches, no chest pain, no testicular pain and no back pain.        Past Medical History:   Diagnosis Date   ??? Hypercholesterolemia    ??? Hypertension    ??? Personal history of prostate cancer        Past Surgical History:   Procedure Laterality Date   ??? HX OTHER SURGICAL      brachytherapy 3/04         History reviewed. No pertinent family history.    Social History     Socioeconomic History   ??? Marital status: MARRIED     Spouse name: Not on file   ??? Number of children: Not on file   ??? Years of education: Not on file   ??? Highest education level: Not on file   Occupational History   ??? Not on file   Social Needs   ??? Financial resource strain: Not on file   ??? Food insecurity     Worry: Not on file     Inability: Not on file   ??? Transportation needs     Medical: Not on file     Non-medical: Not on file   Tobacco Use   ??? Smoking status: Never Smoker   Substance and Sexual Activity   ??? Alcohol use: No   ??? Drug use: Not on file   ??? Sexual activity: Not on file   Lifestyle   ??? Physical activity     Days per week: Not on file     Minutes per  session: Not on file   ??? Stress: Not on file   Relationships   ??? Social Product manager on phone: Not on file     Gets together: Not on file     Attends religious service: Not on file     Active member of club or organization: Not on file     Attends meetings of clubs or organizations: Not on file     Relationship status: Not on file   ??? Intimate partner violence     Fear of current or ex partner: Not on file     Emotionally abused: Not on file     Physically abused: Not on file     Forced sexual activity: Not on file   Other Topics Concern   ??? Not on file   Social History Narrative   ??? Not on file         ALLERGIES: Lisinopril; Meloxicam; Oxaprozin; and Sulfa (sulfonamide antibiotics)    Review of Systems   Constitutional: Negative for chills and fever.  HENT: Negative for hearing loss.    Eyes: Negative for visual disturbance.   Respiratory: Negative for cough and shortness of breath.    Cardiovascular: Negative for chest pain and palpitations.   Gastrointestinal: Positive for abdominal pain and constipation. Negative for blood in stool, diarrhea, nausea and vomiting.   Genitourinary: Negative for difficulty urinating, dysuria, hematuria, scrotal swelling and testicular pain.   Musculoskeletal: Negative for back pain.   Skin: Negative for rash.   Neurological: Negative for weakness and headaches.   Psychiatric/Behavioral: Negative for confusion.       Vitals:    03/03/19 1233   BP: 158/75   Pulse: (!) 47   Resp: 16   Temp: 97.7 ??F (36.5 ??C)   SpO2: 98%   Weight: 77.1 kg (170 lb)   Height: 5' 10" (1.778 m)            Physical Exam  Vitals signs and nursing note reviewed.   Constitutional:       Appearance: He is well-developed.   HENT:      Head: Normocephalic and atraumatic.   Eyes:      Pupils: Pupils are equal, round, and reactive to light.   Neck:      Musculoskeletal: Normal range of motion and neck supple.   Cardiovascular:      Rate and Rhythm: Regular rhythm. Bradycardia present.      Heart sounds:  Normal heart sounds.   Pulmonary:      Effort: Pulmonary effort is normal.      Breath sounds: Normal breath sounds.   Abdominal:      Palpations: Abdomen is soft.      Tenderness: There is no abdominal tenderness.   Musculoskeletal: Normal range of motion.   Skin:     General: Skin is warm and dry.   Neurological:      Mental Status: He is alert.   Psychiatric:         Behavior: Behavior normal.          MDM  Number of Diagnoses or Management Options  Diagnosis management comments: Parts of this document were created using dragon voice recognition software. The chart has been reviewed but errors may still be present.    I wore appropriate PPE throughout this patient's ED visit. Waylan Rocher, MD, 1:00 PM    No current reproducible abdominal tenderness.  CT ordered.  Does not want anything for pain at this time.    3:09 PM  CT and labs unremarkable.  He has actually had problems with this for quite some time.  He had a normal colonoscopy in April.  He saw his primary care physician a few weeks ago and just finished a course of antibiotics for suspected diverticulitis.  Advised GI and primary care follow-up.     I discussed the results of all labs, procedures, radiographs, and treatments with the patient and available family.  Treatment plan is agreed upon and the patient is ready for discharge.  Questions about treatment in the ED and differential diagnosis of presenting condition were answered.  Patient was given verbal discharge instructions including, but not limited to, importance of returning to the emergency department for any concern of worsening or continued symptoms.  Instructions were given to follow up with a primary care provider or specialist within 1-2 days.  Adverse effects of medications, if prescribed, were discussed and patient was advised to refrain from significant physical activity until followed up by primary care physician and to  not drive or operate heavy machinery after taking any sedating  substances.           Amount and/or Complexity of Data Reviewed  Clinical lab tests: reviewed and ordered (Results for orders placed or performed during the hospital encounter of 03/03/19  -CBC WITH AUTOMATED DIFF       Result                      Value             Ref Range           WBC                         4.9               4.3 - 11.1 K*       RBC                         5.23              4.23 - 5.6 M*       HGB                         14.2              13.6 - 17.2 *       HCT                         43.4              41.1 - 50.3 %       MCV                         83.0              79.6 - 97.8 *       MCH                         27.2              26.1 - 32.9 *       MCHC                        32.7              31.4 - 35.0 *       RDW                         13.8              11.9 - 14.6 %       PLATELET                    152               150 - 450 K/*       MPV                         9.7               9.4 - 12.3 FL       ABSOLUTE NRBC  0.00              0.0 - 0.2 K/*       DF                          AUTOMATED                             NEUTROPHILS                 50                43 - 78 %           LYMPHOCYTES                 39                13 - 44 %           MONOCYTES                   10                4.0 - 12.0 %        EOSINOPHILS                 1                 0.5 - 7.8 %         BASOPHILS                   0                 0.0 - 2.0 %         IMMATURE GRANULOCYTES       0                 0.0 - 5.0 %         ABS. NEUTROPHILS            2.4               1.7 - 8.2 K/*       ABS. LYMPHOCYTES            1.9               0.5 - 4.6 K/*       ABS. MONOCYTES              0.5               0.1 - 1.3 K/*       ABS. EOSINOPHILS            0.1               0.0 - 0.8 K/*       ABS. BASOPHILS              0.0               0.0 - 0.2 K/*       ABS. IMM. GRANS.            0.0               0.0 - 0.5 K/*  -METABOLIC PANEL, COMPREHENSIVE       Result  Value              Ref Range           Sodium                      141               136 - 145 mm*       Potassium                   3.8               3.5 - 5.1 mm*       Chloride                    108 (H)           98 - 107 mmo*       CO2                         28                21 - 32 mmol*       Anion gap                   5 (L)             7 - 16 mmol/L       Glucose                     96                65 - 100 mg/*       BUN                         20                8 - 23 MG/DL        Creatinine                  1.07              0.8 - 1.5 MG*       GFR est AA                  >60               >60 ml/min/1*       GFR est non-AA              >60               >60 ml/min/1*       Calcium                     8.9               8.3 - 10.4 M*       Bilirubin, total            0.8               0.2 - 1.1 MG*       ALT (SGPT)                  29                12 - 65 U/L  AST (SGOT)                  26                15 - 37 U/L         Alk. phosphatase            60                50 - 136 U/L        Protein, total              6.7               6.3 - 8.2 g/*       Albumin                     3.6               3.2 - 4.6 g/*       Globulin                    3.1               2.3 - 3.5 g/*       A-G Ratio                   1.2               1.2 - 3.5      -URINALYSIS W/ RFLX MICROSCOPIC       Result                      Value             Ref Range           Color                       YELLOW                                Appearance                  TURBID                                Specific gravity            1.018             1.001 - 1.02*       pH (UA)                     8.0               5.0 - 9.0           Protein                     Negative          NEG mg/dL           Glucose                     Negative          mg/dL               Ketone  Negative          NEG mg/dL           Bilirubin                   Negative          NEG                 Blood                       Negative           NEG                 Urobilinogen                1.0               0.2 - 1.0 EU*       Nitrites                    Negative          NEG                 Leukocyte Esterase          Negative          NEG            -LIPASE       Result                      Value             Ref Range           Lipase                      199               73 - 393 U/L   -EKG, 12 LEAD, INITIAL       Result                      Value             Ref Range           Ventricular Rate            46                BPM                 Atrial Rate                 46                BPM                 P-R Interval                156               ms                  QRS Duration                88                ms                  Q-T Interval  470               ms                  QTC Calculation (Bezet)     411               ms                  Calculated P Axis           80                degrees             Calculated R Axis           -22               degrees             Calculated T Axis           63                degrees             Diagnosis                                                     Marked sinus bradycardia   Abnormal ECG   When compared with ECG of 12-Dec-2015 13:14,   No significant change was found     )  Tests in the radiology section of CPT??: ordered and reviewed (Ct Abd Pelv W Cont    Result Date: 03/03/2019  HISTORY:  abdominal pain, LLQ x 2 weeks COMPARISON: None EXAM: CT abdomen and pelvis with iv contrast TECHNIQUE: Thin section axial CT was performed from the lung bases through the symphysis pubis during uneventful rapid bolus intravenous administration of 125 mL of Isovue 370. Oral contrast was also administered. Radiation dose reduction techniques were used for this study.  Our CT scanners use one or all of the following: Automated exposure control, adjustment of the mA and/or kV according to patient size, use of iterative reconstruction. FINDINGS: The lung bases are clear. CT abdomen: The liver and  spleen enhances homogeneously without discrete lesions. There is no biliary ductal dilatation. The gallbladder, pancreas and adrenal glands are normal. The kidneys enhance symmetrically. Bowel loops in the upper abdomen are normal. No definite upper abdominal lymphadenopathy seen. CT pelvis: The appendix is normal. The bladder and rectum are normal. No pelvic adenopathy seen. No free air or free fluid seen within the pelvis. Bone window evaluation demonstrates no aggressive osseous lesions.     IMPRESSION: No acute abnormality.     )           Procedures

## 2019-03-03 NOTE — ED Notes (Signed)
Pt states lower abd pain in mid abd and on left side. Pt denies N/V/D. Normal BM yesterday. Denies urinary symptoms. Pt given a mask in triage.

## 2019-03-03 NOTE — ED Triage Notes (Signed)
Pt adds that he is light headed and dizzy since waking up this morning. Also states he has been taking Augmentin that he finished yesterday for diverticulitis due to pain.

## 2019-03-03 NOTE — ED Triage Notes (Signed)
Pt states lower abd pain in mid abd and on left side. Pt denies N/V/D. Normal BM yesterday. Denies urinary symptoms. Pt given a mask in triage.

## 2019-03-03 NOTE — ED Provider Notes (Signed)
73 year old male with a history of high cholesterol, hypertension, prostate cancer presents with left lower abdominal pain intermittently for the past 2 weeks.  Pain occasionally radiates into the groin, but denies testicle pain or swelling.  When asked how to describe the pain, he states "it is like an anxiety feeling."  Pain has worsened today.  He was constipated 3 weeks ago and started twice daily MiraLAX.  He has had regular bowel movements since that time, but is still taking MiraLAX.  Denies any blood in stools or diarrhea.  No nausea, vomiting, fever, cough, congestion, shortness of breath breath, travel, known exposure coronavirus.  Denies pain with urination or difficulty urinating.  No blood in urine.      Abdominal Pain    Associated symptoms include constipation. Pertinent negatives include no fever, no diarrhea, no nausea, no vomiting, no dysuria, no hematuria, no headaches, no chest pain, no testicular pain and no back pain.        Past Medical History:   Diagnosis Date   ??? Hypercholesterolemia    ??? Hypertension    ??? Personal history of prostate cancer        Past Surgical History:   Procedure Laterality Date   ??? HX OTHER SURGICAL      brachytherapy 3/04         History reviewed. No pertinent family history.    Social History     Socioeconomic History   ??? Marital status: MARRIED     Spouse name: Not on file   ??? Number of children: Not on file   ??? Years of education: Not on file   ??? Highest education level: Not on file   Occupational History   ??? Not on file   Social Needs   ??? Financial resource strain: Not on file   ??? Food insecurity     Worry: Not on file     Inability: Not on file   ??? Transportation needs     Medical: Not on file     Non-medical: Not on file   Tobacco Use   ??? Smoking status: Never Smoker   Substance and Sexual Activity   ??? Alcohol use: No   ??? Drug use: Not on file   ??? Sexual activity: Not on file   Lifestyle   ??? Physical activity     Days per week: Not on file      Minutes per session: Not on file   ??? Stress: Not on file   Relationships   ??? Social Product manager on phone: Not on file     Gets together: Not on file     Attends religious service: Not on file     Active member of club or organization: Not on file     Attends meetings of clubs or organizations: Not on file     Relationship status: Not on file   ??? Intimate partner violence     Fear of current or ex partner: Not on file     Emotionally abused: Not on file     Physically abused: Not on file     Forced sexual activity: Not on file   Other Topics Concern   ??? Not on file   Social History Narrative   ??? Not on file         ALLERGIES: Lisinopril; Meloxicam; Oxaprozin; and Sulfa (sulfonamide antibiotics)    Review of Systems   Constitutional: Negative for chills and fever.  HENT: Negative for hearing loss.    Eyes: Negative for visual disturbance.   Respiratory: Negative for cough and shortness of breath.    Cardiovascular: Negative for chest pain and palpitations.   Gastrointestinal: Positive for abdominal pain and constipation. Negative for blood in stool, diarrhea, nausea and vomiting.   Genitourinary: Negative for difficulty urinating, dysuria, hematuria, scrotal swelling and testicular pain.   Musculoskeletal: Negative for back pain.   Skin: Negative for rash.   Neurological: Negative for weakness and headaches.   Psychiatric/Behavioral: Negative for confusion.       Vitals:    03/03/19 1233   BP: 158/75   Pulse: (!) 47   Resp: 16   Temp: 97.7 ??F (36.5 ??C)   SpO2: 98%   Weight: 77.1 kg (170 lb)   Height: '5\' 10"'$  (1.778 m)            Physical Exam  Vitals signs and nursing note reviewed.   Constitutional:       Appearance: He is well-developed.   HENT:      Head: Normocephalic and atraumatic.   Eyes:      Pupils: Pupils are equal, round, and reactive to light.   Neck:      Musculoskeletal: Normal range of motion and neck supple.   Cardiovascular:      Rate and Rhythm: Regular rhythm. Bradycardia present.       Heart sounds: Normal heart sounds.   Pulmonary:      Effort: Pulmonary effort is normal.      Breath sounds: Normal breath sounds.   Abdominal:      Palpations: Abdomen is soft.      Tenderness: There is no abdominal tenderness.   Musculoskeletal: Normal range of motion.   Skin:     General: Skin is warm and dry.   Neurological:      Mental Status: He is alert.   Psychiatric:         Behavior: Behavior normal.          MDM  Number of Diagnoses or Management Options  Diagnosis management comments: Parts of this document were created using dragon voice recognition software. The chart has been reviewed but errors may still be present.    I wore appropriate PPE throughout this patient's ED visit. Waylan Rocher, MD, 1:00 PM    No current reproducible abdominal tenderness.  CT ordered.  Does not want anything for pain at this time.    3:09 PM  CT and labs unremarkable.  He has actually had problems with this for quite some time.  He had a normal colonoscopy in April.  He saw his primary care physician a few weeks ago and just finished a course of antibiotics for suspected diverticulitis.  Advised GI and primary care follow-up.     I discussed the results of all labs, procedures, radiographs, and treatments with the patient and available family.  Treatment plan is agreed upon and the patient is ready for discharge.  Questions about treatment in the ED and differential diagnosis of presenting condition were answered.  Patient was given verbal discharge instructions including, but not limited to, importance of returning to the emergency department for any concern of worsening or continued symptoms.  Instructions were given to follow up with a primary care provider or specialist within 1-2 days.  Adverse effects of medications, if prescribed, were discussed and patient was advised to refrain from significant physical activity until followed up by primary care physician and to  not drive or operate heavy  machinery after taking any sedating substances.           Amount and/or Complexity of Data Reviewed  Clinical lab tests: reviewed and ordered (Results for orders placed or performed during the hospital encounter of 03/03/19  -CBC WITH AUTOMATED DIFF       Result                      Value             Ref Range           WBC                         4.9               4.3 - 11.1 K*       RBC                         5.23              4.23 - 5.6 M*       HGB                         14.2              13.6 - 17.2 *       HCT                         43.4              41.1 - 50.3 %       MCV                         83.0              79.6 - 97.8 *       MCH                         27.2              26.1 - 32.9 *       MCHC                        32.7              31.4 - 35.0 *       RDW                         13.8              11.9 - 14.6 %       PLATELET                    152               150 - 450 K/*       MPV                         9.7               9.4 - 12.3 FL       ABSOLUTE NRBC  0.00              0.0 - 0.2 K/*       DF                          AUTOMATED                             NEUTROPHILS                 50                43 - 78 %           LYMPHOCYTES                 39                13 - 44 %           MONOCYTES                   10                4.0 - 12.0 %        EOSINOPHILS                 1                 0.5 - 7.8 %         BASOPHILS                   0                 0.0 - 2.0 %         IMMATURE GRANULOCYTES       0                 0.0 - 5.0 %         ABS. NEUTROPHILS            2.4               1.7 - 8.2 K/*       ABS. LYMPHOCYTES            1.9               0.5 - 4.6 K/*       ABS. MONOCYTES              0.5               0.1 - 1.3 K/*       ABS. EOSINOPHILS            0.1               0.0 - 0.8 K/*       ABS. BASOPHILS              0.0               0.0 - 0.2 K/*       ABS. IMM. GRANS.            0.0               0.0 - 0.5 K/*  -METABOLIC PANEL, COMPREHENSIVE        Result  Value             Ref Range           Sodium                      141               136 - 145 mm*       Potassium                   3.8               3.5 - 5.1 mm*       Chloride                    108 (H)           98 - 107 mmo*       CO2                         28                21 - 32 mmol*       Anion gap                   5 (L)             7 - 16 mmol/L       Glucose                     96                65 - 100 mg/*       BUN                         20                8 - 23 MG/DL        Creatinine                  1.07              0.8 - 1.5 MG*       GFR est AA                  >60               >60 ml/min/1*       GFR est non-AA              >60               >60 ml/min/1*       Calcium                     8.9               8.3 - 10.4 M*       Bilirubin, total            0.8               0.2 - 1.1 MG*       ALT (SGPT)                  29                12 - 65 U/L  AST (SGOT)                  26                15 - 37 U/L         Alk. phosphatase            60                50 - 136 U/L        Protein, total              6.7               6.3 - 8.2 g/*       Albumin                     3.6               3.2 - 4.6 g/*       Globulin                    3.1               2.3 - 3.5 g/*       A-G Ratio                   1.2               1.2 - 3.5      -URINALYSIS W/ RFLX MICROSCOPIC       Result                      Value             Ref Range           Color                       YELLOW                                Appearance                  TURBID                                Specific gravity            1.018             1.001 - 1.02*       pH (UA)                     8.0               5.0 - 9.0           Protein                     Negative          NEG mg/dL           Glucose                     Negative          mg/dL               Ketone  Negative          NEG mg/dL           Bilirubin                   Negative          NEG                  Blood                       Negative          NEG                 Urobilinogen                1.0               0.2 - 1.0 EU*       Nitrites                    Negative          NEG                 Leukocyte Esterase          Negative          NEG            -LIPASE       Result                      Value             Ref Range           Lipase                      199               73 - 393 U/L   -EKG, 12 LEAD, INITIAL       Result                      Value             Ref Range           Ventricular Rate            46                BPM                 Atrial Rate                 46                BPM                 P-R Interval                156               ms                  QRS Duration                88                ms                  Q-T Interval  470               ms                  QTC Calculation (Bezet)     411               ms                  Calculated P Axis           80                degrees             Calculated R Axis           -22               degrees             Calculated T Axis           63                degrees             Diagnosis                                                     Marked sinus bradycardia   Abnormal ECG   When compared with ECG of 12-Dec-2015 13:14,   No significant change was found     )  Tests in the radiology section of CPT??: ordered and reviewed (Ct Abd Pelv W Cont    Result Date: 03/03/2019  HISTORY:  abdominal pain, LLQ x 2 weeks COMPARISON: None EXAM: CT abdomen and pelvis with iv contrast TECHNIQUE: Thin section axial CT was performed from the lung bases through the symphysis pubis during uneventful rapid bolus intravenous administration of 125 mL of Isovue 370. Oral contrast was also administered. Radiation dose reduction techniques were used for this study.  Our CT scanners use one or all of the following: Automated exposure control, adjustment of the mA and/or kV according to patient  size, use of iterative reconstruction. FINDINGS: The lung bases are clear. CT abdomen: The liver and spleen enhances homogeneously without discrete lesions. There is no biliary ductal dilatation. The gallbladder, pancreas and adrenal glands are normal. The kidneys enhance symmetrically. Bowel loops in the upper abdomen are normal. No definite upper abdominal lymphadenopathy seen. CT pelvis: The appendix is normal. The bladder and rectum are normal. No pelvic adenopathy seen. No free air or free fluid seen within the pelvis. Bone window evaluation demonstrates no aggressive osseous lesions.     IMPRESSION: No acute abnormality.     )           Procedures

## 2019-03-03 NOTE — ED Notes (Signed)
I have reviewed discharge instructions with the patient and spouse.  The patient and spouse verbalized understanding.    Patient left ED via Discharge Method: ambulatory to Home with family  Opportunity for questions and clarification provided.       Patient given 0 scripts.         To continue your aftercare when you leave the hospital, you may receive an automated call from our care team to check in on how you are doing.  This is a free service and part of our promise to provide the best care and service to meet your aftercare needs.??? If you have questions, or wish to unsubscribe from this service please call [phone].  Thank you for Choosing our [facility].

## 2019-03-05 NOTE — Progress Notes (Signed)
 This note will not be viewable in MyChart.      Patient contacted regarding recent visit for viral symptoms.  This denunzio contacted the patient by telephone to perform post discharge call. Verified name and DOB with patient as identifiers. Provided introduction to self, and reason for call due to viral symptoms of infection and/or exposure to COVID-19.   Discussed COVID-19 related testing which was not done at this time. Test results were not done. Patient informed of results, if available? no      Patient presented to emergency department/flu clinic with complaints of viral symptoms/exposure to COVID. Patient reports symptoms are improving. Due to no new or worsening symptoms the RN CTN/ACM was not notified for escalation.   Discussed exposure protocols and quarantine with CDC Guidelines What To Do If You Are Sick    Patient was given an opportunity for questions and concerns.     Stay home except to get medical care  Separate yourself from other people and animals in your home  Call ahead before visiting your doctor  Wear a facemask  Cover your coughs and sneezes  Clean your hands often  Avoid sharing personal household items  Clean all "high-touch" surfaces everyday    Monitor your symptoms  Seek prompt medical attention if your illness is worsening (e.g., difficulty breathing). Before seeking care, call your healthcare provider and tell them that you have, or are being evaluated for, COVID-19. Put on a facemask before you enter the facility. These steps will help the healthcare provider's office to keep other people in the office or waiting room from getting infected or exposed. Ask your healthcare provider to call the local or state health department. Persons who are placed under If you have a medical emergency and need to call 911, notify the dispatch personnel that you have, or are being evaluated for COVID-19. If possible, put on a facemask before emergency medical services arrive.    The patient agrees to  contact the [facility] exposure line [phone], local health department [place]  Department of Health: [phone]) and PCP office for questions related to their healthcare. Chartered loss adjuster provided contact information for future reference.    Patient/family/caregiver given information for [employer] and agrees to enroll no  Patient preferred e-mail: NO EMAIL  Patient preferred phone contact:   Based on Loop alert triggers, patient will be contacted by nurse care manager for worsening symptoms.

## 2019-03-05 NOTE — Progress Notes (Signed)
This note will not be viewable in [place].      Patient contacted regarding recent visit for viral symptoms.  This Pryor Curia contacted the patient by telephone to perform post discharge call. Verified name and DOB with patient as identifiers. Provided introduction to self, and reason for call due to viral symptoms of infection and/or exposure to COVID-19.   Discussed COVID-19 related testing which was not done at this time. Test results were not done. Patient informed of results, if available? no      Patient presented to emergency department/flu clinic with complaints of viral symptoms/exposure to COVID. Patient reports symptoms are improving. Due to no new or worsening symptoms the RN CTN/ACM was not notified for escalation.   Discussed exposure protocols and quarantine with CDC Guidelines What To Do If You Are Sick    Patient was given an opportunity for questions and concerns.     Stay home except to get medical care  Separate yourself from other people and animals in your home  Call ahead before visiting your doctor  Wear a facemask  Cover your coughs and sneezes  Clean your hands often  Avoid sharing personal household items  Clean all ???high-touch??? surfaces everyday    Monitor your symptoms  Seek prompt medical attention if your illness is worsening (e.g., difficulty breathing). Before seeking care, call your healthcare provider and tell them that you have, or are being evaluated for, COVID-19. Put on a facemask before you enter the facility. These steps will help the healthcare provider's office to keep other people in the office or waiting room from getting infected or exposed. Ask your healthcare provider to call the local or state health department. Persons who are placed under If you have a medical emergency and need to call 911, notify the dispatch personnel that you have, or are being evaluated for COVID-19. If possible, put on a facemask before emergency medical services arrive.     The patient agrees to contact the [facility] exposure line [phone], local health department [place] Department of Health: [phone]) and PCP office for questions related to their healthcare. Chief Strategy Officer provided contact information for future reference.    Patient/family/caregiver given information for [employer] and agrees to enroll no  Patient preferred e-mail: NO EMAIL  Patient preferred phone contact:   Based on Loop alert triggers, patient will be contacted by nurse care manager for worsening symptoms.

## 2019-03-27 ENCOUNTER — Other Ambulatory Visit: Payer: Self-pay

## 2019-03-27 ENCOUNTER — Other Ambulatory Visit
Admission: RE | Admit: 2019-03-27 | Discharge: 2019-03-27 | Disposition: A | Discharge status: Home / Self Care | Payer: PPO | Source: Ambulatory Visit | Attending: Unknown Physician Specialty | Admitting: Unknown Physician Specialty

## 2019-03-27 DIAGNOSIS — Z1159 Encounter for screening for other viral diseases: Secondary | ICD-10-CM | POA: Insufficient documentation

## 2019-03-28 ENCOUNTER — Encounter: Payer: Self-pay | Admitting: *Deleted

## 2019-03-28 LAB — SARS CORONAVIRUS 2 (TAT 6-24 HRS): SARS Coronavirus 2: NEGATIVE

## 2019-03-29 NOTE — Progress Notes (Signed)
This note will not be viewable in [facility].      2nd attempt to contact patient for Transitions of Care; Concern for Covid-19 (Coronavirus) call, no answer, left message on voicemail to please return my call.   Episode will be closed at this time due to inability to reach patient. Episode will be reopened if receive return call.

## 2019-03-29 NOTE — Progress Notes (Signed)
This note will not be viewable in [place].      2nd attempt to contact patient for Transitions of Care; Concern for Covid-19 (Coronavirus) call, no answer, left message on voicemail to please return my call.   Episode will be closed at this time due to inability to reach patient. Episode will be reopened if receive return call.

## 2019-03-31 ENCOUNTER — Encounter: Payer: Self-pay | Admitting: *Deleted

## 2019-03-31 ENCOUNTER — Ambulatory Visit: Payer: PPO | Admitting: Certified Registered Nurse Anesthetist

## 2019-03-31 ENCOUNTER — Ambulatory Visit
Admission: RE | Admit: 2019-03-31 | Discharge: 2019-03-31 | Disposition: A | Discharge status: Home / Self Care | Payer: PPO | Attending: Internal Medicine | Admitting: Internal Medicine

## 2019-03-31 ENCOUNTER — Encounter
Admission: RE | Disposition: A | Discharge status: Home / Self Care | Payer: Self-pay | Source: Home / Self Care | Attending: Internal Medicine

## 2019-03-31 DIAGNOSIS — K5730 Diverticulosis of large intestine without perforation or abscess without bleeding: Secondary | ICD-10-CM | POA: Diagnosis not present

## 2019-03-31 DIAGNOSIS — K2950 Unspecified chronic gastritis without bleeding: Secondary | ICD-10-CM | POA: Diagnosis not present

## 2019-03-31 DIAGNOSIS — I10 Essential (primary) hypertension: Secondary | ICD-10-CM | POA: Diagnosis not present

## 2019-03-31 DIAGNOSIS — Z7951 Long term (current) use of inhaled steroids: Secondary | ICD-10-CM | POA: Diagnosis not present

## 2019-03-31 DIAGNOSIS — G4730 Sleep apnea, unspecified: Secondary | ICD-10-CM | POA: Diagnosis not present

## 2019-03-31 DIAGNOSIS — N400 Enlarged prostate without lower urinary tract symptoms: Secondary | ICD-10-CM | POA: Insufficient documentation

## 2019-03-31 DIAGNOSIS — Z791 Long term (current) use of non-steroidal anti-inflammatories (NSAID): Secondary | ICD-10-CM | POA: Diagnosis not present

## 2019-03-31 DIAGNOSIS — K3189 Other diseases of stomach and duodenum: Secondary | ICD-10-CM | POA: Insufficient documentation

## 2019-03-31 DIAGNOSIS — K210 Gastro-esophageal reflux disease with esophagitis: Secondary | ICD-10-CM | POA: Diagnosis not present

## 2019-03-31 DIAGNOSIS — Z7982 Long term (current) use of aspirin: Secondary | ICD-10-CM | POA: Diagnosis not present

## 2019-03-31 DIAGNOSIS — R1013 Epigastric pain: Secondary | ICD-10-CM | POA: Insufficient documentation

## 2019-03-31 DIAGNOSIS — Z980 Intestinal bypass and anastomosis status: Secondary | ICD-10-CM | POA: Diagnosis not present

## 2019-03-31 DIAGNOSIS — K2960 Other gastritis without bleeding: Secondary | ICD-10-CM | POA: Diagnosis not present

## 2019-03-31 DIAGNOSIS — Z79899 Other long term (current) drug therapy: Secondary | ICD-10-CM | POA: Insufficient documentation

## 2019-03-31 DIAGNOSIS — E0590 Thyrotoxicosis, unspecified without thyrotoxic crisis or storm: Secondary | ICD-10-CM | POA: Insufficient documentation

## 2019-03-31 DIAGNOSIS — K635 Polyp of colon: Secondary | ICD-10-CM | POA: Diagnosis not present

## 2019-03-31 DIAGNOSIS — K641 Second degree hemorrhoids: Secondary | ICD-10-CM | POA: Diagnosis not present

## 2019-03-31 DIAGNOSIS — D123 Benign neoplasm of transverse colon: Secondary | ICD-10-CM | POA: Insufficient documentation

## 2019-03-31 DIAGNOSIS — Z79890 Hormone replacement therapy (postmenopausal): Secondary | ICD-10-CM | POA: Diagnosis not present

## 2019-03-31 DIAGNOSIS — K2970 Gastritis, unspecified, without bleeding: Secondary | ICD-10-CM | POA: Diagnosis not present

## 2019-03-31 DIAGNOSIS — K219 Gastro-esophageal reflux disease without esophagitis: Secondary | ICD-10-CM | POA: Diagnosis not present

## 2019-03-31 DIAGNOSIS — K5790 Diverticulosis of intestine, part unspecified, without perforation or abscess without bleeding: Secondary | ICD-10-CM | POA: Diagnosis not present

## 2019-03-31 DIAGNOSIS — K5900 Constipation, unspecified: Secondary | ICD-10-CM | POA: Diagnosis not present

## 2019-03-31 DIAGNOSIS — Z9049 Acquired absence of other specified parts of digestive tract: Secondary | ICD-10-CM | POA: Insufficient documentation

## 2019-03-31 DIAGNOSIS — Z86010 Personal history of colonic polyps: Secondary | ICD-10-CM | POA: Diagnosis not present

## 2019-03-31 DIAGNOSIS — D126 Benign neoplasm of colon, unspecified: Secondary | ICD-10-CM | POA: Diagnosis not present

## 2019-03-31 DIAGNOSIS — K648 Other hemorrhoids: Secondary | ICD-10-CM | POA: Diagnosis not present

## 2019-03-31 DIAGNOSIS — R140 Abdominal distension (gaseous): Secondary | ICD-10-CM | POA: Diagnosis not present

## 2019-03-31 HISTORY — DX: Benign prostatic hyperplasia without lower urinary tract symptoms: N40.0

## 2019-03-31 HISTORY — PX: COLONOSCOPY WITH PROPOFOL: SHX5780

## 2019-03-31 HISTORY — DX: Diverticulosis of intestine, part unspecified, without perforation or abscess without bleeding: K57.90

## 2019-03-31 HISTORY — DX: Thrombocytopenia, unspecified: D69.6

## 2019-03-31 HISTORY — DX: Gastro-esophageal reflux disease without esophagitis: K21.9

## 2019-03-31 HISTORY — DX: Diverticulitis of intestine, part unspecified, without perforation or abscess without bleeding: K57.92

## 2019-03-31 HISTORY — PX: ESOPHAGOGASTRODUODENOSCOPY (EGD) WITH PROPOFOL: SHX5813

## 2019-03-31 SURGERY — ESOPHAGOGASTRODUODENOSCOPY (EGD) WITH PROPOFOL
Anesthesia: General

## 2019-03-31 MED ORDER — EPHEDRINE SULFATE 50 MG/ML IJ SOLN
INTRAMUSCULAR | Status: DC | PRN
  Administered 2019-03-31 (×4): 5 mg via INTRAVENOUS

## 2019-03-31 MED ORDER — PROPOFOL 10 MG/ML IV BOLUS
INTRAVENOUS | Status: DC | PRN
  Administered 2019-03-31: 40 mg via INTRAVENOUS
  Administered 2019-03-31: 70 mg via INTRAVENOUS

## 2019-03-31 MED ORDER — LIDOCAINE HCL (CARDIAC) PF 100 MG/5ML IV SOSY
PREFILLED_SYRINGE | INTRAVENOUS | Status: DC | PRN
  Administered 2019-03-31: 100 mg via INTRAVENOUS

## 2019-03-31 MED ORDER — EPHEDRINE SULFATE 50 MG/ML IJ SOLN
INTRAMUSCULAR | Status: AC
  Filled 2019-03-31: qty 1

## 2019-03-31 MED ORDER — SODIUM CHLORIDE 0.9 % IV SOLN
INTRAVENOUS | Status: DC
  Administered 2019-03-31: 13:00:00 via INTRAVENOUS

## 2019-03-31 MED ORDER — PROPOFOL 10 MG/ML IV BOLUS
INTRAVENOUS | Status: AC
  Filled 2019-03-31: qty 80

## 2019-03-31 MED ORDER — PROPOFOL 500 MG/50ML IV EMUL
INTRAVENOUS | Status: DC | PRN
  Administered 2019-03-31: 100 ug/kg/min via INTRAVENOUS

## 2019-03-31 NOTE — Interval H&P Note (Signed)
History and Physical Interval Note:  03/31/2019 1:36 PM  Jimmy Obrien  has presented today for surgery, with the diagnosis of CONSTIPATION  BLOATING EPIGASTRIC PAIN GERD.  The various methods of treatment have been discussed with the patient and family. After consideration of risks, benefits and other options for treatment, the patient has consented to  Procedure(s): ESOPHAGOGASTRODUODENOSCOPY (EGD) WITH PROPOFOL (N/A) COLONOSCOPY WITH PROPOFOL (N/A) as a surgical intervention.  The patient's history has been reviewed, patient examined, no change in status, stable for surgery.  I have reviewed the patient's chart and labs.  Questions were answered to the patient's satisfaction.     [place], [place]

## 2019-03-31 NOTE — Transfer of Care (Signed)
Immediate Anesthesia Transfer of Care Note  Patient: Jimmy Obrien  Procedure(s) Performed: ESOPHAGOGASTRODUODENOSCOPY (EGD) WITH PROPOFOL (N/A ) COLONOSCOPY WITH PROPOFOL (N/A )  Patient Location: PACU and Endoscopy Unit  Anesthesia Type:General  Level of Consciousness: sedated  Airway & Oxygen Therapy: Patient Spontanous Breathing and Patient connected to nasal cannula oxygen  Post-op Assessment: Report given to RN and Post -op Vital signs reviewed and stable  Post vital signs: Reviewed and stable  Last Vitals:  Vitals Value Taken Time  BP    Temp    Pulse    Resp    SpO2      Last Pain:  Vitals:   03/31/19 1414  TempSrc: (P) Tympanic  PainSc:          Complications: No apparent anesthesia complications

## 2019-03-31 NOTE — Interval H&P Note (Signed)
History and Physical Interval Note:  03/31/2019 12:53 PM  Jimmy Obrien  has presented today for surgery, with the diagnosis of CONSTIPATION  BLOATING EPIGASTRIC PAIN GERD.  The various methods of treatment have been discussed with the patient and family. After consideration of risks, benefits and other options for treatment, the patient has consented to  Procedure(s): ESOPHAGOGASTRODUODENOSCOPY (EGD) WITH PROPOFOL (N/A) COLONOSCOPY WITH PROPOFOL (N/A) as a surgical intervention.  The patient's history has been reviewed, patient examined, no change in status, stable for surgery.  I have reviewed the patient's chart and labs.  Questions were answered to the patient's satisfaction.     [place], [place]

## 2019-03-31 NOTE — H&P (Signed)
Outpatient short stay form Pre-procedure 03/31/2019 12:51 PM Akeisha Lagerquist K. Alice Reichert, M.D.  Primary Physician: Harrel Lemon, M.D.  Reason for visit: Epigastric pain, GERD, chronic gastritis, past history of adenomatous colon polyps.  History of present illness: Patient is a pleasant 73 year old male with a history of chronic gastritis with recent exacerbation.  Patient has been increased on his medication of pantoprazole 40 mg twice daily as well as Pepcid nightly as needed.  Patient also has a past history of adenomatous colon polyps.    Current Facility-Administered Medications:  .  0.9 %  sodium chloride infusion, , Intravenous, Continuous, Mahaley Schwering, Benay Pike, MD  Medications Prior to Admission  Medication Sig Dispense Refill Last Dose  . amphetamine-dextroamphetamine (ADDERALL) 10 MG tablet Take 10 mg by mouth 2 (two) times daily with a meal.     . Camphor-Menthol-Methyl Sal (FLEXALL ULTRA PLUS EX) Apply 1 application topically as needed.     . famotidine (PEPCID) 40 MG tablet Take 40 mg by mouth daily.     . fluticasone (FLOVENT DISKUS) 50 MCG/BLIST diskus inhaler Inhale 1 puff into the lungs 2 (two) times daily.     . Melatonin 5 MG TABS Take 1 tablet by mouth at bedtime.     . senna-docusate (SENOKOT-S) 8.6-50 MG tablet Take 1 tablet by mouth daily.     . Simethicone 180 MG CAPS Take 125 mg by mouth every 6 (six) hours as needed for flatulence.     . sucralfate (CARAFATE) 1 g tablet Take 1 g by mouth 3 (three) times daily with meals as needed.     Marland Kitchen albuterol (PROVENTIL HFA;VENTOLIN HFA) 108 (90 Base) MCG/ACT inhaler Inhale 2 puffs into the lungs every 6 (six) hours as needed for wheezing or shortness of breath.     Marland Kitchen aspirin EC 81 MG tablet Take 81 mg by mouth daily.     Marland Kitchen atenolol (TENORMIN) 25 MG tablet Take 12.5 mg by mouth daily.     Marland Kitchen azelastine (ASTELIN) 0.1 % nasal spray Place 1 spray into both nostrils 2 (two) times daily. Use in each nostril as directed     . budesonide  (RHINOCORT AQUA) 32 MCG/ACT nasal spray Place 1 spray into both nostrils daily.     . Cholecalciferol 10000 units CAPS Take by mouth 1 day or 1 dose.     . citalopram (CELEXA) 20 MG tablet Take 20 mg by mouth daily.     . cyanocobalamin 1000 MCG tablet Take 1,000 mcg by mouth daily.     . diazepam (VALIUM) 5 MG tablet Take 5 mg by mouth 2 (two) times daily.     Marland Kitchen levocetirizine (XYZAL) 5 MG tablet Take 5 mg by mouth every evening.     . meloxicam (MOBIC) 7.5 MG tablet Take 7.5 mg by mouth daily.     . methimazole (TAPAZOLE) 5 MG tablet Take 5 mg by mouth daily.     . Multiple Vitamin (MULTIVITAMIN) tablet Take 1 tablet by mouth daily.     . pantoprazole (PROTONIX) 40 MG tablet Take 40 mg by mouth daily.     . potassium gluconate 595 (99 K) MG TABS tablet Take 595 mg by mouth daily.     . silodosin (RAPAFLO) 8 MG CAPS capsule Take 8 mg by mouth daily with breakfast.        Allergies  Allergen Reactions  . Ciprofloxacin Other (See Comments)    Joint aches   . Penicillins Rash  . Sulfa Antibiotics Rash  .  Tetracyclines & Related Other (See Comments)    Inflammation      Past Medical History:  Diagnosis Date  . BPH (benign prostatic hyperplasia)   . Diverticulitis   . Diverticulosis   . GERD (gastroesophageal reflux disease)   . Hx of atrial fibrillation without current medication   . Hypertension   . Hyperthyroidism    Multinodular Goiter  . Sleep apnea   . Thrombocytopenia ([place])     Review of systems:  Otherwise negative.    Physical Exam  Gen: Alert, oriented. Appears stated age.  HEENT: [place]/AT. PERRLA. Lungs: CTA, no wheezes. CV: RR nl S1, S2. Abd: soft, benign, no masses. BS+ Ext: No edema. Pulses 2+    Planned procedures: Proceed with EGD and colonoscopy. The patient understands the nature of the planned procedure, indications, risks, alternatives and potential complications including but not limited to bleeding, infection, perforation, damage to internal  organs and possible oversedation/side effects from anesthesia. The patient agrees and gives consent to proceed.  Please refer to procedure notes for findings, recommendations and patient disposition/instructions.     Rissa Turley K. Alice Reichert, M.D. Gastroenterology 03/31/2019  12:51 PM

## 2019-03-31 NOTE — Op Note (Signed)
[facility] Gastroenterology Patient Name: Jimmy Obrien Procedure Date: 03/31/2019 1:31 PM MRN: [id number] Account #: [id number] Date of Birth: 09-18-1945 Admit Type: Outpatient Age: 73 Room: [facility] ENDO ROOM 1 Gender: Male Note Status: Finalized Procedure:            Upper GI endoscopy Indications:          Epigastric abdominal pain, Follow-up of esophageal                        reflux, Failure to respond to medical treatment Providers:            Benay Pike. [place] MD, MD Medicines:            Propofol per Anesthesia Complications:        No immediate complications. Procedure:            Pre-Anesthesia Assessment:                       - The risks and benefits of the procedure and the                        sedation options and risks were discussed with the                        patient. All questions were answered and informed                        consent was obtained.                       - Patient identification and proposed procedure were                        verified prior to the procedure by the nurse. The                        procedure was verified in the procedure room.                       - ASA Grade Assessment: III - A patient with severe                        systemic disease.                       - After reviewing the risks and benefits, the patient                        was deemed in satisfactory condition to undergo the                        procedure.                       After obtaining informed consent, the endoscope was                        passed under direct vision. Throughout the procedure,                        the patient's blood pressure, pulse, and oxygen  saturations were monitored continuously. The Endoscope                        was introduced through the mouth, and advanced to the                        third part of duodenum. The upper GI endoscopy was                        accomplished  without difficulty. The patient tolerated                        the procedure well. Findings:      The examined esophagus was normal. 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      Segmental moderate inflammation characterized by erythema and linear       erosions was found in the gastric body and in the gastric antrum.       Biopsies were taken with a cold forceps for histology.      The examined duodenum was normal.      The exam was otherwise without abnormality. Impression:           - Normal esophagus. Biopsied.                       - Mucosal changes suspicious for gastritis. Biopsied.                       - Normal examined duodenum.                       - The examination was otherwise normal. Recommendation:       - Await pathology results.                       - Proceed with colonoscopy Procedure Code(s):    --- Professional ---                       [phone], Esophagogastroduodenoscopy, flexible, transoral;                        with biopsy, single or multiple Diagnosis Code(s):    --- Professional ---                       K21.9, Gastro-esophageal reflux disease without                        esophagitis                       R10.13, Epigastric pain                       K31.89, Other diseases of stomach and duodenum CPT copyright 2019 American Medical Association. All rights reserved. The codes documented in this report are preliminary and upon coder review may  be revised to meet current compliance requirements. Efrain Sella MD, MD 03/31/2019 1:54:46 PM This report has been signed electronically. Number of Addenda: 0 Note Initiated On: 03/31/2019 1:31 PM Estimated Blood  Loss: Estimated blood loss was minimal.      [facility]

## 2019-03-31 NOTE — Anesthesia Post-op Follow-up Note (Signed)
Anesthesia QCDR form completed.        

## 2019-03-31 NOTE — Op Note (Signed)
[facility] Gastroenterology Patient Name: Jimmy Obrien Procedure Date: 03/31/2019 1:31 PM MRN: [id number] Account #: [id number] Date of Birth: Nov 25, 1945 Admit Type: Outpatient Age: 73 Room: [facility] ENDO ROOM 1 Gender: Male Note Status: Finalized Procedure:            Colonoscopy Indications:          High risk colon cancer surveillance: Personal history                        of colonic polyps Providers:            Benay Pike. [place] MD, MD Medicines:            Propofol per Anesthesia Complications:        No immediate complications. Procedure:            Pre-Anesthesia Assessment:                       - The risks and benefits of the procedure and the                        sedation options and risks were discussed with the                        patient. All questions were answered and informed                        consent was obtained.                       - Patient identification and proposed procedure were                        verified prior to the procedure by the nurse. The                        procedure was verified in the procedure room.                       - ASA Grade Assessment: III - A patient with severe                        systemic disease.                       - After reviewing the risks and benefits, the patient                        was deemed in satisfactory condition to undergo the                        procedure.                       After obtaining informed consent, the colonoscope was                        passed under direct vision. Throughout the procedure,                        the patient's blood pressure, pulse, and oxygen  saturations were monitored continuously. The                        Colonoscope was introduced through the anus and                        advanced to the 5 cm into the ileum. The colonoscopy                        was performed without difficulty. The patient tolerated                 the procedure well. The quality of the bowel                        preparation was excellent. Ileocolonic anastomosis were                        photographed. Findings:      The perianal and digital rectal examinations were normal. Pertinent       negatives include normal sphincter tone and no palpable rectal lesions.      Multiple small and large-mouthed diverticula were found in the entire       colon.      A 4 mm polyp was found in the transverse colon. The polyp was sessile.       The polyp was removed with a jumbo cold forceps. Resection and retrieval       were complete.      Two sessile polyps were found in the transverse colon. The polyps were 5       to 6 mm in size. These polyps were removed with a cold snare. Resection       and retrieval were complete.      Non-bleeding internal hemorrhoids were found during retroflexion. The       hemorrhoids were Grade II (internal hemorrhoids that prolapse but reduce       spontaneously).      The exam was otherwise without abnormality.      There was evidence of a prior end-to-side ileo-colonic anastomosis in       the ascending colon. This was patent and was characterized by healthy       appearing mucosa. The anastomosis was traversed. Impression:           - Diverticulosis in the entire examined colon.                       - One 4 mm polyp in the transverse colon, removed with                        a jumbo cold forceps. Resected and retrieved.                       - Two 5 to 6 mm polyps in the transverse colon, removed                        with a cold snare. Resected and retrieved.                       - Non-bleeding internal hemorrhoids.                       -  The examination was otherwise normal.                       - Patent end-to-side ileo-colonic anastomosis,                        characterized by healthy appearing mucosa. Recommendation:       - Await pathology results from EGD, also performed                         today.                       - Patient has a contact number available for                        emergencies. The signs and symptoms of potential                        delayed complications were discussed with the patient.                        Return to normal activities tomorrow. Written discharge                        instructions were provided to the patient.                       - Resume previous diet.                       - Continue present medications.                       - Repeat colonoscopy is recommended for surveillance.                        The colonoscopy date will be determined after pathology                        results from today's exam become available for review.                       - Return to physician assistant in 6 weeks.                       - Please follow up with Tammi Klippel, PA-C for your                        visit to [facility]. Of course, I                        remain available to you if you need any help.                       - The findings and recommendations were discussed with                        the patient. Procedure Code(s):    --- Professional ---  45385, Colonoscopy, flexible; with removal of tumor(s),                        polyp(s), or other lesion(s) by snare technique                       45380, 59, Colonoscopy, flexible; with biopsy, single                        or multiple Diagnosis Code(s):    --- Professional ---                       K57.30, Diverticulosis of large intestine without                        perforation or abscess without bleeding                       K64.1, Second degree hemorrhoids                       Z98.0, Intestinal bypass and anastomosis status                       Z86.010, Personal history of colonic polyps                       K63.5, Polyp of colon CPT copyright 2019 American Medical Association. All rights reserved. The codes  documented in this report are preliminary and upon coder review may  be revised to meet current compliance requirements. Efrain Sella MD, MD 03/31/2019 2:17:51 PM This report has been signed electronically. Number of Addenda: 0 Note Initiated On: 03/31/2019 1:31 PM Scope Withdrawal Time: 0 hours 8 minutes 38 seconds  Total Procedure Duration: 0 hours 12 minutes 6 seconds  Estimated Blood Loss: Estimated blood loss was minimal.      [facility]

## 2019-03-31 NOTE — Anesthesia Preprocedure Evaluation (Signed)
Anesthesia Evaluation  Patient identified by MRN, date of birth, ID band Patient awake    Reviewed: Allergy & Precautions, H&P , NPO status , reviewed documented beta blocker date and time   Airway Mallampati: II  TM Distance: >3 FB Neck ROM: full    Dental  (+) Teeth Intact   Pulmonary sleep apnea ,  Rare CPAP use, counseled re GA & OSA risk   Pulmonary exam normal        Cardiovascular hypertension, Normal cardiovascular examAtrial Fibrillation   Hx Afib, NSR now   Neuro/Psych    GI/Hepatic GERD  Controlled,  Endo/Other  Hyperthyroidism   Renal/GU      Musculoskeletal   Abdominal   Peds  Hematology   Anesthesia Other Findings Past Medical History: No date: BPH (benign prostatic hyperplasia) No date: Diverticulitis No date: Diverticulosis No date: GERD (gastroesophageal reflux disease) No date: Hx of atrial fibrillation without current medication No date: Hypertension No date: Hyperthyroidism     Comment:  Multinodular Goiter No date: Sleep apnea No date: Thrombocytopenia ([place])  Past Surgical History: No date: CHOLECYSTECTOMY No date: COLON RESECTION 08/16/2016: ESOPHAGOGASTRODUODENOSCOPY (EGD) WITH PROPOFOL; N/A     Comment:  Procedure: ESOPHAGOGASTRODUODENOSCOPY (EGD) WITH               PROPOFOL;  Surgeon: Manya Silvas, MD;  Location: [place];  Service: Endoscopy;  Laterality: N/A; No date: TRANSURETHRAL RESECTION OF PROSTATE  BMI    Body Mass Index: 29.86 kg/m      Reproductive/Obstetrics                             Anesthesia Physical Anesthesia Plan  ASA: III  Anesthesia Plan: General   Post-op Pain Management:    Induction: Intravenous  PONV Risk Score and Plan: 2 and Treatment may vary due to age or medical condition and TIVA  Airway Management Planned: Nasal Cannula and Natural Airway  Additional Equipment:   Intra-op Plan:    Post-operative Plan:   Informed Consent: I have reviewed the patients History and Physical, chart, labs and discussed the procedure including the risks, benefits and alternatives for the proposed anesthesia with the patient or authorized representative who has indicated his/her understanding and acceptance.     Dental Advisory Given  Plan Discussed with: CRNA  Anesthesia Plan Comments:         Anesthesia Quick Evaluation

## 2019-03-31 NOTE — Brief Op Note (Signed)
Right colectomy, extent reached.

## 2019-04-01 ENCOUNTER — Encounter: Payer: Self-pay | Admitting: Internal Medicine

## 2019-04-02 LAB — SURGICAL PATHOLOGY

## 2019-04-04 NOTE — Anesthesia Postprocedure Evaluation (Signed)
Anesthesia Post Note  Patient: Jimmy Obrien  Procedure(s) Performed: ESOPHAGOGASTRODUODENOSCOPY (EGD) WITH PROPOFOL (N/A ) COLONOSCOPY WITH PROPOFOL (N/A )  Patient location during evaluation: Endoscopy Anesthesia Type: General Level of consciousness: awake and alert Pain management: pain level controlled Vital Signs Assessment: post-procedure vital signs reviewed and stable Respiratory status: spontaneous breathing, nonlabored ventilation and respiratory function stable Cardiovascular status: blood pressure returned to baseline and stable Postop Assessment: no apparent nausea or vomiting Anesthetic complications: no     Last Vitals:  Vitals:   03/31/19 1437 03/31/19 1439  BP: 112/70 112/70  Pulse: (!) 56 65  Resp: 14 16  Temp:    SpO2: 99% 99%    Last Pain:  Vitals:   04/01/19 0747  TempSrc:   PainSc: 0-No pain                 Alphonsus Sias

## 2019-04-17 DIAGNOSIS — J4530 Mild persistent asthma, uncomplicated: Secondary | ICD-10-CM | POA: Diagnosis not present

## 2019-04-17 DIAGNOSIS — J3089 Other allergic rhinitis: Secondary | ICD-10-CM | POA: Diagnosis not present

## 2019-04-23 DIAGNOSIS — E0520 Thyrotoxicosis with toxic multinodular goiter without thyrotoxic crisis or storm: Secondary | ICD-10-CM | POA: Diagnosis not present

## 2019-04-30 DIAGNOSIS — E0520 Thyrotoxicosis with toxic multinodular goiter without thyrotoxic crisis or storm: Secondary | ICD-10-CM | POA: Diagnosis not present

## 2019-05-11 ENCOUNTER — Encounter: Payer: Self-pay | Admitting: Emergency Medicine

## 2019-05-11 ENCOUNTER — Other Ambulatory Visit: Payer: Self-pay

## 2019-05-11 ENCOUNTER — Emergency Department: Payer: PPO

## 2019-05-11 ENCOUNTER — Emergency Department
Admission: EM | Admit: 2019-05-11 | Discharge: 2019-05-11 | Disposition: A | Discharge status: Home / Self Care | Payer: PPO | Attending: Emergency Medicine | Admitting: Emergency Medicine

## 2019-05-11 DIAGNOSIS — Z882 Allergy status to sulfonamides status: Secondary | ICD-10-CM | POA: Diagnosis not present

## 2019-05-11 DIAGNOSIS — N132 Hydronephrosis with renal and ureteral calculous obstruction: Secondary | ICD-10-CM | POA: Diagnosis not present

## 2019-05-11 DIAGNOSIS — Z880 Allergy status to penicillin: Secondary | ICD-10-CM | POA: Diagnosis not present

## 2019-05-11 DIAGNOSIS — R109 Unspecified abdominal pain: Secondary | ICD-10-CM | POA: Diagnosis not present

## 2019-05-11 DIAGNOSIS — Z79899 Other long term (current) drug therapy: Secondary | ICD-10-CM | POA: Diagnosis not present

## 2019-05-11 DIAGNOSIS — Z7982 Long term (current) use of aspirin: Secondary | ICD-10-CM | POA: Insufficient documentation

## 2019-05-11 DIAGNOSIS — K5730 Diverticulosis of large intestine without perforation or abscess without bleeding: Secondary | ICD-10-CM | POA: Diagnosis not present

## 2019-05-11 DIAGNOSIS — R1084 Generalized abdominal pain: Secondary | ICD-10-CM | POA: Diagnosis not present

## 2019-05-11 DIAGNOSIS — I10 Essential (primary) hypertension: Secondary | ICD-10-CM | POA: Diagnosis not present

## 2019-05-11 DIAGNOSIS — Z881 Allergy status to other antibiotic agents status: Secondary | ICD-10-CM | POA: Insufficient documentation

## 2019-05-11 DIAGNOSIS — E039 Hypothyroidism, unspecified: Secondary | ICD-10-CM | POA: Diagnosis not present

## 2019-05-11 DIAGNOSIS — R1032 Left lower quadrant pain: Secondary | ICD-10-CM | POA: Diagnosis present

## 2019-05-11 DIAGNOSIS — K760 Fatty (change of) liver, not elsewhere classified: Secondary | ICD-10-CM | POA: Diagnosis not present

## 2019-05-11 LAB — URINALYSIS, COMPLETE (UACMP) WITH MICROSCOPIC
Bacteria, UA: NONE SEEN
Bilirubin Urine: NEGATIVE
Glucose, UA: 150 mg/dL — AB
Ketones, ur: NEGATIVE mg/dL
Leukocytes,Ua: NEGATIVE
Nitrite: NEGATIVE
Protein, ur: 30 mg/dL — AB
RBC / HPF: >50 RBC/hpf — ABNORMAL HIGH (ref 0–5)
Specific Gravity, Urine: 1.027 (ref 1.005–1.030)
Squamous Epithelial / LPF: NONE SEEN (ref 0–5)
pH: 6 (ref 5.0–8.0)

## 2019-05-11 LAB — COMPREHENSIVE METABOLIC PANEL
ALT: 34 U/L (ref 0–44)
AST: 31 U/L (ref 15–41)
Albumin: 4.7 g/dL (ref 3.5–5.0)
Alkaline Phosphatase: 68 U/L (ref 38–126)
Anion gap: 9 (ref 5–15)
BUN: 19 mg/dL (ref 8–23)
CO2: 27 mmol/L (ref 22–32)
Calcium: 10 mg/dL (ref 8.9–10.3)
Chloride: 101 mmol/L (ref 98–111)
Creatinine, Ser: 1.35 mg/dL — ABNORMAL HIGH (ref 0.61–1.24)
GFR calc Af Amer: 59 mL/min — ABNORMAL LOW (ref >60)
GFR calc non Af Amer: 51 mL/min — ABNORMAL LOW (ref >60)
Glucose, Bld: 141 mg/dL — ABNORMAL HIGH (ref 70–99)
Potassium: 4.4 mmol/L (ref 3.5–5.1)
Sodium: 137 mmol/L (ref 135–145)
Total Bilirubin: 0.5 mg/dL (ref 0.3–1.2)
Total Protein: 7.9 g/dL (ref 6.5–8.1)

## 2019-05-11 LAB — CBC
HCT: 47.1 % (ref 39.0–52.0)
Hemoglobin: 16 g/dL (ref 13.0–17.0)
MCH: 31.3 pg (ref 26.0–34.0)
MCHC: 34 g/dL (ref 30.0–36.0)
MCV: 92 fL (ref 80.0–100.0)
Platelets: 147 10*3/uL — ABNORMAL LOW (ref 150–400)
RBC: 5.12 MIL/uL (ref 4.22–5.81)
RDW: 12.3 % (ref 11.5–15.5)
WBC: 12.4 10*3/uL — ABNORMAL HIGH (ref 4.0–10.5)
nRBC: 0 % (ref 0.0–0.2)

## 2019-05-11 LAB — LIPASE, BLOOD: Lipase: 25 U/L (ref 11–51)

## 2019-05-11 MED ORDER — OXYCODONE-ACETAMINOPHEN 5-325 MG PO TABS
1.0000 | ORAL_TABLET | Freq: Once | ORAL | Status: AC
  Administered 2019-05-11: 1 via ORAL
  Filled 2019-05-11: qty 1

## 2019-05-11 MED ORDER — TAMSULOSIN HCL 0.4 MG PO CAPS: 0.8000 mg | ORAL_CAPSULE | Freq: Every day | ORAL | 0 refills | Status: DC

## 2019-05-11 MED ORDER — SODIUM CHLORIDE 0.9% FLUSH: 3.0000 mL | Freq: Once | INTRAVENOUS | Status: DC

## 2019-05-11 MED ORDER — OXYCODONE-ACETAMINOPHEN 5-325 MG PO TABS: 1.0000 | ORAL_TABLET | Freq: Four times a day (QID) | ORAL | 0 refills | Status: DC | PRN

## 2019-05-11 MED ORDER — TAMSULOSIN HCL 0.4 MG PO CAPS
0.4000 mg | ORAL_CAPSULE | Freq: Once | ORAL | Status: AC
  Administered 2019-05-11: 0.4 mg via ORAL
  Filled 2019-05-11: qty 1

## 2019-05-11 NOTE — ED Notes (Signed)
Pt verbalized understanding of discharge instructions. NAD at this time. 

## 2019-05-11 NOTE — ED Provider Notes (Signed)
[facility] Emergency Department Provider Note  ____________________________________________  Time seen: Approximately 8:12 PM  I have reviewed the triage vital signs and the nursing notes.   HISTORY  Chief Complaint Abdominal Pain    HPI Jimmy Obrien is a 73 y.o. male who presents the emergency department complaining of sharp left flank and left-sided abdominal pain.  Patient reports that he woke up this morning to urinate, was experiencing symptoms that have only increased since then.  Patient reports that today he is only urinated twice which is atypical for him.  He is experiencing worsening pain throughout the day.  Patient presented to urgent care, was referred to the emergency department for further evaluation.  Patient denies any fevers or chills, chest pain, shortness of breath.  Patient has had nausea and emesis x1.  Patient denies any hematuria.  No diarrhea, constipation.  No hematochezia.  Emesis was nonbilious and nonbloody.  No medications for his complaint prior to arrival.  Patient has a history of BPH, diverticulitis, GERD, A. fib, hypertension, hypothyroidism, sleep apnea and thrombocytopenia.         Past Medical History:  Diagnosis Date  . BPH (benign prostatic hyperplasia)   . Diverticulitis   . Diverticulosis   . GERD (gastroesophageal reflux disease)   . Hx of atrial fibrillation without current medication   . Hypertension   . Hyperthyroidism    Multinodular Goiter  . Sleep apnea   . Thrombocytopenia ([place])     There are no active problems to display for this patient.   Past Surgical History:  Procedure Laterality Date  . CHOLECYSTECTOMY    . COLON RESECTION    . COLONOSCOPY WITH PROPOFOL N/A 03/31/2019   Procedure: COLONOSCOPY WITH PROPOFOL;  Surgeon: Toledo, Benay Pike, MD;  Location: [facility] ENDOSCOPY;  Service: Endoscopy;  Laterality: N/A;  . ESOPHAGOGASTRODUODENOSCOPY (EGD) WITH PROPOFOL N/A 08/16/2016   Procedure:  ESOPHAGOGASTRODUODENOSCOPY (EGD) WITH PROPOFOL;  Surgeon: Manya Silvas, MD;  Location: [facility] ENDOSCOPY;  Service: Endoscopy;  Laterality: N/A;  . ESOPHAGOGASTRODUODENOSCOPY (EGD) WITH PROPOFOL N/A 03/31/2019   Procedure: ESOPHAGOGASTRODUODENOSCOPY (EGD) WITH PROPOFOL;  Surgeon: Toledo, Benay Pike, MD;  Location: [facility] ENDOSCOPY;  Service: Endoscopy;  Laterality: N/A;  . TRANSURETHRAL RESECTION OF PROSTATE      Prior to Admission medications   Medication Sig Start Date End Date Taking? Authorizing Provider  albuterol (PROVENTIL HFA;VENTOLIN HFA) 108 (90 Base) MCG/ACT inhaler Inhale 2 puffs into the lungs every 6 (six) hours as needed for wheezing or shortness of breath.    [provider]  amphetamine-dextroamphetamine (ADDERALL) 10 MG tablet Take 10 mg by mouth 2 (two) times daily with a meal.    [provider]  aspirin EC 81 MG tablet Take 81 mg by mouth daily.    [provider]  atenolol (TENORMIN) 25 MG tablet Take 12.5 mg by mouth daily.    [provider]  azelastine (ASTELIN) 0.1 % nasal spray Place 1 spray into both nostrils 2 (two) times daily. Use in each nostril as directed    [provider]  budesonide (RHINOCORT AQUA) 32 MCG/ACT nasal spray Place 1 spray into both nostrils daily.    [provider]  Camphor-Menthol-Methyl Sal (FLEXALL ULTRA PLUS EX) Apply 1 application topically as needed.    [provider]  Cholecalciferol 10000 units CAPS Take by mouth 1 day or 1 dose.    [provider]  citalopram (CELEXA) 20 MG tablet Take 20 mg by mouth daily.  [provider]  cyanocobalamin 1000 MCG tablet Take 1,000 mcg by mouth daily.    [provider]  diazepam (VALIUM) 5 MG tablet Take 5 mg by mouth 2 (two) times daily.    [provider]  famotidine (PEPCID) 40 MG tablet Take 40 mg by mouth daily.    [provider]  fluticasone (FLOVENT DISKUS) 50 MCG/BLIST diskus inhaler  Inhale 1 puff into the lungs 2 (two) times daily.    [provider]  levocetirizine (XYZAL) 5 MG tablet Take 5 mg by mouth every evening.    [provider]  Melatonin 5 MG TABS Take 1 tablet by mouth at bedtime.    [provider]  meloxicam (MOBIC) 7.5 MG tablet Take 7.5 mg by mouth daily.    [provider]  methimazole (TAPAZOLE) 5 MG tablet Take 5 mg by mouth daily.    [provider]  Multiple Vitamin (MULTIVITAMIN) tablet Take 1 tablet by mouth daily.    [provider]  oxyCODONE-acetaminophen (PERCOCET/ROXICET) 5-325 MG tablet Take 1 tablet by mouth every 6 (six) hours as needed for severe pain. 05/11/19   Cuthriell, Charline Bills, PA-C  pantoprazole (PROTONIX) 40 MG tablet Take 40 mg by mouth daily.    [provider]  potassium gluconate 595 (99 K) MG TABS tablet Take 595 mg by mouth daily.    [provider]  senna-docusate (SENOKOT-S) 8.6-50 MG tablet Take 1 tablet by mouth daily.    [provider]  silodosin (RAPAFLO) 8 MG CAPS capsule Take 8 mg by mouth daily with breakfast.    [provider]  Simethicone 180 MG CAPS Take 125 mg by mouth every 6 (six) hours as needed for flatulence.    [provider]  sucralfate (CARAFATE) 1 g tablet Take 1 g by mouth 3 (three) times daily with meals as needed.    [provider]  tamsulosin (FLOMAX) 0.4 MG CAPS capsule Take 2 capsules (0.8 mg total) by mouth daily. 05/11/19   Cuthriell, Charline Bills, PA-C    Allergies Ciprofloxacin, Penicillins, Sulfa antibiotics, and Tetracyclines & related  No family history on file.  Social History Social History   Tobacco Use  . Smoking status: Never Smoker  . Smokeless tobacco: Never Used  Substance Use Topics  . Alcohol use: Yes    Comment: socially  . Drug use: No     Review of Systems  Constitutional: No fever/chills Eyes: No visual changes. No discharge ENT: No upper respiratory  complaints. Cardiovascular: no chest pain. Respiratory: no cough. No SOB. Gastrointestinal: Positive for left-sided abdominal pain.  Positive for nausea and emesis.  No diarrhea.  No constipation. Genitourinary: Negative for dysuria. No hematuria.  Positive for left flank pain, decreased urination Musculoskeletal: Negative for musculoskeletal pain. Skin: Negative for rash, abrasions, lacerations, ecchymosis. Neurological: Negative for headaches, focal weakness or numbness. 10-point ROS otherwise negative.  ____________________________________________   PHYSICAL EXAM:  VITAL SIGNS: ED Triage Vitals [05/11/19 1623]  Enc Vitals Group     BP 128/72     Pulse Rate 61     Resp 16     Temp 98.4 F (36.9 C)     Temp Source Oral     SpO2 100 %     Weight 185 lb (83.9 kg)     Height 5\' 6"  (1.676 m)     Head Circumference      Peak Flow      Pain Score 7  Pain Loc      Pain Edu?      Excl. in [place]?      Constitutional: Alert and oriented. Well appearing and in no acute distress. Eyes: Conjunctivae are normal. PERRL. EOMI. Head: Atraumatic. ENT:      Ears:       Nose: No congestion/rhinnorhea.      Mouth/Throat: Mucous membranes are moist.  Neck: No stridor.    Cardiovascular: Normal rate, regular rhythm. Normal S1 and S2.  Good peripheral circulation. Respiratory: Normal respiratory effort without tachypnea or retractions. Lungs CTAB. Good air entry to the bases with no decreased or absent breath sounds. Gastrointestinal: Bowel sounds 4 quadrants. Soft and nontender to palpation. No guarding or rigidity. No palpable masses. No distention. No CVA tenderness. Musculoskeletal: Full range of motion to all extremities. No gross deformities appreciated. Neurologic:  Normal speech and language. No gross focal neurologic deficits are appreciated.  Skin:  Skin is warm, dry and intact. No rash noted. Psychiatric: Mood and affect are normal. Speech and behavior are normal. Patient  exhibits appropriate insight and judgement.   ____________________________________________   LABS (all labs ordered are listed, but only abnormal results are displayed)  Labs Reviewed  COMPREHENSIVE METABOLIC PANEL - Abnormal; Notable for the following components:      Result Value   Glucose, Bld 141 (*)    Creatinine, Ser 1.35 (*)    GFR calc non Af Amer 51 (*)    GFR calc Af Amer 59 (*)    All other components within normal limits  CBC - Abnormal; Notable for the following components:   WBC 12.4 (*)    Platelets 147 (*)    All other components within normal limits  URINALYSIS, COMPLETE (UACMP) WITH MICROSCOPIC - Abnormal; Notable for the following components:   Color, Urine YELLOW (*)    APPearance HAZY (*)    Glucose, UA 150 (*)    Hgb urine dipstick MODERATE (*)    Protein, ur 30 (*)    RBC / HPF >50 (*)    All other components within normal limits  LIPASE, BLOOD   ____________________________________________  EKG   ____________________________________________  RADIOLOGY I personally viewed and evaluated these images as part of my medical decision making, as well as reviewing the written report by the radiologist.  Ct Abdomen Pelvis Wo Contrast  Result Date: 05/11/2019 CLINICAL DATA:  Abdominal pain with diverticulitis suspected. EXAM: CT ABDOMEN AND PELVIS WITHOUT CONTRAST TECHNIQUE: Multidetector CT imaging of the abdomen and pelvis was performed following the standard protocol without IV contrast. COMPARISON:  Jan 16, 2017 FINDINGS: Lower chest: The lung bases are clear. The heart size is normal. Hepatobiliary: There is decreased hepatic attenuation suggestive of hepatic steatosis. Status post cholecystectomy.There is no biliary ductal dilation. Pancreas: Normal contours without ductal dilatation. No peripancreatic fluid collection. Spleen: No splenic laceration or hematoma. Adrenals/Urinary Tract: --Adrenal glands: No adrenal hemorrhage. --Right kidney/ureter: There  is a punctate nonobstructing stone in the interpolar region of the right kidney. --Left kidney/ureter: There is moderate left-sided hydroureteronephrosis secondary to an obstructing 6-7 mm stone in the mid left ureter. Left-sided renal cysts are noted, some of which are somewhat hyperdense. --Urinary bladder: Unremarkable. Stomach/Bowel: --Stomach/Duodenum: Duodenal diverticula are noted. The stomach is unremarkable. --Small bowel: No dilatation or inflammation. --Colon: Rectosigmoid diverticulosis without acute inflammation. The patient appears to be status post right hemicolectomy. --Appendix: Surgically absent. Vascular/Lymphatic: Atherosclerotic calcification is present within the non-aneurysmal abdominal aorta, without hemodynamically significant stenosis. --No retroperitoneal lymphadenopathy. --No  mesenteric lymphadenopathy. --No pelvic or inguinal lymphadenopathy. Reproductive: Unremarkable Other: No ascites or free air. There are bilateral fat containing inguinal hernias, left greater than right. The left inguinal hernia contains a small portion of the bladder dome. Musculoskeletal. No acute displaced fractures. IMPRESSION: 1. Moderate left-sided hydroureteronephrosis secondary to the 7 mm obstructing stone in the mid left ureter. 2. Hepatic steatosis. 3. Status post cholecystectomy. 4. Diverticulosis without CT evidence for diverticulitis. The patient is status post right hemicolectomy. Electronically Signed   By: Constance Holster M.D.   On: 05/11/2019 17:30    ____________________________________________    PROCEDURES  Procedure(s) performed:    Procedures    Medications  sodium chloride flush (NS) 0.9 % injection 3 mL (has no administration in time range)  tamsulosin (FLOMAX) capsule 0.4 mg (has no administration in time range)  oxyCODONE-acetaminophen (PERCOCET/ROXICET) 5-325 MG per tablet 1 tablet (has no administration in time range)  oxyCODONE-acetaminophen (PERCOCET/ROXICET)  5-325 MG per tablet 1 tablet (1 tablet Oral Given 05/11/19 2022)     ____________________________________________   INITIAL IMPRESSION / ASSESSMENT AND PLAN / ED COURSE  Pertinent labs & imaging results that were available during my care of the patient were reviewed by me and considered in my medical decision making (see chart for details).  Review of the [place] CSRS was performed in accordance of the [place] prior to dispensing any controlled drugs.           Patient's diagnosis is consistent with obstructing left-sided kidney stone.  Patient presented to emergency department with sharp left flank and left-sided abdominal pain.  Patient was originally seen in urgent care and referred to the emergency department as patient has a history of diverticulitis.  Patient states that he has had no fevers or chills, diarrhea or constipation.  Overall, labs are reassuring.  Patient does have evidence of obstructing nephrolithiasis in the left ureter with hydroureter nephrosis.  Given this finding, I discussed the patient with urologist, Dr. Diamantina Providence.  After reviewing the chart, urology advises that patient is a good candidate for discharge with pain medication, Flomax and follow-up in the office.  I have given strict return precautions to the patient to return for fever, chills, increased pain, decreased urination or other concerning symptoms.  Otherwise, patient will be discharged with Flomax, pain medication and follow-up with urology.  Patient is given ED precautions to return to the ED for any worsening or new symptoms.     ____________________________________________  FINAL CLINICAL IMPRESSION(S) / ED DIAGNOSES  Final diagnoses:  Hydronephrosis with obstructing calculus      NEW MEDICATIONS STARTED DURING THIS VISIT:  ED Discharge Orders         Ordered    oxyCODONE-acetaminophen (PERCOCET/ROXICET) 5-325 MG tablet  Every 6 hours PRN     05/11/19 2211    tamsulosin (FLOMAX) 0.4 MG CAPS  capsule  Daily     05/11/19 2211              This chart was dictated using voice recognition software/Dragon. Despite best efforts to proofread, errors can occur which can change the meaning. Any change was purely unintentional.    Darletta Moll, PA-C 05/11/19 2213    Vanessa [place], MD 05/13/19 [phone]

## 2019-05-11 NOTE — ED Triage Notes (Signed)
Sent for ED evaluation of abdominal pain from [facility].  Patient is AAOx3.  Skin warm and dry. NAD.  C/O abdominal pain since around 0300.  Reports one episode of emesis.

## 2019-05-11 NOTE — ED Triage Notes (Signed)
Pt to ED via POV c/o LLQ abdominal pain and left lower back pain. Pt states that he woke up this morning with the pain. Pt was seen at [facility] and they did an x-ray but felt like the patient needed to come to ED for more evaluation. Pt denies diarrhea but states that he did vomit x 1 earlier today. Pt is in NAD.

## 2019-05-12 ENCOUNTER — Telehealth: Payer: Self-pay | Admitting: Urology

## 2019-05-12 NOTE — Telephone Encounter (Signed)
-----   Message from Billey Co, MD sent at 05/11/2019  8:29 PM EDT ----- Regarding: Acute stone follow up Acute stone patient from ED Sunday night. Please set up follow up with me in [place] Tuesday to discuss stone treatment options.  Thanks,  Nickolas Madrid, MD 05/11/2019

## 2019-05-12 NOTE — Telephone Encounter (Signed)
-----   Message from Benard Halsted sent at 05/12/2019  8:21 AM EDT ----- Regarding: RE: Acute stone follow up Left message to cb to schedule app today in [facility] or tomorrow in [facility] ----- Message ----- From: Billey Co, MD Sent: 05/11/2019   8:29 PM EDT To: Gerhard Perches Subject: Acute stone follow up                          Acute stone patient from ED Sunday night. Please set up follow up with me in [place] Tuesday to discuss stone treatment options.  Thanks,  Nickolas Madrid, MD 05/11/2019

## 2019-05-12 NOTE — Telephone Encounter (Signed)
Left message for patient to call back to schedule app in [facility] or [place] today or tomorrow.   Sharyn Lull

## 2019-05-13 ENCOUNTER — Other Ambulatory Visit: Payer: Self-pay

## 2019-05-13 ENCOUNTER — Ambulatory Visit (INDEPENDENT_AMBULATORY_CARE_PROVIDER_SITE_OTHER): Payer: Self-pay | Admitting: Urology

## 2019-05-13 ENCOUNTER — Other Ambulatory Visit: Payer: Self-pay | Admitting: Radiology

## 2019-05-13 ENCOUNTER — Encounter: Payer: Self-pay | Admitting: Urology

## 2019-05-13 ENCOUNTER — Other Ambulatory Visit
Admission: RE | Admit: 2019-05-13 | Discharge: 2019-05-13 | Disposition: A | Discharge status: Home / Self Care | Payer: PPO | Source: Ambulatory Visit | Attending: Urology | Admitting: Urology

## 2019-05-13 ENCOUNTER — Other Ambulatory Visit
Admission: RE | Admit: 2019-05-13 | Discharge: 2019-05-13 | Disposition: A | Discharge status: Home / Self Care | Payer: PPO | Attending: Urology | Admitting: Urology

## 2019-05-13 VITALS — BP 111/75 | HR 78 | Ht 66.5 in | Wt 185.0 lb

## 2019-05-13 DIAGNOSIS — K219 Gastro-esophageal reflux disease without esophagitis: Secondary | ICD-10-CM | POA: Insufficient documentation

## 2019-05-13 DIAGNOSIS — N201 Calculus of ureter: Secondary | ICD-10-CM | POA: Diagnosis not present

## 2019-05-13 DIAGNOSIS — R9431 Abnormal electrocardiogram [ECG] [EKG]: Secondary | ICD-10-CM | POA: Insufficient documentation

## 2019-05-13 DIAGNOSIS — R350 Frequency of micturition: Secondary | ICD-10-CM | POA: Diagnosis not present

## 2019-05-13 DIAGNOSIS — N400 Enlarged prostate without lower urinary tract symptoms: Secondary | ICD-10-CM | POA: Insufficient documentation

## 2019-05-13 DIAGNOSIS — I10 Essential (primary) hypertension: Secondary | ICD-10-CM | POA: Insufficient documentation

## 2019-05-13 DIAGNOSIS — I4891 Unspecified atrial fibrillation: Secondary | ICD-10-CM | POA: Insufficient documentation

## 2019-05-13 DIAGNOSIS — D696 Thrombocytopenia, unspecified: Secondary | ICD-10-CM | POA: Insufficient documentation

## 2019-05-13 LAB — URINALYSIS, COMPLETE (UACMP) WITH MICROSCOPIC
Bacteria, UA: NONE SEEN
Bilirubin Urine: NEGATIVE
Glucose, UA: NEGATIVE mg/dL
Ketones, ur: NEGATIVE mg/dL
Leukocytes,Ua: NEGATIVE
Nitrite: NEGATIVE
Protein, ur: NEGATIVE mg/dL
Specific Gravity, Urine: 1.015 (ref 1.005–1.030)
Squamous Epithelial / HPF: NONE SEEN (ref 0–5)
pH: 6 (ref 5.0–8.0)

## 2019-05-13 LAB — SARS CORONAVIRUS 2 (TAT 6-24 HRS): SARS Coronavirus 2: NEGATIVE

## 2019-05-13 NOTE — Addendum Note (Signed)
Addended by: Donalee Citrin on: 05/13/2019 02:54 PM   Modules accepted: Orders

## 2019-05-13 NOTE — Progress Notes (Signed)
05/13/19 1:21 PM   [name] [birth year] [id number]  Referring provider: Baxter Hire, MD [place],  [place] [id number]  CC: Left 72mm mid ureteral stone  HPI: I saw Mr. Medvec in urology clinic today for evaluation of a left 9 mm mid ureteral stone.  He is a relatively healthy 73 year old male that was seen in the emergency department on 05/11/2019 with acute onset of severe left-sided flank and groin pain associated with nausea and single episode of emesis.  CT at that time showed a 13mm mid left ureteral stone with upstream hydronephrosis.  There were no other stones.  Urinalysis was non-infected and he was discharged home with medical expulsive therapy.  He continues to have left-sided flank and groin pain when his pain medications wear off.  He denies any fevers, chills, or gross hematuria.  He reports 4-5 prior episodes of nephrolithiasis, all of which passed spontaneously.  He also has a history of a TURP in the 90s.  There are no aggravating factors.  Severity is moderate.  Of note, the patient would like to avoid general anesthesia if at all possible.   PMH: Past Medical History:  Diagnosis Date  . BPH (benign prostatic hyperplasia)   . Diverticulitis   . Diverticulosis   . GERD (gastroesophageal reflux disease)   . Hx of atrial fibrillation without current medication   . Hypertension   . Hyperthyroidism    Multinodular Goiter  . Sleep apnea   . Thrombocytopenia [facility])     Surgical History: Past Surgical History:  Procedure Laterality Date  . CHOLECYSTECTOMY    . COLON RESECTION    . COLONOSCOPY WITH PROPOFOL N/A 03/31/2019   Procedure: COLONOSCOPY WITH PROPOFOL;  Surgeon: Toledo, Benay Pike, MD;  Location: [facility] ENDOSCOPY;  Service: Endoscopy;  Laterality: N/A;  . ESOPHAGOGASTRODUODENOSCOPY (EGD) WITH PROPOFOL N/A 08/16/2016   Procedure: ESOPHAGOGASTRODUODENOSCOPY (EGD) WITH PROPOFOL;  Surgeon: Manya Silvas, MD;  Location[facility] ENDOSCOPY;   Service: Endoscopy;  Laterality: N/A;  . ESOPHAGOGASTRODUODENOSCOPY (EGD) WITH PROPOFOL N/A 03/31/2019   Procedure: ESOPHAGOGASTRODUODENOSCOPY (EGD) WITH PROPOFOL;  Surgeon: Toledo, Benay Pike, MD;  Location: [facility] ENDOSCOPY;  Service: Endoscopy;  Laterality: N/A;  . TRANSURETHRAL RESECTION OF PROSTATE     Allergies:  Allergies  Allergen Reactions  . Ciprofloxacin Other (See Comments)    Joint aches   . Other Rash    Other reaction(s): Unknown Inflammation  . Penicillins Rash  . Sulfa Antibiotics Rash  . Tetracyclines & Related Other (See Comments)    Inflammation     Social History:  reports that he has never smoked. He has never used smokeless tobacco. He reports current alcohol use. He reports that he does not use drugs.  ROS: Please see flowsheet from today's date for complete review of systems.  Physical Exam: BP 111/75 (BP Location: Left Arm, Patient Position: Sitting, Cuff Size: Normal)   Pulse 78   Ht 5' 6.5" (1.689 m)   Wt 185 lb (83.9 kg)   BMI 29.41 kg/m    Constitutional:  Alert and oriented, No acute distress. Cardiovascular: No clubbing, cyanosis, or edema. Respiratory: Normal respiratory effort, no increased work of breathing. GI: Abdomen is soft, nontender, nondistended, no abdominal masses GU: Left CVA tenderness Lymph: No cervical or inguinal lymphadenopathy. Skin: No rashes, bruises or suspicious lesions. Neurologic: Grossly intact, no focal deficits, moving all 4 extremities. Psychiatric: Normal mood and affect.  Laboratory Data: Reviewed  Urinalysis today 0 RBCs, 0WBCs, no bacteria, nitrite negative  Pertinent Imaging: I have personally reviewed the CT dated 05/11/19. Left 47mm mid ureteral stone with upstream hydronephrosis, 930HU, 12cm SSD, clearly seen on scout CT. No other nephrolithiasis.   Assessment & Plan:   In summary, the patient is a 73 year old male with 1 week of left-sided flank pain secondary to a 9 mm left mid ureteral stone with  upstream hydronephrosis.  There are no other stones seen on CT.  We discussed various treatment options for urolithiasis including observation with or without medical expulsive therapy, shockwave lithotripsy (SWL), ureteroscopy and laser lithotripsy with stent placement, and percutaneous nephrolithotomy.  We discussed that management is based on stone size, location, density, patient co-morbidities, and patient preference.   Stones <80mm in size have a >80% spontaneous passage rate. Data surrounding the use of tamsulosin for medical expulsive therapy is controversial, but meta analyses suggests it is most efficacious for distal stones between 5-21mm in size. Possible side effects include dizziness/lightheadedness, and retrograde ejaculation.  SWL has a lower stone free rate in a single procedure, but also a lower complication rate compared to ureteroscopy and avoids a stent and associated stent related symptoms. Possible complications include renal hematoma, steinstrasse, and need for additional treatment.  Ureteroscopy with laser lithotripsy and stent placement has a higher stone free rate than SWL in a single procedure, however increased complication rate including possible infection, ureteral injury, bleeding, and stent related morbidity. Common stent related symptoms include dysuria, urgency/frequency, and flank pain.  After an extensive discussion of the risks and benefits of the above treatment options, the patient would like to proceed with left shockwave lithotripsy   Billey Co, MD  [place] [address] [place], [place] [address] [phone]

## 2019-05-13 NOTE — Patient Instructions (Signed)
Lithotripsy  Lithotripsy is a treatment that can sometimes help eliminate kidney stones and the pain that they cause. A form of lithotripsy, also known as extracorporeal shock wave lithotripsy, is a nonsurgical procedure that crushes a kidney stone with shock waves. These shock waves pass through your body and focus on the kidney stone. They cause the kidney stone to break up while it is still in the urinary tract. This makes it easier for the smaller pieces of stone to pass in the urine. Tell a health care provider about:  Any allergies you have.  All medicines you are taking, including vitamins, herbs, eye drops, creams, and over-the-counter medicines.  Any blood disorders you have.  Any surgeries you have had.  Any medical conditions you have.  Whether you are pregnant or may be pregnant.  Any problems you or family members have had with anesthetic medicines. What are the risks? Generally, this is a safe procedure. However, problems may occur, including:  Infection.  Bleeding of the kidney.  Bruising of the kidney or skin.  Scarring of the kidney, which can lead to: ? Increased blood pressure. ? Poor kidney function. ? Return (recurrence) of kidney stones.  Damage to other structures or organs, such as the liver, colon, spleen, or pancreas.  Blockage (obstruction) of the the tube that carries urine from the kidney to the bladder (ureter).  Failure of the kidney stone to break into pieces (fragments). What happens before the procedure? Staying hydrated Follow instructions from your health care provider about hydration, which may include:  Up to 2 hours before the procedure - you may continue to drink clear liquids, such as water, clear fruit juice, black coffee, and plain tea. Eating and drinking restrictions Follow instructions from your health care provider about eating and drinking, which may include:  8 hours before the procedure - stop eating heavy meals or foods  such as meat, fried foods, or fatty foods.  6 hours before the procedure - stop eating light meals or foods, such as toast or cereal.  6 hours before the procedure - stop drinking milk or drinks that contain milk.  2 hours before the procedure - stop drinking clear liquids. General instructions  Plan to have someone take you home from the hospital or clinic.  Ask your health care provider about: ? Changing or stopping your regular medicines. This is especially important if you are taking diabetes medicines or blood thinners. ? Taking medicines such as aspirin and ibuprofen. These medicines and other NSAIDs can thin your blood. Do not take these medicines for 7 days before your procedure if your health care provider instructs you not to.  You may have tests, such as: ? Blood tests. ? Urine tests. ? Imaging tests, such as a CT scan. What happens during the procedure?  To lower your risk of infection: ? Your health care team will wash or sanitize their hands. ? Your skin will be washed with soap.  An IV tube will be inserted into one of your veins. This tube will give you fluids and medicines.  You will be given one or more of the following: ? A medicine to help you relax (sedative). ? A medicine to make you fall asleep (general anesthetic).  A water-filled cushion may be placed behind your kidney or on your abdomen. In some cases you may be placed in a tub of lukewarm water.  Your body will be positioned in a way that makes it easy to target the kidney   stone.  A flexible tube with holes in it (stent) may be placed in the ureter. This will help keep urine flowing from the kidney if the fragments of the stone have been blocking the ureter.  An X-ray or ultrasound exam will be done to locate your stone.  Shock waves will be aimed at the stone. If you are awake, you may feel a tapping sensation as the shock waves pass through your body. The procedure may vary among health care  providers and hospitals. What happens after the procedure?  You may have an X-ray to see whether the procedure was able to break up the kidney stone and how much of the stone has passed. If large stone fragments remain after treatment, you may need to have a second procedure at a later time.  Your blood pressure, heart rate, breathing rate, and blood oxygen level will be monitored until the medicines you were given have worn off.  You may be given antibiotics or pain medicine as needed.  If a stent was placed in your ureter during surgery, it may stay in place for a few weeks.  You may need strain your urine to collect pieces of the kidney stone for testing.  You will need to drink plenty of water.  Do not drive for 24 hours if you were given a sedative. Summary  Lithotripsy is a treatment that can sometimes help eliminate kidney stones and the pain that they cause.  A form of lithotripsy, also known as extracorporeal shock wave lithotripsy, is a nonsurgical procedure that crushes a kidney stone with shock waves.  Generally, this is a safe procedure. However, problems may occur, including damage to the kidney or other organs, infection, or obstruction of the tube that carries urine from the kidney to the bladder (ureter).  When you go home, you will need to drink plenty of water. You may be asked to strain your urine to collect pieces of the kidney stone for testing. This information is not intended to replace advice given to you by your health care provider. Make sure you discuss any questions you have with your health care provider. Document Released: 08/25/2000 Document Revised: 12/09/2018 Document Reviewed: 07/19/2016 Else[employer] Patient Education  2020 [employer]   Lithotripsy, Care After This sheet gives you information about how to care for yourself after your procedure. Your health care provider may also give you more specific instructions. If you have problems or questions,  contact your health care provider. What can I expect after the procedure? After the procedure, it is common to have:  Some blood in your urine. This should only last for a few days.  Soreness in your back, sides, or upper abdomen for a few days.  Blotches or bruises on your back where the pressure wave entered the skin.  Pain, discomfort, or nausea when pieces (fragments) of the kidney stone move through the tube that carries urine from the kidney to the bladder (ureter). Stone fragments may pass soon after the procedure, but they may continue to pass for up to 4-8 weeks. ? If you have severe pain or nausea, contact your health care provider. This may be caused by a large stone that was not broken up, and this may mean that you need more treatment.  Some pain or discomfort during urination.  Some pain or discomfort in the lower abdomen or (in men) at the base of the penis. Follow these instructions at home: Medicines  Take over-the-counter and prescription medicines only   as told by your health care provider.  If you were prescribed an antibiotic medicine, take it as told by your health care provider. Do not stop taking the antibiotic even if you start to feel better.  Do not drive for 24 hours if you were given a medicine to help you relax (sedative).  Do not drive or use heavy machinery while taking prescription pain medicine. Eating and drinking      Drink enough water and fluids to keep your urine clear or pale yellow. This helps any remaining pieces of the stone to pass. It can also help prevent new stones from forming.  Eat plenty of fresh fruits and vegetables.  Follow instructions from your health care provider about eating and drinking restrictions. You may be instructed: ? To reduce how much salt (sodium) you eat or drink. Check ingredients and nutrition facts on packaged foods and beverages. ? To reduce how much meat you eat.  Eat the recommended amount of calcium for  your age and gender. Ask your health care provider how much calcium you should have. General instructions  Get plenty of rest.  Most people can resume normal activities 1-2 days after the procedure. Ask your health care provider what activities are safe for you.  Your health care provider may direct you to lie in a certain position (postural drainage) and tap firmly (percuss) over your kidney area to help stone fragments pass. Follow instructions as told by your health care provider.  If directed, strain all urine through the strainer that was provided by your health care provider. ? Keep all fragments for your health care provider to see. Any stones that are found may be sent to a medical lab for examination. The stone may be as small as a grain of salt.  Keep all follow-up visits as told by your health care provider. This is important. Contact a health care provider if:  You have pain that is severe or does not get better with medicine.  You have nausea that is severe or does not go away.  You have blood in your urine longer than your health care provider told you to expect.  You have more blood in your urine.  You have pain during urination that does not go away.  You urinate more frequently than usual and this does not go away.  You develop a rash or any other possible signs of an allergic reaction. Get help right away if:  You have severe pain in your back, sides, or upper abdomen.  You have severe pain while urinating.  Your urine is very dark red.  You have blood in your stool (feces).  You cannot pass any urine at all.  You feel a strong urge to urinate after emptying your bladder.  You have a fever or chills.  You develop shortness of breath, difficulty breathing, or chest pain.  You have severe nausea that leads to persistent vomiting.  You faint. Summary  After this procedure, it is common to have some pain, discomfort, or nausea when pieces (fragments)  of the kidney stone move through the tube that carries urine from the kidney to the bladder (ureter). If this pain or nausea is severe, however, you should contact your health care provider.  Most people can resume normal activities 1-2 days after the procedure. Ask your health care provider what activities are safe for you.  Drink enough water and fluids to keep your urine clear or pale yellow. This helps any remaining   pieces of the stone to pass, and it can help prevent new stones from forming.  If directed, strain your urine and keep all fragments for your health care provider to see. Fragments or stones may be as small as a grain of salt.  Get help right away if you have severe pain in your back, sides, or upper abdomen or have severe pain while urinating. This information is not intended to replace advice given to you by your health care provider. Make sure you discuss any questions you have with your health care provider. Document Released: 09/17/2007 Document Revised: 12/09/2018 Document Reviewed: 07/19/2016 Elsevier Patient Education  2020 [employer]  

## 2019-05-14 ENCOUNTER — Other Ambulatory Visit: Payer: Self-pay

## 2019-05-14 DIAGNOSIS — Z01818 Encounter for other preprocedural examination: Secondary | ICD-10-CM

## 2019-05-14 DIAGNOSIS — N201 Calculus of ureter: Secondary | ICD-10-CM

## 2019-05-15 ENCOUNTER — Encounter: Payer: Self-pay | Admitting: Emergency Medicine

## 2019-05-15 ENCOUNTER — Encounter
Admission: RE | Disposition: A | Discharge status: Home / Self Care | Payer: Self-pay | Source: Home / Self Care | Attending: Urology

## 2019-05-15 ENCOUNTER — Ambulatory Visit: Payer: PPO

## 2019-05-15 ENCOUNTER — Ambulatory Visit
Admission: RE | Admit: 2019-05-15 | Discharge: 2019-05-15 | Disposition: A | Discharge status: Home / Self Care | Payer: PPO | Attending: Urology | Admitting: Urology

## 2019-05-15 DIAGNOSIS — G4730 Sleep apnea, unspecified: Secondary | ICD-10-CM | POA: Insufficient documentation

## 2019-05-15 DIAGNOSIS — J45909 Unspecified asthma, uncomplicated: Secondary | ICD-10-CM | POA: Diagnosis not present

## 2019-05-15 DIAGNOSIS — N201 Calculus of ureter: Secondary | ICD-10-CM | POA: Diagnosis not present

## 2019-05-15 HISTORY — PX: EXTRACORPOREAL SHOCK WAVE LITHOTRIPSY: SHX1557

## 2019-05-15 LAB — URINE CULTURE: Culture: NO GROWTH

## 2019-05-15 SURGERY — LITHOTRIPSY, ESWL
Anesthesia: Moderate Sedation | Laterality: Left

## 2019-05-15 MED ORDER — SENNOSIDES-DOCUSATE SODIUM 8.6-50 MG PO TABS: 2.0000 | ORAL_TABLET | Freq: Two times a day (BID) | ORAL | 0 refills | Status: AC

## 2019-05-15 MED ORDER — DIAZEPAM 5 MG PO TABS
ORAL_TABLET | ORAL | Status: AC
  Filled 2019-05-15: qty 1

## 2019-05-15 MED ORDER — ONDANSETRON HCL 4 MG/2ML IJ SOLN
4.0000 mg | Freq: Once | INTRAMUSCULAR | Status: AC | PRN
  Administered 2019-05-15: 17:00:00 4 mg via INTRAVENOUS

## 2019-05-15 MED ORDER — DIPHENHYDRAMINE HCL 25 MG PO CAPS
25.0000 mg | ORAL_CAPSULE | ORAL | Status: AC
  Administered 2019-05-15: 17:00:00 25 mg via ORAL

## 2019-05-15 MED ORDER — ONDANSETRON HCL 4 MG/2ML IJ SOLN
INTRAMUSCULAR | Status: AC
  Administered 2019-05-15: 17:00:00 4 mg via INTRAVENOUS
  Filled 2019-05-15: qty 2

## 2019-05-15 MED ORDER — DIPHENHYDRAMINE HCL 25 MG PO CAPS
ORAL_CAPSULE | ORAL | Status: AC
  Filled 2019-05-15: qty 1

## 2019-05-15 MED ORDER — SODIUM CHLORIDE 0.9 % IV SOLN
INTRAVENOUS | Status: DC
  Administered 2019-05-15: 18:00:00 via INTRAVENOUS

## 2019-05-15 MED ORDER — CEFAZOLIN SODIUM-DEXTROSE 2-4 GM/100ML-% IV SOLN: 2.0000 g | INTRAVENOUS | Status: DC

## 2019-05-15 MED ORDER — DIAZEPAM 5 MG PO TABS: 10.0000 mg | ORAL_TABLET | ORAL | Status: DC

## 2019-05-15 MED ORDER — CEFAZOLIN SODIUM-DEXTROSE 2-4 GM/100ML-% IV SOLN
INTRAVENOUS | Status: AC
  Filled 2019-05-15: qty 100

## 2019-05-15 MED ORDER — DIAZEPAM 5 MG PO TABS
5.0000 mg | ORAL_TABLET | Freq: Once | ORAL | Status: AC
  Administered 2019-05-15: 5 mg via ORAL

## 2019-05-15 MED ORDER — POLYETHYLENE GLYCOL 3350 17 G PO PACK: 17.0000 g | PACK | Freq: Every day | ORAL | 0 refills | Status: AC

## 2019-05-15 MED ORDER — POLYETHYLENE GLYCOL 3350 17 G PO PACK: 17.0000 g | PACK | Freq: Every day | ORAL | 0 refills | Status: DC

## 2019-05-15 MED ORDER — ONDANSETRON HCL 2 MG/ML IV SOLN
4.0000 mg | Freq: Once | INTRAVENOUS | Status: DC | PRN
  Administered 2019-05-15: 4 mg via INTRAVENOUS

## 2019-05-15 NOTE — H&P (Signed)
UROLOGY H&P UPDATE  Agree with prior H&P dated 05/13/19. Symptomatic left 84mm mid ureteral stone, 930HU, 12cm SSD.  Cardiac: RRR Lungs: CTA bilaterally  Laterality: LEFT Procedure: SWL  Urine: culture 9/1 no growth  Informed consent obtained, we specifically discussed the risks of bleeding, infection, post-operative pain, need for additional procedures, steinstrasse, obstructive fragments.  Billey Co, MD 05/15/2019

## 2019-05-15 NOTE — Discharge Instructions (Signed)

## 2019-05-16 MED FILL — Ondansetron HCl Inj 4 MG/2ML (2 MG/ML): INTRAMUSCULAR | Qty: 2 | Status: AC

## 2019-05-18 ENCOUNTER — Other Ambulatory Visit: Payer: Self-pay

## 2019-05-18 ENCOUNTER — Emergency Department: Payer: PPO

## 2019-05-18 ENCOUNTER — Emergency Department
Admission: EM | Admit: 2019-05-18 | Discharge: 2019-05-18 | Disposition: A | Discharge status: Home / Self Care | Payer: PPO | Attending: Emergency Medicine | Admitting: Emergency Medicine

## 2019-05-18 DIAGNOSIS — Z79899 Other long term (current) drug therapy: Secondary | ICD-10-CM | POA: Insufficient documentation

## 2019-05-18 DIAGNOSIS — N200 Calculus of kidney: Secondary | ICD-10-CM | POA: Diagnosis not present

## 2019-05-18 DIAGNOSIS — R140 Abdominal distension (gaseous): Secondary | ICD-10-CM | POA: Diagnosis present

## 2019-05-18 DIAGNOSIS — Z7982 Long term (current) use of aspirin: Secondary | ICD-10-CM | POA: Insufficient documentation

## 2019-05-18 DIAGNOSIS — I10 Essential (primary) hypertension: Secondary | ICD-10-CM | POA: Insufficient documentation

## 2019-05-18 DIAGNOSIS — K567 Ileus, unspecified: Secondary | ICD-10-CM | POA: Diagnosis not present

## 2019-05-18 DIAGNOSIS — N132 Hydronephrosis with renal and ureteral calculous obstruction: Secondary | ICD-10-CM | POA: Diagnosis not present

## 2019-05-18 LAB — COMPREHENSIVE METABOLIC PANEL
ALT: 34 U/L (ref 0–44)
AST: 32 U/L (ref 15–41)
Albumin: 4.1 g/dL (ref 3.5–5.0)
Alkaline Phosphatase: 70 U/L (ref 38–126)
Anion gap: 12 (ref 5–15)
BUN: 19 mg/dL (ref 8–23)
CO2: 26 mmol/L (ref 22–32)
Calcium: 9.8 mg/dL (ref 8.9–10.3)
Chloride: 98 mmol/L (ref 98–111)
Creatinine, Ser: 1.14 mg/dL (ref 0.61–1.24)
GFR calc Af Amer: >60 mL/min (ref >60)
GFR calc non Af Amer: >60 mL/min (ref >60)
Glucose, Bld: 120 mg/dL — ABNORMAL HIGH (ref 70–99)
Potassium: 4.2 mmol/L (ref 3.5–5.1)
Sodium: 136 mmol/L (ref 135–145)
Total Bilirubin: 0.7 mg/dL (ref 0.3–1.2)
Total Protein: 7.9 g/dL (ref 6.5–8.1)

## 2019-05-18 LAB — URINALYSIS, COMPLETE (UACMP) WITH MICROSCOPIC
Bilirubin Urine: NEGATIVE
Glucose, UA: NEGATIVE mg/dL
Ketones, ur: NEGATIVE mg/dL
Nitrite: NEGATIVE
Protein, ur: 30 mg/dL — AB
RBC / HPF: >50 RBC/hpf — ABNORMAL HIGH (ref 0–5)
Specific Gravity, Urine: 1.016 (ref 1.005–1.030)
Squamous Epithelial / HPF: NONE SEEN (ref 0–5)
WBC, UA: >50 WBC/hpf — ABNORMAL HIGH (ref 0–5)
pH: 5 (ref 5.0–8.0)

## 2019-05-18 LAB — CBC
HCT: 45.6 % (ref 39.0–52.0)
Hemoglobin: 15.5 g/dL (ref 13.0–17.0)
MCH: 31.1 pg (ref 26.0–34.0)
MCHC: 34 g/dL (ref 30.0–36.0)
MCV: 91.6 fL (ref 80.0–100.0)
Platelets: 203 10*3/uL (ref 150–400)
RBC: 4.98 MIL/uL (ref 4.22–5.81)
RDW: 11.9 % (ref 11.5–15.5)
WBC: 9.3 10*3/uL (ref 4.0–10.5)
nRBC: 0 % (ref 0.0–0.2)

## 2019-05-18 LAB — LIPASE, BLOOD: Lipase: 29 U/L (ref 11–51)

## 2019-05-18 MED ORDER — SODIUM CHLORIDE 0.9% FLUSH: 3.0000 mL | Freq: Once | INTRAVENOUS | Status: DC

## 2019-05-18 MED ORDER — ONDANSETRON HCL 4 MG/2ML IJ SOLN
4.0000 mg | Freq: Once | INTRAMUSCULAR | Status: AC
  Administered 2019-05-18: 4 mg via INTRAVENOUS
  Filled 2019-05-18: qty 2

## 2019-05-18 MED ORDER — IOHEXOL 9 MG/ML PO SOLN
500.0000 mL | Freq: Two times a day (BID) | ORAL | Status: DC | PRN
  Administered 2019-05-18 (×2): 500 mL via ORAL
  Filled 2019-05-18 (×2): qty 500

## 2019-05-18 MED ORDER — IOHEXOL 300 MG/ML  SOLN
100.0000 mL | Freq: Once | INTRAMUSCULAR | Status: AC | PRN
  Administered 2019-05-18: 100 mL via INTRAVENOUS

## 2019-05-18 MED ORDER — NALOXEGOL OXALATE 12.5 MG PO TABS: 12.5000 mg | ORAL_TABLET | Freq: Every day | ORAL | 0 refills | Status: AC

## 2019-05-18 NOTE — ED Triage Notes (Signed)
Pt states he was on pain meds for kidney stone and had lithotripsy last week but has not been able to have a BM in a week. States he has been taking stool softeners with no relief. Pt is in NAD at present.

## 2019-05-18 NOTE — Discharge Instructions (Addendum)
Your work-up was reassuring except your CT scan does show an ileus.  You should drink liquids and take this medication to help with the opioid Constipation to see if this helps with the constipation. Return for worse abdominal pain or vomiting.  For the CT scan showing the hydronephrosis and the kidney stones.  You should return return for the fevers. Call your kidney doc on Tuesday to discuss this.    LEFT hydronephrosis and hydroureter secondary to at least 3 proximal to mid LEFT reader O calculi largest 3 x 7 mm, associated with mild delay in LEFT nephrogram.   Fatty infiltration of liver.   Extension of the LEFT anterolateral aspect of the urinary bladder into a LEFT inguinal hernia.   Diffusely dilated small bowel loops and minimally distended fluid-filled colon throughout its length, question ileus, with no point of obstruction identified.   Sigmoid diverticulosis.   Small hiatal hernia.

## 2019-05-18 NOTE — ED Provider Notes (Addendum)
[facility] Emergency Department Provider Note  ____________________________________________   First MD Initiated Contact with Patient 05/18/19 1328     (approximate)  I have reviewed the triage vital signs and the nursing notes.   HISTORY  Chief Complaint Constipation    HPI Jimmy Obrien is a 73 y.o. male with diverticulum, A. fib, hypertension who recently had a left side obstructing kidney stone 38mm stone.  Patient was recently on opioids for his kidney stone.  He stopped taking those on Tuesday but is not had a bowel movement for 1 week.  He continues to feel abdominal bloating.  He has not had any vomiting.  He did take some mag citrate and had some liquid stool but no formed bowel movements.  He had an x-ray done on 9/3 that was concerning for small bowel obstruction.  Given patient continued to have abdominal pain patient presented to the emergency department.  Pain is moderate, constant, nothing makes it better, nothing makes it worse.          Past Medical History:  Diagnosis Date  . BPH (benign prostatic hyperplasia)   . Diverticulitis   . Diverticulosis   . GERD (gastroesophageal reflux disease)   . Hx of atrial fibrillation without current medication   . Hypertension   . Hyperthyroidism    Multinodular Goiter  . Sleep apnea   . Thrombocytopenia [facility])     Patient Active Problem List   Diagnosis Date Noted  . Abnormal EKG 05/13/2019  . Atrial fibrillation ([place]) 05/13/2019  . BPH (benign prostatic hyperplasia) 05/13/2019  . GERD (gastroesophageal reflux disease) 05/13/2019  . Hypertension 05/13/2019  . Thrombocytopenia ([place]) 05/13/2019  . History of hyperthyroidism 04/11/2018  . Prediabetes 04/11/2018  . Sleep apnea 11/01/2016  . Flatulence/gas pain/belching 12/10/2015  . Vitamin D deficiency, unspecified 10/11/2015  . 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 CHOLECYSTECTOMY     . COLON RESECTION    . COLONOSCOPY WITH PROPOFOL N/A 03/31/2019   Procedure: COLONOSCOPY WITH PROPOFOL;  Surgeon: Toledo, Benay Pike, MD;  Location: [facility] ENDOSCOPY;  Service: Endoscopy;  Laterality: N/A;  . ESOPHAGOGASTRODUODENOSCOPY (EGD) WITH PROPOFOL N/A 08/16/2016   Procedure: ESOPHAGOGASTRODUODENOSCOPY (EGD) WITH PROPOFOL;  Surgeon: Manya Silvas, MD;  Location: [facility] ENDOSCOPY;  Service: Endoscopy;  Laterality: N/A;  . ESOPHAGOGASTRODUODENOSCOPY (EGD) WITH PROPOFOL N/A 03/31/2019   Procedure: ESOPHAGOGASTRODUODENOSCOPY (EGD) WITH PROPOFOL;  Surgeon: Toledo, Benay Pike, MD;  Location: [facility] ENDOSCOPY;  Service: Endoscopy;  Laterality: N/A;  . EXTRACORPOREAL SHOCK WAVE LITHOTRIPSY Left 05/15/2019   Procedure: EXTRACORPOREAL SHOCK WAVE LITHOTRIPSY (ESWL);  Surgeon: Billey Co, MD;  Location: [facility] ORS;  Service: Urology;  Laterality: Left;  . TRANSURETHRAL RESECTION OF PROSTATE      Prior to Admission medications   Medication Sig Start Date End Date Taking? Authorizing Provider  albuterol (PROVENTIL HFA;VENTOLIN HFA) 108 (90 Base) MCG/ACT inhaler Inhale 2 puffs into the lungs every 6 (six) hours as needed for wheezing or shortness of breath.    [provider]  amphetamine-dextroamphetamine (ADDERALL) 10 MG tablet Take 10 mg by mouth 2 (two) times daily with a meal.    [provider]  aspirin EC 81 MG tablet Take 81 mg by mouth daily.    [provider]  atenolol (TENORMIN) 25 MG tablet Take 12.5 mg by mouth daily.    [provider]  azelastine (ASTELIN) 0.1 % nasal spray Place 1 spray into both nostrils 2 (two)  times daily. Use in each nostril as directed    [provider]  budesonide (RHINOCORT AQUA) 32 MCG/ACT nasal spray Place 1 spray into both nostrils daily.    [provider]  Camphor-Menthol-Methyl Sal (FLEXALL ULTRA PLUS EX) Apply 1 application topically as needed.    [provider]  Cholecalciferol 10000 units CAPS  Take by mouth 1 day or 1 dose.    [provider]  citalopram (CELEXA) 10 MG tablet  04/02/19   [provider]  cyanocobalamin 1000 MCG tablet Take 1,000 mcg by mouth daily.    [provider]  diazepam (VALIUM) 5 MG tablet Take 5 mg by mouth 2 (two) times daily.    [provider]  famotidine (PEPCID) 40 MG tablet Take 40 mg by mouth daily.    [provider]  FLOVENT HFA 110 MCG/ACT inhaler  04/17/19   [provider]  fluticasone (FLOVENT DISKUS) 50 MCG/BLIST diskus inhaler Inhale 1 puff into the lungs 2 (two) times daily.    [provider]  levocetirizine (XYZAL) 5 MG tablet Take 5 mg by mouth every evening.    [provider]  Melatonin 5 MG TABS Take 1 tablet by mouth at bedtime.    [provider]  meloxicam (MOBIC) 7.5 MG tablet Take 7.5 mg by mouth daily.    [provider]  methimazole (TAPAZOLE) 5 MG tablet Take 5 mg by mouth daily.    [provider]  montelukast (SINGULAIR) 10 MG tablet  04/17/19   [provider]  Multiple Vitamin (MULTIVITAMIN) tablet Take 1 tablet by mouth daily.    [provider]  oxyCODONE-acetaminophen (PERCOCET/ROXICET) 5-325 MG tablet Take 1 tablet by mouth every 6 (six) hours as needed for severe pain. 05/11/19   Cuthriell, Charline Bills, PA-C  pantoprazole (PROTONIX) 40 MG tablet Take 40 mg by mouth daily.    [provider]  polyethylene glycol (MIRALAX) 17 g packet Take 17 g by mouth daily. 05/15/19   Billey Co, MD  polyethylene glycol (MIRALAX) 17 g packet Take 17 g by mouth daily. 05/15/19   Billey Co, MD  Polyethylene Glycol 3350 (PEG 3350) 17 GM/SCOOP POWD Take by mouth.    [provider]  potassium gluconate 595 (99 K) MG TABS tablet Take 595 mg by mouth daily.    [provider]  senna-docusate (SENOKOT-S) 8.6-50 MG tablet Take 1 tablet by mouth daily.    [provider]  senna-docusate  (SENOKOT-S) 8.6-50 MG tablet Take 2 tablets by mouth 2 (two) times daily. 05/15/19   Billey Co, MD  silodosin (RAPAFLO) 8 MG CAPS capsule Take 8 mg by mouth daily with breakfast.    [provider]  Simethicone 180 MG CAPS Take 125 mg by mouth every 6 (six) hours as needed for flatulence.    [provider]  sucralfate (CARAFATE) 1 g tablet Take 1 g by mouth 3 (three) times daily with meals as needed.    [provider]    Allergies Ciprofloxacin, Other, Penicillins, Sulfa antibiotics, and Tetracyclines & related  No family history on file.  Social History Social History   Tobacco Use  . Smoking status: Never Smoker  . Smokeless tobacco: Never Used  Substance Use Topics  . Alcohol use: Yes    Comment: socially  . Drug use: No      Review of Systems Constitutional: No fever/chills Eyes: No visual changes. ENT: No sore throat. Cardiovascular: Denies chest pain.  Respiratory: Denies shortness of breath. Gastrointestinal: + abd pain.  No nausea, no vomiting.  No diarrhea.  +constipation Genitourinary: Negative for dysuria. Musculoskeletal: Negative for back pain. Skin: Negative for rash. Neurological: Negative for headaches, focal weakness or numbness. All other ROS negative ____________________________________________   PHYSICAL EXAM:  VITAL SIGNS: ED Triage Vitals  Enc Vitals Group     BP 05/18/19 1226 119/79     Pulse Rate 05/18/19 1226 89     Resp 05/18/19 1226 18     Temp 05/18/19 1226 97.8 F (36.6 C)     Temp Source 05/18/19 1226 Oral     SpO2 05/18/19 1226 100 %     Weight 05/18/19 1219 185 lb (83.9 kg)     Height 05/18/19 1219 5\' 6"  (1.676 m)     Head Circumference --      Peak Flow --      Pain Score --      Pain Loc --      Pain Edu? --      Excl. in [place]? --     Constitutional: Alert and oriented. Well appearing and in no acute distress. Eyes: Conjunctivae are normal. EOMI. Head: Atraumatic. Nose: No  congestion/rhinnorhea. Mouth/Throat: Mucous membranes are moist.   Neck: No stridor. Trachea Midline. FROM Cardiovascular: Normal rate, regular rhythm. Grossly normal heart sounds.  Good peripheral circulation. Respiratory: Normal respiratory effort.  No retractions. Lungs CTAB. Gastrointestinal: Soft and nontender. Slightly distended. No abdominal bruits.  Musculoskeletal: No lower extremity tenderness nor edema.  No joint effusions. Neurologic:  Normal speech and language. No gross focal neurologic deficits are appreciated.  Skin:  Skin is warm, dry and intact. No rash noted. Psychiatric: Mood and affect are normal. Speech and behavior are normal. GU: Deferred   ____________________________________________   LABS (all labs ordered are listed, but only abnormal results are displayed)  Labs Reviewed  COMPREHENSIVE METABOLIC PANEL - Abnormal; Notable for the following components:      Result Value   Glucose, Bld 120 (*)    All other components within normal limits  URINALYSIS, COMPLETE (UACMP) WITH MICROSCOPIC - Abnormal; Notable for the following components:   Color, Urine YELLOW (*)    APPearance CLOUDY (*)    Hgb urine dipstick LARGE (*)    Protein, ur 30 (*)    Leukocytes,Ua SMALL (*)    RBC / HPF >50 (*)    WBC, UA >50 (*)    Bacteria, UA RARE (*)    All other components within normal limits  LIPASE, BLOOD  CBC   ____________________________________________     RADIOLOGY   Official radiology report(s): Ct Abdomen Pelvis W Contrast  Result Date: 05/18/2019 CLINICAL DATA:  Abdominal distension question bowel obstruction, had lithotripsy last week, on pain medications, no bowel movement for a week, no relief with stool softeners EXAM: CT ABDOMEN AND PELVIS WITH CONTRAST TECHNIQUE: Multidetector CT imaging of the abdomen and pelvis was performed using the standard protocol following bolus administration of intravenous contrast. Sagittal and coronal MPR images  reconstructed from axial data set. CONTRAST:  177mL OMNIPAQUE IOHEXOL 300 MG/ML SOLN IV. Dilute oral contrast. COMPARISON:  05/11/2019 FINDINGS: Lower chest: Minimal bibasilar atelectasis Hepatobiliary: Fatty infiltration of liver. Liver otherwise unremarkable. Gallbladder surgically absent. No biliary dilatation. Pancreas: Normal appearance Spleen: Normal appearance Adrenals/Urinary Tract: Adrenal glands normal appearance. Small RIGHT renal cyst without additional mass or hydronephrosis. Multiple LEFT renal cysts. Delay in LEFT nephrogram with LEFT hydronephrosis and hydroureter. Multiple proximal to mid LEFT ureteral  calculi identified largest 3 x 7 mm. Remaining ureters decompressed. Contains only a small amount of urine. Small portion of the LEFT lateral aspect of the bladder extends into a LEFT inguinal hernia. Stomach/Bowel: Fluid throughout mildly distended colon. Sigmoid diverticulosis without evidence of diverticulitis. Prior ileocecal resection with patent anastomosis. Diffusely dilated small bowel loops throughout abdomen without discrete point of obstruction, question ileus. Small hiatal hernia. Remainder of stomach unremarkable. No bowel wall thickening. Vascular/Lymphatic: Atherosclerotic calcifications aorta without aneurysm. No adenopathy. Reproductive: Central prostatic calcifications. Prostate gland and seminal vesicles otherwise unremarkable. Other: No free air or free fluid.  No focal inflammatory process. Musculoskeletal: Osseous demineralization without focal abnormality. IMPRESSION: LEFT hydronephrosis and hydroureter secondary to at least 3 proximal to mid LEFT reader O calculi largest 3 x 7 mm, associated with mild delay in LEFT nephrogram. Fatty infiltration of liver. Extension of the LEFT anterolateral aspect of the urinary bladder into a LEFT inguinal hernia. Diffusely dilated small bowel loops and minimally distended fluid-filled colon throughout its length, question ileus, with no point  of obstruction identified. Sigmoid diverticulosis. Small hiatal hernia. Electronically Signed   By: Lavonia Dana M.D.   On: 05/18/2019 15:48    ____________________________________________   PROCEDURES  Procedure(s) performed (including Critical Care):  Procedures   ____________________________________________   INITIAL IMPRESSION / ASSESSMENT AND PLAN / ED COURSE  Jimmy Obrien was evaluated in Emergency Department on 05/18/2019 for the symptoms described in the history of present illness. He was evaluated in the context of the global COVID-19 pandemic, which necessitated consideration that the patient might be at risk for infection with the SARS-CoV-2 virus that causes COVID-19. Institutional protocols and algorithms that pertain to the evaluation of patients at risk for COVID-19 are in a state of rapid change based on information released by regulatory bodies including the CDC and federal and state organizations. These policies and algorithms were followed during the patient's care in the ED.    She presents with abdominal distention after being on a course of opioids.  Given the abnormal x-ray will get CT scan to ensure that there is no SBO given that he has had prior hemicolectomy although his exam does not seem consistent with SBO given abdomen is slightly distended but he is soft and nontender without vomiting.  Low suspicion for ischemic bowel, appendicitis, diverticulitis.  Patient not having vomiting and has been tolerating eating. Pt able to urinate.  Patient urine looks consistent with possible UTI.  This was ordered by triage.  Patient has no symptoms and patient did just have a procedure for kidney stone this is most likely normal being postop.  Patient has no white count to suggest infection.  Patient's labs are otherwise normal.  CT scan is concerning for ileus without any evidence of SBO. Pt also with 24mm stone x3 with hydro. Given recent post op from lithotripsy will d/w with  urology.  D/w Dr. Harrie Foreman for d/c home. No antibiotics at this time, the >50 whites is common after lithotripsy.    D.w pt return precautions including fevers, vomiting, abd pain.  Pt understands that ileus could become SBO and too careful monitor symptoms.  Will trial some anti-opioid pills for constipation.  Instructed to not take his other stool softens and to try this for three days.  If no improvement then he can discontinue and return to old regime. Recommended bowel rest with liquid diet and close monitoring and low threshold to come back to ED for vomiting, abd pain.  He understands that this could become SBO especially with his h/o bowel surgery.  Pt felt comfortable with this plan.  His abd remains slightly distended but soft and non tender. No signs of dehydration or vomiting and able to tolerate PO therefore felt comfortable with going home and returning to ED if symptoms change or worsen.   Pt will call urologist on Tuesday to update him with this CT scan.  I discussed the provisional nature of ED diagnosis, the treatment so far, the ongoing plan of care, follow up appointments and return precautions with the patient and any family or support people present. They expressed understanding and agreed with the plan, discharged home.     ____________________________________________   FINAL CLINICAL IMPRESSION(S) / ED DIAGNOSES   Final diagnoses:  Ileus ([place])  Kidney stone      MEDICATIONS GIVEN DURING THIS VISIT:  Medications  sodium chloride flush (NS) 0.9 % injection 3 mL (has no administration in time range)  iohexol (OMNIPAQUE) 9 MG/ML oral solution 500 mL (500 mLs Oral Contrast Given 05/18/19 1525)  ondansetron (ZOFRAN) injection 4 mg (4 mg Intravenous Given 05/18/19 1419)  iohexol (OMNIPAQUE) 300 MG/ML solution 100 mL (100 mLs Intravenous Contrast Given 05/18/19 1523)     ED Discharge Orders         Ordered    naloxegol oxalate (MOVANTIK) 12.5 MG TABS tablet   Daily     05/18/19 1649           Note:  This document was prepared using Dragon voice recognition software and may include unintentional dictation errors.   Vanessa Sherrard, MD 05/18/19 Marko Stai    Vanessa [place], MD 05/18/19 2004

## 2019-05-19 NOTE — ED Notes (Signed)
Called in pt prescription for movantik 12.5mg  daily for 5 days to [facility] in [place] per pt request, original prescription sent to [facility] that is not open today.

## 2019-05-20 ENCOUNTER — Telehealth: Payer: Self-pay | Admitting: Urology

## 2019-05-20 NOTE — Telephone Encounter (Signed)
Called pt informed him of the message below per Dr.Sninsky, pt gave verbal understanding.

## 2019-05-20 NOTE — Telephone Encounter (Signed)
Looks like his stone broke up well from lithotripsy based on the CT results, those small stone pieces should pass through. Needs to continue stool softeners and would recommend daily suppository to help with his severe constipation. Keep follow up as scheduled.  Nickolas Madrid, MD 05/20/2019

## 2019-05-20 NOTE — Telephone Encounter (Signed)
Pt was seen in the ER on Saturday and was diagnosed with an Ilieus, He just wanted to let you know about this diagnosis, to see if you wanted to see him before his upcoming appt on 06/03/2019.

## 2019-05-23 DIAGNOSIS — K2950 Unspecified chronic gastritis without bleeding: Secondary | ICD-10-CM | POA: Diagnosis not present

## 2019-05-23 DIAGNOSIS — D696 Thrombocytopenia, unspecified: Secondary | ICD-10-CM | POA: Diagnosis not present

## 2019-05-23 DIAGNOSIS — K760 Fatty (change of) liver, not elsewhere classified: Secondary | ICD-10-CM | POA: Diagnosis not present

## 2019-05-23 DIAGNOSIS — K219 Gastro-esophageal reflux disease without esophagitis: Secondary | ICD-10-CM | POA: Diagnosis not present

## 2019-05-23 DIAGNOSIS — K567 Ileus, unspecified: Secondary | ICD-10-CM | POA: Diagnosis not present

## 2019-05-23 DIAGNOSIS — K5900 Constipation, unspecified: Secondary | ICD-10-CM | POA: Diagnosis not present

## 2019-05-23 DIAGNOSIS — K5909 Other constipation: Secondary | ICD-10-CM | POA: Diagnosis not present

## 2019-05-23 DIAGNOSIS — R1013 Epigastric pain: Secondary | ICD-10-CM | POA: Diagnosis not present

## 2019-06-03 ENCOUNTER — Ambulatory Visit
Admission: RE | Admit: 2019-06-03 | Discharge: 2019-06-03 | Disposition: A | Discharge status: Home / Self Care | Payer: PPO | Source: Ambulatory Visit | Attending: Urology | Admitting: Urology

## 2019-06-03 ENCOUNTER — Encounter: Payer: Self-pay | Admitting: Urology

## 2019-06-03 ENCOUNTER — Other Ambulatory Visit: Payer: Self-pay

## 2019-06-03 ENCOUNTER — Ambulatory Visit
Admission: RE | Admit: 2019-06-03 | Discharge: 2019-06-03 | Disposition: A | Discharge status: Home / Self Care | Payer: PPO | Attending: Urology | Admitting: Urology

## 2019-06-03 ENCOUNTER — Ambulatory Visit (INDEPENDENT_AMBULATORY_CARE_PROVIDER_SITE_OTHER): Payer: Self-pay | Admitting: Urology

## 2019-06-03 ENCOUNTER — Other Ambulatory Visit
Admission: RE | Admit: 2019-06-03 | Discharge: 2019-06-03 | Disposition: A | Discharge status: Home / Self Care | Payer: PPO | Source: Home / Self Care | Attending: Urology | Admitting: Urology

## 2019-06-03 VITALS — BP 97/68 | HR 85 | Ht 66.5 in | Wt 185.0 lb

## 2019-06-03 DIAGNOSIS — N201 Calculus of ureter: Secondary | ICD-10-CM

## 2019-06-03 DIAGNOSIS — N200 Calculus of kidney: Secondary | ICD-10-CM

## 2019-06-03 DIAGNOSIS — G4733 Obstructive sleep apnea (adult) (pediatric): Secondary | ICD-10-CM | POA: Diagnosis not present

## 2019-06-03 DIAGNOSIS — R350 Frequency of micturition: Secondary | ICD-10-CM

## 2019-06-03 DIAGNOSIS — F909 Attention-deficit hyperactivity disorder, unspecified type: Secondary | ICD-10-CM | POA: Diagnosis not present

## 2019-06-03 DIAGNOSIS — Z01818 Encounter for other preprocedural examination: Secondary | ICD-10-CM

## 2019-06-03 DIAGNOSIS — R0600 Dyspnea, unspecified: Secondary | ICD-10-CM | POA: Diagnosis not present

## 2019-06-03 LAB — URINALYSIS, DIPSTICK ONLY
Bilirubin Urine: NEGATIVE
Glucose, UA: NEGATIVE mg/dL
Hgb urine dipstick: NEGATIVE
Ketones, ur: NEGATIVE mg/dL
Leukocytes,Ua: NEGATIVE
Nitrite: NEGATIVE
Protein, ur: NEGATIVE mg/dL
Specific Gravity, Urine: 1.015 (ref 1.005–1.030)
pH: 6.5 (ref 5.0–8.0)

## 2019-06-03 NOTE — Addendum Note (Signed)
Addended by: Milbert Coulter on: 06/03/2019 09:42 AM   Modules accepted: Orders

## 2019-06-03 NOTE — Progress Notes (Signed)
   06/03/2019 10:38 AM   Jimmy Obrien [birth year] [id number]  Reason for visit: Follow up left shockwave lithotripsy  HPI: I saw Jimmy Obrien back in urology clinic today for follow-up.  He is a 73 year old male that underwent left shockwave lithotripsy on 05/15/2019 for an 8 mm left mid ureteral stone.  He was seen in the emergency department on 05/18/2019 with constipation and inability to have a bowel movement.  CT showed severe constipation and ileus, and there were some small stone fragments in the left ureter.  He reports his constipation resolved with medications and his pain is completely gone.  He is having regular bowel movements again.  He passed a number of small stone fragments which he brought with him today.  He is not having any hematuria, flank pain, dysuria, fevers, or chills.  I personally reviewed his KUB today, no residual fragments appreciated.   Physical Exam: BP 97/68 (BP Location: Left Arm, Patient Position: Sitting, Cuff Size: Normal)   Pulse 85   Ht 5' 6.5" (1.689 m)   Wt 185 lb (83.9 kg)   BMI 29.41 kg/m    We discussed general stone prevention strategies including adequate hydration with goal of producing 2.5 L of urine daily, increasing citric acid intake, increasing calcium intake during high oxalate meals, minimizing animal protein, and decreasing salt intake. Information about dietary recommendations given today.   RTC as needed  A total of 15 minutes were spent face-to-face with the patient, greater than 50% was spent in patient education, counseling, and coordination of care regarding KUB results and stone prevention.  Billey Co, [facility] [address] [place], [place] [address] [phone]

## 2019-06-03 NOTE — Patient Instructions (Signed)
Dietary Guidelines to Help Prevent Kidney Stones Kidney stones are deposits of minerals and salts that form inside your kidneys. Your risk of developing kidney stones may be greater depending on your diet, your lifestyle, the medicines you take, and whether you have certain medical conditions. Most people can reduce their chances of developing kidney stones by following the instructions below. Depending on your overall health and the type of kidney stones you tend to develop, your dietitian may give you more specific instructions. What are tips for following this plan? Reading food labels  Choose foods with "no salt added" or "low-salt" labels. Limit your sodium intake to less than 1500 mg per day.  Choose foods with calcium for each meal and snack. Try to eat about 300 mg of calcium at each meal. Foods that contain 200-500 mg of calcium per serving include: ? 8 oz (237 ml) of milk, fortified nondairy milk, and fortified fruit juice. ? 8 oz (237 ml) of kefir, yogurt, and soy yogurt. ? 4 oz (118 ml) of tofu. ? 1 oz of cheese. ? 1 cup (300 g) of dried figs. ? 1 cup (91 g) of cooked broccoli. ? 1-3 oz can of sardines or mackerel.  Most people need 1000 to 1500 mg of calcium each day. Talk to your dietitian about how much calcium is recommended for you. Shopping  Buy plenty of fresh fruits and vegetables. Most people do not need to avoid fruits and vegetables, even if they contain nutrients that may contribute to kidney stones.  When shopping for convenience foods, choose: ? Whole pieces of fruit. ? Premade salads with dressing on the side. ? Low-fat fruit and yogurt smoothies.  Avoid buying frozen meals or prepared deli foods.  Look for foods with live cultures, such as yogurt and kefir. Cooking  Do not add salt to food when cooking. Place a salt shaker on the table and allow each person to add his or her own salt to taste.  Use vegetable protein, such as beans, textured vegetable  protein (TVP), or tofu instead of meat in pasta, casseroles, and soups. Meal planning   Eat less salt, if told by your dietitian. To do this: ? Avoid eating processed or premade food. ? Avoid eating fast food.  Eat less animal protein, including cheese, meat, poultry, or fish, if told by your dietitian. To do this: ? Limit the number of times you have meat, poultry, fish, or cheese each week. Eat a diet free of meat at least 2 days a week. ? Eat only one serving each day of meat, poultry, fish, or seafood. ? When you prepare animal protein, cut pieces into small portion sizes. For most meat and fish, one serving is about the size of one deck of cards.  Eat at least 5 servings of fresh fruits and vegetables each day. To do this: ? Keep fruits and vegetables on hand for snacks. ? Eat 1 piece of fruit or a handful of berries with breakfast. ? Have a salad and fruit at lunch. ? Have two kinds of vegetables at dinner.  Limit foods that are high in a substance called oxalate. These include: ? Spinach. ? Rhubarb. ? Beets. ? Potato chips and french fries. ? Nuts.  If you regularly take a diuretic medicine, make sure to eat at least 1-2 fruits or vegetables high in potassium each day. These include: ? Avocado. ? Banana. ? Orange, prune, carrot, or tomato juice. ? Baked potato. ? Cabbage. ? Beans and split   peas. General instructions   Drink enough fluid to keep your urine clear or pale yellow. This is the most important thing you can do.  Talk to your health care provider and dietitian about taking daily supplements. Depending on your health and the cause of your kidney stones, you may be advised: ? Not to take supplements with vitamin C. ? To take a calcium supplement. ? To take a daily probiotic supplement. ? To take other supplements such as magnesium, fish oil, or vitamin B6.  Take all medicines and supplements as told by your health care provider.  Limit alcohol intake to no  more than 1 drink a day for nonpregnant women and 2 drinks a day for men. One drink equals 12 oz of beer, 5 oz of wine, or 1 oz of hard liquor.  Lose weight if told by your health care provider. Work with your dietitian to find strategies and an eating plan that works best for you. What foods are not recommended? Limit your intake of the following foods, or as told by your dietitian. Talk to your dietitian about specific foods you should avoid based on the type of kidney stones and your overall health. Grains Breads. Bagels. Rolls. Baked goods. Salted crackers. Cereal. Pasta. Vegetables Spinach. Rhubarb. Beets. Canned vegetables. Pickles. Olives. Meats and other protein foods Nuts. Nut butters. Large portions of meat, poultry, or fish. Salted or cured meats. Deli meats. Hot dogs. Sausages. Dairy Cheese. Beverages Regular soft drinks. Regular vegetable juice. Seasonings and other foods Seasoning blends with salt. Salad dressings. Canned soups. Soy sauce. Ketchup. Barbecue sauce. Canned pasta sauce. Casseroles. Pizza. Lasagna. Frozen meals. Potato chips. French fries. Summary  You can reduce your risk of kidney stones by making changes to your diet.  The most important thing you can do is drink enough fluid. You should drink enough fluid to keep your urine clear or pale yellow.  Ask your health care provider or dietitian how much protein from animal sources you should eat each day, and also how much salt and calcium you should have each day. This information is not intended to replace advice given to you by your health care provider. Make sure you discuss any questions you have with your health care provider. Document Released: 12/23/2010 Document Revised: 12/18/2018 Document Reviewed: 08/08/2016 Elsevier Patient Education  2020 [employer]  

## 2019-06-04 LAB — URINE CULTURE: Culture: NO GROWTH

## 2019-06-20 DIAGNOSIS — J019 Acute sinusitis, unspecified: Secondary | ICD-10-CM | POA: Diagnosis not present

## 2019-06-20 DIAGNOSIS — J302 Other seasonal allergic rhinitis: Secondary | ICD-10-CM | POA: Diagnosis not present

## 2019-07-02 DIAGNOSIS — Z125 Encounter for screening for malignant neoplasm of prostate: Secondary | ICD-10-CM | POA: Diagnosis not present

## 2019-07-02 DIAGNOSIS — Z8639 Personal history of other endocrine, nutritional and metabolic disease: Secondary | ICD-10-CM | POA: Diagnosis not present

## 2019-07-02 DIAGNOSIS — R7303 Prediabetes: Secondary | ICD-10-CM | POA: Diagnosis not present

## 2019-07-02 DIAGNOSIS — G4733 Obstructive sleep apnea (adult) (pediatric): Secondary | ICD-10-CM | POA: Diagnosis not present

## 2019-07-02 DIAGNOSIS — I480 Paroxysmal atrial fibrillation: Secondary | ICD-10-CM | POA: Diagnosis not present

## 2019-07-02 DIAGNOSIS — E559 Vitamin D deficiency, unspecified: Secondary | ICD-10-CM | POA: Diagnosis not present

## 2019-07-02 DIAGNOSIS — K219 Gastro-esophageal reflux disease without esophagitis: Secondary | ICD-10-CM | POA: Diagnosis not present

## 2019-07-02 DIAGNOSIS — I10 Essential (primary) hypertension: Secondary | ICD-10-CM | POA: Diagnosis not present

## 2019-07-02 DIAGNOSIS — D696 Thrombocytopenia, unspecified: Secondary | ICD-10-CM | POA: Diagnosis not present

## 2019-07-02 LAB — HEMOGLOBIN A1C: Hemoglobin A1C, External: 6.2 % (ref 3.2–7.0)

## 2019-07-22 DIAGNOSIS — M722 Plantar fascial fibromatosis: Secondary | ICD-10-CM | POA: Diagnosis not present

## 2019-07-24 DIAGNOSIS — R7303 Prediabetes: Secondary | ICD-10-CM | POA: Diagnosis not present

## 2019-07-24 DIAGNOSIS — Z125 Encounter for screening for malignant neoplasm of prostate: Secondary | ICD-10-CM | POA: Diagnosis not present

## 2019-07-24 DIAGNOSIS — E559 Vitamin D deficiency, unspecified: Secondary | ICD-10-CM | POA: Diagnosis not present

## 2019-07-24 DIAGNOSIS — I10 Essential (primary) hypertension: Secondary | ICD-10-CM | POA: Diagnosis not present

## 2019-07-28 DIAGNOSIS — G4733 Obstructive sleep apnea (adult) (pediatric): Secondary | ICD-10-CM | POA: Diagnosis not present

## 2019-07-30 DIAGNOSIS — L578 Other skin changes due to chronic exposure to nonionizing radiation: Secondary | ICD-10-CM | POA: Diagnosis not present

## 2019-07-30 DIAGNOSIS — L820 Inflamed seborrheic keratosis: Secondary | ICD-10-CM | POA: Diagnosis not present

## 2019-07-31 DIAGNOSIS — Z0001 Encounter for general adult medical examination with abnormal findings: Secondary | ICD-10-CM | POA: Diagnosis not present

## 2019-07-31 DIAGNOSIS — R7303 Prediabetes: Secondary | ICD-10-CM | POA: Diagnosis not present

## 2019-07-31 DIAGNOSIS — Z8639 Personal history of other endocrine, nutritional and metabolic disease: Secondary | ICD-10-CM | POA: Diagnosis not present

## 2019-07-31 DIAGNOSIS — Z0000 Encounter for general adult medical examination without abnormal findings: Secondary | ICD-10-CM | POA: Diagnosis not present

## 2019-07-31 DIAGNOSIS — I480 Paroxysmal atrial fibrillation: Secondary | ICD-10-CM | POA: Diagnosis not present

## 2019-07-31 DIAGNOSIS — I10 Essential (primary) hypertension: Secondary | ICD-10-CM | POA: Diagnosis not present

## 2019-07-31 DIAGNOSIS — D696 Thrombocytopenia, unspecified: Secondary | ICD-10-CM | POA: Diagnosis not present

## 2019-08-01 ENCOUNTER — Other Ambulatory Visit: Admit: 2019-08-01 | Discharge: 2019-08-01 | Payer: MEDICARE | Primary: Internal Medicine

## 2019-08-01 DIAGNOSIS — C61 Malignant neoplasm of prostate: Secondary | ICD-10-CM

## 2019-08-02 LAB — PSA, ULTRASENSITIVE
PSA, ULTRASENSITIVE, 140735: 0.12 ng/mL (ref 0.000–4.000)
PSA, ULTRASENSITIVE: 0.12 ng/mL (ref 0.000–4.000)

## 2019-08-14 DIAGNOSIS — M722 Plantar fascial fibromatosis: Secondary | ICD-10-CM | POA: Diagnosis not present

## 2019-08-15 ENCOUNTER — Encounter: Payer: MEDICARE | Attending: Urology | Primary: Internal Medicine

## 2019-08-15 NOTE — Telephone Encounter (Signed)
Pt forgeot about appt today he needs to r/s this.

## 2019-08-15 NOTE — Telephone Encounter (Signed)
Appointment scheduled please advise patient

## 2019-08-26 NOTE — Telephone Encounter (Signed)
LMOM aith appt date and time./ds

## 2019-08-27 DIAGNOSIS — G4733 Obstructive sleep apnea (adult) (pediatric): Secondary | ICD-10-CM | POA: Diagnosis not present

## 2019-09-26 ENCOUNTER — Ambulatory Visit: Attending: Urology | Primary: Internal Medicine

## 2019-09-26 ENCOUNTER — Ambulatory Visit: Admit: 2019-09-26 | Discharge: 2019-09-26 | Payer: MEDICARE | Attending: Urology | Primary: Internal Medicine

## 2019-09-26 DIAGNOSIS — C61 Malignant neoplasm of prostate: Secondary | ICD-10-CM

## 2019-09-26 LAB — AMB POC URINALYSIS DIP STICK AUTO W/O MICRO (PGU)
Bilirubin (UA POC): NEGATIVE
Bilirubin, Urine, POC: NEGATIVE
Glucose (UA POC): NEGATIVE mg/dL
Glucose, Urine, POC: NEGATIVE mg/dL
KETONES, Urine, POC: NEGATIVE
Ketones (UA POC): NEGATIVE
Leukocyte Esterase, Urine, POC: NEGATIVE
Leukocyte esterase (UA POC): NEGATIVE
Nitrite, Urine, POC: NEGATIVE
Nitrites (UA POC): NEGATIVE
Protein (UA POC): NEGATIVE
Protein, Urine, POC: NEGATIVE
Specific Gravity, Urine, POC: 1.03 NA (ref 1.001–1.035)
Specific gravity (UA POC): 1.03 (ref 1.001–1.035)
Urobilinogen (POC): 0.2
Urobilinogen, POC: 0.2
pH (UA POC): 6 (ref 4.6–8.0)
pH, Urine, POC: 6 NA (ref 4.6–8.0)

## 2019-09-26 NOTE — Addendum Note (Signed)
Addendum Note by Laverle Hobby, DO at 09/26/19 1130                Author: Laverle Hobby, DO  Service: --  Author Type: Physician       Filed: 09/26/19 1213  Encounter Date: 09/26/2019  Status: Signed          Editor: Kiven Vangilder, Dione Booze, DO (Physician)          Addended by: Laverle Hobby on: 09/26/2019 12:13 PM    Modules accepted: Miquel Dunn

## 2019-09-26 NOTE — Progress Notes (Signed)
[facility] [place] [address]  [phone]    Jason Fernandez  DOB: 1945/11/28     HPI   74 y.o., male returns in follow up for CaP and BPH. ??Reports stable noct times 0-2x on Flomax and Proscar. ??Stream slow at times with mild daytime freq. 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 PSA was 0.091 on 01/25/18; 0.1 on 07/26/18 and is now 0.12 on 08/01/19. ??This was 0.095 on 07/13/17.??and0.069 on 01/05/17. Denies any hematuria. ??PVR was 19cc at??prior??visit.      Past Medical History:   Diagnosis Date   ??? Hypercholesterolemia    ??? Hypertension    ??? Personal history of prostate cancer      Past Surgical History:   Procedure Laterality Date   ??? HX OTHER SURGICAL      brachytherapy 3/04     Current Outpatient Medications   Medication Sig Dispense Refill   ??? donepeziL (ARICEPT) 5 mg tablet Take  by mouth nightly.     ??? finasteride (PROSCAR) 5 mg tablet Take 5 mg by mouth daily.     ??? aspirin delayed-release 81 mg tablet Take 4 Tabs by mouth daily. 30 Tab 1   ??? metFORMIN ER (GLUCOPHAGE XR) 500 mg tablet      ??? tamsulosin (FLOMAX) 0.4 mg capsule      ??? Cholecalciferol, Vitamin D3, (VITAMIN D3) 2,000 unit cap capsule Take  by mouth two (2) times a day.       Allergies   Allergen Reactions   ??? Lisinopril Hives   ??? Meloxicam Hives   ??? Oxaprozin Rash   ??? Sulfa (Sulfonamide Antibiotics) Rash     Social History     Socioeconomic History   ??? Marital status: MARRIED     Spouse name: Not on file   ??? Number of children: Not on file   ??? Years of education: Not on file   ??? Highest education level: Not on file   Occupational History   ??? Not on file   Social Needs   ??? Financial resource strain: Not on file   ??? Food insecurity     Worry: Not on file     Inability: Not on file   ??? Transportation needs     Medical: Not on file     Non-medical: Not on file   Tobacco Use   ??? Smoking status: Never Smoker   Substance and Sexual Activity   ??? Alcohol use: No   ??? Drug  use: Not on file   ??? Sexual activity: Not on file   Lifestyle   ??? Physical activity     Days per week: Not on file     Minutes per session: Not on file   ??? Stress: Not on file   Relationships   ??? Social Product manager on phone: Not on file     Gets together: Not on file     Attends religious service: Not on file     Active member of club or organization: Not on file     Attends meetings of clubs or organizations: Not on file     Relationship status: Not on file   ??? Intimate partner violence     Fear of current or ex partner: Not on file     Emotionally abused: Not on file     Physically abused: Not on file     Forced  sexual activity: Not on file   Other Topics Concern   ??? Not on file   Social History Narrative   ??? Not on file     No family history on file.    Review of Systems  All systems reviewed and are negative at this time.    Physical Exam  Visit Vitals  BP (!) 186/86   Pulse (!) 48   Temp 97 ??F (36.1 ??C) (Temporal)     General appearance - alert, well appearing, and in no distress  Mental status - alert, oriented to person, place, and time  Eyes - extraocular eye movements intact, sclera anicteric  Abdomen - soft, nontender, nondistended, no masses or organomegaly  Neurological -  normal speech, no focal findings or movement disorder noted  Skin - normal coloration and turgor      Urinalysis  UA - Dipstick  Results for orders placed or performed in visit on 09/26/19   AMB POC URINALYSIS DIP STICK AUTO W/O MICRO (PGU)     Status: None   Result Value Ref Range Status    Glucose (UA POC) Negative Negative mg/dL Final    Bilirubin (UA POC) Negative Negative Final    Ketones (UA POC) Negative Negative Final    Specific gravity (UA POC) 1.030 1.001 - 1.035 Final    Blood (UA POC) Trace Negative Final    pH (UA POC) 6.0 4.6 - 8.0 Final    Protein (UA POC) Negative Negative Final    Urobilinogen (POC) 0.2 mg/dL  Final    Nitrites (UA POC) Negative Negative Final    Leukocyte esterase (UA POC) Negative Negative  Final   Results for orders placed or performed in visit on 07/20/17   AMB POC URINALYSIS DIP STICK AUTO W/ MICRO (PGU)     Status: None   Result Value Ref Range Status    Glucose (UA POC) Negative Negative mg/dL Final    Bilirubin (UA POC) Negative Negative Final    Ketones (UA POC) Negative Negative Final    Specific gravity (UA POC) 1.020 1.001 - 1.035 Final    Blood (UA POC) Negative Negative Final    pH (UA POC) 7 4.6 - 8.0 Final    Protein (UA POC) Negative Negative Final    Urobilinogen (POC) 0.2 mg/dL  Final    Nitrites (UA POC) Negative Negative Final    Leukocyte esterase (UA POC) Negative Negative Final       UA - Micro  WBC - 0  RBC - 0  Bacteria - 0  Epith - 0    Assessment/Plan    ICD-10-CM ICD-9-CM    1. Prostate cancer (HCC)  C61 185 AMB POC URINALYSIS DIP STICK AUTO W/O MICRO (PGU)      PSA, ULTRASENSITIVE   2. Benign prostatic hyperplasia, unspecified whether lower urinary tract symptoms present  N40.0 600.00 AMB POC URINALYSIS DIP STICK AUTO W/O MICRO (PGU)     Cont Flomax and Proscar.  Pt may attempt to titrate off Flomax.  RTO in 12 mo with PSA prior.    Dione Booze Hannie Shoe, DO

## 2019-09-26 NOTE — Progress Notes (Signed)
[facility] [place] [address]  [phone]    Jason Fernandez  DOB: 27-Nov-1945     HPI   74 y.o., male returns in follow up for CaP and BPH. ??Reports stable noct times 0-2x on Flomax and Proscar. ??Stream slow at times with mild daytime freq. 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 PSA was 0.091 on 01/25/18; 0.1 on 07/26/18 and is now 0.12 on 08/01/19. ??This was 0.095 on 07/13/17.??and0.069 on 01/05/17. Denies any hematuria. ??PVR was 19cc at??prior??visit.      Past Medical History:   Diagnosis Date   ??? Hypercholesterolemia    ??? Hypertension    ??? Personal history of prostate cancer      Past Surgical History:   Procedure Laterality Date   ??? HX OTHER SURGICAL      brachytherapy 3/04     Current Outpatient Medications   Medication Sig Dispense Refill   ??? donepeziL (ARICEPT) 5 mg tablet Take  by mouth nightly.     ??? finasteride (PROSCAR) 5 mg tablet Take 5 mg by mouth daily.     ??? aspirin delayed-release 81 mg tablet Take 4 Tabs by mouth daily. 30 Tab 1   ??? metFORMIN ER (GLUCOPHAGE XR) 500 mg tablet      ??? tamsulosin (FLOMAX) 0.4 mg capsule      ??? Cholecalciferol, Vitamin D3, (VITAMIN D3) 2,000 unit cap capsule Take  by mouth two (2) times a day.       Allergies   Allergen Reactions   ??? Lisinopril Hives   ??? Meloxicam Hives   ??? Oxaprozin Rash   ??? Sulfa (Sulfonamide Antibiotics) Rash     Social History     Socioeconomic History   ??? Marital status: MARRIED     Spouse name: Not on file   ??? Number of children: Not on file   ??? Years of education: Not on file   ??? Highest education level: Not on file   Occupational History   ??? Not on file   Social Needs   ??? Financial resource strain: Not on file   ??? Food insecurity     Worry: Not on file     Inability: Not on file   ??? Transportation needs     Medical: Not on file     Non-medical: Not on file   Tobacco Use   ??? Smoking status: Never Smoker   Substance and Sexual Activity   ??? Alcohol use: No    ??? Drug use: Not on file   ??? Sexual activity: Not on file   Lifestyle   ??? Physical activity     Days per week: Not on file     Minutes per session: Not on file   ??? Stress: Not on file   Relationships   ??? Social Product manager on phone: Not on file     Gets together: Not on file     Attends religious service: Not on file     Active member of club or organization: Not on file     Attends meetings of clubs or organizations: Not on file     Relationship status: Not on file   ??? Intimate partner violence     Fear of current or ex partner: Not on file     Emotionally abused: Not on file     Physically abused: Not on file     Forced  sexual activity: Not on file   Other Topics Concern   ??? Not on file   Social History Narrative   ??? Not on file     No family history on file.    Review of Systems  All systems reviewed and are negative at this time.    Physical Exam  Visit Vitals  BP (!) 186/86   Pulse (!) 48   Temp 97 ??F (36.1 ??C) (Temporal)     General appearance - alert, well appearing, and in no distress  Mental status - alert, oriented to person, place, and time  Eyes - extraocular eye movements intact, sclera anicteric  Abdomen - soft, nontender, nondistended, no masses or organomegaly  Neurological -  normal speech, no focal findings or movement disorder noted  Skin - normal coloration and turgor      Urinalysis  UA - Dipstick  Results for orders placed or performed in visit on 09/26/19   AMB POC URINALYSIS DIP STICK AUTO W/O MICRO (PGU)     Status: None   Result Value Ref Range Status    Glucose (UA POC) Negative Negative mg/dL Final    Bilirubin (UA POC) Negative Negative Final    Ketones (UA POC) Negative Negative Final    Specific gravity (UA POC) 1.030 1.001 - 1.035 Final    Blood (UA POC) Trace Negative Final    pH (UA POC) 6.0 4.6 - 8.0 Final    Protein (UA POC) Negative Negative Final    Urobilinogen (POC) 0.2 mg/dL  Final    Nitrites (UA POC) Negative Negative Final     Leukocyte esterase (UA POC) Negative Negative Final   Results for orders placed or performed in visit on 07/20/17   AMB POC URINALYSIS DIP STICK AUTO W/ MICRO (PGU)     Status: None   Result Value Ref Range Status    Glucose (UA POC) Negative Negative mg/dL Final    Bilirubin (UA POC) Negative Negative Final    Ketones (UA POC) Negative Negative Final    Specific gravity (UA POC) 1.020 1.001 - 1.035 Final    Blood (UA POC) Negative Negative Final    pH (UA POC) 7 4.6 - 8.0 Final    Protein (UA POC) Negative Negative Final    Urobilinogen (POC) 0.2 mg/dL  Final    Nitrites (UA POC) Negative Negative Final    Leukocyte esterase (UA POC) Negative Negative Final       UA - Micro  WBC - 0  RBC - 0  Bacteria - 0  Epith - 0    Assessment/Plan    ICD-10-CM ICD-9-CM    1. Prostate cancer (HCC)  C61 185 AMB POC URINALYSIS DIP STICK AUTO W/O MICRO (PGU)      PSA, ULTRASENSITIVE   2. Benign prostatic hyperplasia, unspecified whether lower urinary tract symptoms present  N40.0 600.00 AMB POC URINALYSIS DIP STICK AUTO W/O MICRO (PGU)     Cont Flomax and Proscar.  Pt may attempt to titrate off Flomax.  RTO in 12 mo with PSA prior.    Dione Booze Arelys Glassco, DO

## 2019-09-26 NOTE — Addendum Note (Signed)
Addended by: Laverle Hobby on: 09/26/2019 12:13 PM     Modules accepted: Miquel Dunn

## 2019-09-30 DIAGNOSIS — G4733 Obstructive sleep apnea (adult) (pediatric): Secondary | ICD-10-CM | POA: Diagnosis not present

## 2019-10-01 LAB — HEMOGLOBIN A1C: Hemoglobin A1C, External: 6.3 % (ref 3.2–7.0)

## 2019-10-14 DIAGNOSIS — L820 Inflamed seborrheic keratosis: Secondary | ICD-10-CM | POA: Diagnosis not present

## 2019-10-16 DIAGNOSIS — H9313 Tinnitus, bilateral: Secondary | ICD-10-CM | POA: Diagnosis not present

## 2019-10-16 DIAGNOSIS — H903 Sensorineural hearing loss, bilateral: Secondary | ICD-10-CM | POA: Diagnosis not present

## 2019-10-30 DIAGNOSIS — G4733 Obstructive sleep apnea (adult) (pediatric): Secondary | ICD-10-CM | POA: Diagnosis not present

## 2019-11-07 DIAGNOSIS — E0520 Thyrotoxicosis with toxic multinodular goiter without thyrotoxic crisis or storm: Secondary | ICD-10-CM | POA: Diagnosis not present

## 2019-11-14 DIAGNOSIS — E0520 Thyrotoxicosis with toxic multinodular goiter without thyrotoxic crisis or storm: Secondary | ICD-10-CM | POA: Diagnosis not present

## 2019-12-01 DIAGNOSIS — G4733 Obstructive sleep apnea (adult) (pediatric): Secondary | ICD-10-CM | POA: Diagnosis not present

## 2019-12-31 DIAGNOSIS — G4733 Obstructive sleep apnea (adult) (pediatric): Secondary | ICD-10-CM | POA: Diagnosis not present

## 2020-01-08 DIAGNOSIS — H2513 Age-related nuclear cataract, bilateral: Secondary | ICD-10-CM | POA: Diagnosis not present

## 2020-01-21 LAB — HEMOGLOBIN A1C: Hemoglobin A1C, External: 6.2 % (ref 3.2–7.0)

## 2020-01-27 DIAGNOSIS — Z01818 Encounter for other preprocedural examination: Secondary | ICD-10-CM | POA: Diagnosis not present

## 2020-01-27 DIAGNOSIS — R0600 Dyspnea, unspecified: Secondary | ICD-10-CM | POA: Diagnosis not present

## 2020-01-30 DIAGNOSIS — G4733 Obstructive sleep apnea (adult) (pediatric): Secondary | ICD-10-CM | POA: Diagnosis not present

## 2020-02-19 ENCOUNTER — Other Ambulatory Visit: Payer: Self-pay

## 2020-02-19 ENCOUNTER — Ambulatory Visit: Payer: PPO | Admitting: Dermatology

## 2020-02-19 DIAGNOSIS — D229 Melanocytic nevi, unspecified: Secondary | ICD-10-CM

## 2020-02-19 DIAGNOSIS — L409 Psoriasis, unspecified: Secondary | ICD-10-CM

## 2020-02-19 DIAGNOSIS — I8393 Asymptomatic varicose veins of bilateral lower extremities: Secondary | ICD-10-CM | POA: Diagnosis not present

## 2020-02-19 DIAGNOSIS — L821 Other seborrheic keratosis: Secondary | ICD-10-CM

## 2020-02-19 DIAGNOSIS — L719 Rosacea, unspecified: Secondary | ICD-10-CM | POA: Diagnosis not present

## 2020-02-19 DIAGNOSIS — L814 Other melanin hyperpigmentation: Secondary | ICD-10-CM

## 2020-02-19 DIAGNOSIS — Z1283 Encounter for screening for malignant neoplasm of skin: Secondary | ICD-10-CM

## 2020-02-19 DIAGNOSIS — L578 Other skin changes due to chronic exposure to nonionizing radiation: Secondary | ICD-10-CM | POA: Diagnosis not present

## 2020-02-19 MED ORDER — DOXYCYCLINE HYCLATE 50 MG PO CAPS: 50.0000 mg | ORAL_CAPSULE | Freq: Every day | ORAL | 0 refills | Status: DC

## 2020-02-19 MED ORDER — METRONIDAZOLE 0.75 % EX CREA: TOPICAL_CREAM | Freq: Every day | CUTANEOUS | 11 refills | Status: DC

## 2020-02-19 MED ORDER — HALOBETASOL PROPIONATE 0.05 % EX CREA: TOPICAL_CREAM | CUTANEOUS | 3 refills | Status: DC

## 2020-02-19 NOTE — Progress Notes (Signed)
   Follow-Up Visit   Subjective  Jimmy Obrien is a 74 y.o. male who presents for the following: Annual Exam (Total body skin exam today) and Rash (face x 2wks, oozes sometimes, tried sal acid cream, peroxide, ultravate cr).  The patient presents for Total-Body Skin Exam (TBSE) for skin cancer screening and mole check.  The following portions of the chart were reviewed this encounter and updated as appropriate:  Tobacco  Allergies  Meds  Problems  Med Hx  Surg Hx  Fam Hx      Review of Systems:  No other skin or systemic complaints except as noted in HPI or Assessment and Plan.  Objective  Well appearing patient in no apparent distress; mood and affect are within normal limits.  A full examination was performed including scalp, head, eyes, ears, nose, lips, neck, chest, axillae, abdomen, back, buttocks, bilateral upper extremities, bilateral lower extremities, hands, feet, fingers, toes, fingernails, and toenails. All findings within normal limits unless otherwise noted below.  Objective  face: Pustuls and indurated pink plaque R nasal ala  Objective  bil hands: Erythema and peeling R palm   Assessment & Plan    Lentigines - Scattered tan macules - Discussed due to sun exposure - Benign, observe - Call for any changes  Seborrheic Keratoses - Stuck-on, waxy, tan-brown papules and plaques  - Discussed benign etiology and prognosis. - Observe - Call for any changes  Melanocytic Nevi - Tan-brown and/or pink-flesh-colored symmetric macules and papules - Benign appearing on exam today - Observation - Call clinic for new or changing moles - Recommend daily use of broad spectrum spf 30+ sunscreen to sun-exposed areas.   Actinic Damage - diffuse scaly erythematous macules with underlying dyspigmentation - Recommend daily broad spectrum sunscreen SPF 30+ to sun-exposed areas, reapply every 2 hours as needed.  - Call for new or changing lesions.  Skin cancer  screening performed today.   Rosacea face  Start Metrocream qhs Start Doxycycline 50mg  1 po qd with food and drink for 1 month (aware of pts tetracycline allergy, reaction was inflammation in groin, may be r/t old tetracycline, advised pt he may d/c doxycycline if he notices same rxn).  doxycycline (VIBRAMYCIN) 50 MG capsule - face  metroNIDAZOLE (METROCREAM) 0.75 % cream - face  Varicose veins of both lower extremities, unspecified whether complicated bil lower legs  Benign, observe  Psoriasis bil hands  Cont Ultravate cream qd up to 5d/wk aa hands until clear, avoid f/g/a  halobetasol (ULTRAVATE) 0.05 % cream - bil hands  Skin cancer screening May consider Otezla in future?  Return in about 1 year (around 02/18/2021) for TBSE.  I, Othelia Pulling, RMA, am acting as scribe for Sarina Ser, MD .  Documentation: I have reviewed the above documentation for accuracy and completeness, and I agree with the above.  Sarina Ser, MD

## 2020-02-23 ENCOUNTER — Encounter: Payer: Self-pay | Admitting: Dermatology

## 2020-02-24 DIAGNOSIS — J340 Abscess, furuncle and carbuncle of nose: Secondary | ICD-10-CM | POA: Diagnosis not present

## 2020-02-24 DIAGNOSIS — R0981 Nasal congestion: Secondary | ICD-10-CM | POA: Diagnosis not present

## 2020-03-02 DIAGNOSIS — G4733 Obstructive sleep apnea (adult) (pediatric): Secondary | ICD-10-CM | POA: Diagnosis not present

## 2020-03-16 DIAGNOSIS — K219 Gastro-esophageal reflux disease without esophagitis: Secondary | ICD-10-CM | POA: Diagnosis not present

## 2020-03-16 DIAGNOSIS — R519 Headache, unspecified: Secondary | ICD-10-CM | POA: Diagnosis not present

## 2020-03-16 DIAGNOSIS — J329 Chronic sinusitis, unspecified: Secondary | ICD-10-CM | POA: Diagnosis not present

## 2020-03-16 DIAGNOSIS — J384 Edema of larynx: Secondary | ICD-10-CM | POA: Diagnosis not present

## 2020-03-25 DIAGNOSIS — J019 Acute sinusitis, unspecified: Secondary | ICD-10-CM | POA: Diagnosis not present

## 2020-03-26 DIAGNOSIS — N401 Enlarged prostate with lower urinary tract symptoms: Secondary | ICD-10-CM | POA: Diagnosis not present

## 2020-04-02 DIAGNOSIS — G4733 Obstructive sleep apnea (adult) (pediatric): Secondary | ICD-10-CM | POA: Diagnosis not present

## 2020-04-06 ENCOUNTER — Other Ambulatory Visit: Payer: Self-pay | Admitting: Student

## 2020-04-06 DIAGNOSIS — K2950 Unspecified chronic gastritis without bleeding: Secondary | ICD-10-CM

## 2020-04-06 DIAGNOSIS — K219 Gastro-esophageal reflux disease without esophagitis: Secondary | ICD-10-CM

## 2020-04-08 ENCOUNTER — Ambulatory Visit
Admission: RE | Admit: 2020-04-08 | Discharge: 2020-04-08 | Disposition: A | Discharge status: Home / Self Care | Payer: PPO | Source: Ambulatory Visit | Attending: Student | Admitting: Student

## 2020-04-08 ENCOUNTER — Other Ambulatory Visit: Payer: Self-pay

## 2020-04-08 DIAGNOSIS — K3189 Other diseases of stomach and duodenum: Secondary | ICD-10-CM | POA: Diagnosis not present

## 2020-04-08 DIAGNOSIS — K224 Dyskinesia of esophagus: Secondary | ICD-10-CM | POA: Diagnosis not present

## 2020-04-08 DIAGNOSIS — K219 Gastro-esophageal reflux disease without esophagitis: Secondary | ICD-10-CM | POA: Diagnosis not present

## 2020-04-08 DIAGNOSIS — K2950 Unspecified chronic gastritis without bleeding: Secondary | ICD-10-CM

## 2020-04-08 DIAGNOSIS — K5790 Diverticulosis of intestine, part unspecified, without perforation or abscess without bleeding: Secondary | ICD-10-CM | POA: Diagnosis not present

## 2020-04-15 DIAGNOSIS — J4530 Mild persistent asthma, uncomplicated: Secondary | ICD-10-CM | POA: Diagnosis not present

## 2020-04-15 DIAGNOSIS — J3089 Other allergic rhinitis: Secondary | ICD-10-CM | POA: Diagnosis not present

## 2020-05-03 NOTE — Telephone Encounter (Signed)
 Pt's wife called to report patient is having pain in the left groin area that comes and goes. Sometimes, he can hardly walk  He has increased urinary frequency, no fever, no burning.  Heat and Motrin helped a little.

## 2020-05-04 DIAGNOSIS — G4733 Obstructive sleep apnea (adult) (pediatric): Secondary | ICD-10-CM | POA: Diagnosis not present

## 2020-05-04 NOTE — Telephone Encounter (Signed)
He probably needs to see his PCP first.  May be hernia.

## 2020-05-06 LAB — HEMOGLOBIN A1C: Hemoglobin A1C, External: 6.2 % (ref 3.2–7.0)

## 2020-05-28 ENCOUNTER — Ambulatory Visit: Attending: Adult Health | Primary: Internal Medicine

## 2020-05-28 ENCOUNTER — Ambulatory Visit: Admit: 2020-05-28 | Discharge: 2020-05-28 | Payer: MEDICARE | Attending: Adult Health | Primary: Internal Medicine

## 2020-05-28 DIAGNOSIS — R300 Dysuria: Secondary | ICD-10-CM

## 2020-05-28 LAB — AMB POC URINALYSIS DIP STICK AUTO W/O MICRO (PGU)
Bilirubin (UA POC): NEGATIVE
Bilirubin, Urine, POC: NEGATIVE
Glucose (UA POC): NEGATIVE mg/dL
Glucose, Urine, POC: NEGATIVE mg/dL
KETONES, Urine, POC: NEGATIVE
Ketones (UA POC): NEGATIVE
Nitrite, Urine, POC: NEGATIVE
Nitrites (UA POC): NEGATIVE
Protein (UA POC): NEGATIVE
Protein, Urine, POC: NEGATIVE
Specific Gravity, Urine, POC: 1.02 NA (ref 1.001–1.035)
Specific gravity (UA POC): 1.02 (ref 1.001–1.035)
Urobilinogen (POC): 0.2
Urobilinogen, POC: 0.2
pH (UA POC): 5 (ref 4.6–8.0)
pH, Urine, POC: 5 NA (ref 4.6–8.0)

## 2020-05-28 MED ORDER — DOXYCYCLINE 100 MG CAP
100 mg | ORAL_CAPSULE | Freq: Two times a day (BID) | ORAL | 0 refills | Status: DC
Start: 2020-05-28 — End: 2020-06-18

## 2020-05-28 NOTE — Progress Notes (Signed)
[facility]  [address] [place] [address]  [phone]          Jason Fernandez  DOB: Nov 05, 1945    Chief Complaint   Patient presents with   ??? Other     pain in groin          HPI     Jason Fernandez is a 74 y.o. male      Hx of CaP and BPH.  Here today with complaints of pain with urination.  He reports he is not having a lot of burning there is just some discomfort.  Urinalysis today reveals moderate blood but no significant bacteria.  He feels that his voiding stream and flow are normal.     Reports stable noct times??0-2x??now off Flomax and Proscar. ?? 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 PSA was 0.091 on 01/25/18; 0.1 on 07/26/18 and is now 0.12 on 08/01/19. ??This was 0.095 on 07/13/17.??and0.069 on 01/05/17. Denies any hematuria. ??PVR was 19cc at??prior??visit.  ??    Past Medical History:   Diagnosis Date   ??? Hypercholesterolemia    ??? Hypertension    ??? Personal history of prostate cancer      Past Surgical History:   Procedure Laterality Date   ??? HX OTHER SURGICAL      brachytherapy 3/04     Current Outpatient Medications   Medication Sig Dispense Refill   ??? doxycycline (MONODOX) 100 mg capsule Take 1 Capsule by mouth two (2) times a Fernandez. 20 Capsule 0   ??? donepeziL (ARICEPT) 5 mg tablet Take  by mouth nightly.     ??? finasteride (PROSCAR) 5 mg tablet Take 5 mg by mouth daily.     ??? aspirin delayed-release 81 mg tablet Take 4 Tabs by mouth daily. 30 Tab 1   ??? metFORMIN ER (GLUCOPHAGE XR) 500 mg tablet      ??? tamsulosin (FLOMAX) 0.4 mg capsule      ??? Cholecalciferol, Vitamin D3, (VITAMIN D3) 2,000 unit cap capsule Take  by mouth two (2) times a Fernandez.       Allergies   Allergen Reactions   ??? Lisinopril Hives   ??? Meloxicam Hives   ??? Oxaprozin Rash   ??? Sulfa (Sulfonamide Antibiotics) Rash     Social History     Socioeconomic History   ??? Marital status: MARRIED     Spouse name: Not on file   ??? Number of children: Not on file   ??? Years  of education: Not on file   ??? Highest education level: Not on file   Occupational History   ??? Not on file   Tobacco Use   ??? Smoking status: Never Smoker   Substance and Sexual Activity   ??? Alcohol use: No   ??? Drug use: Not on file   ??? Sexual activity: Not on file   Other Topics Concern   ??? Not on file   Social History Narrative   ??? Not on file     Social Determinants of Health     Financial Resource Strain:    ??? Difficulty of Paying Living Expenses:    Food Insecurity:    ??? Worried About Charity fundraiser in the Last Year:    ??? Ran Out of Food in the Last Year:    Transportation Needs:    ??? Lack of Transportation (Medical):    ??? Lack of  Transportation (Non-Medical):    Physical Activity:    ??? Days of Exercise per Week:    ??? Minutes of Exercise per Session:    Stress:    ??? Feeling of Stress :    Social Connections:    ??? Frequency of Communication with Friends and Family:    ??? Frequency of Social Gatherings with Friends and Family:    ??? Attends Religious Services:    ??? Marine scientist or Organizations:    ??? Attends Music therapist:    ??? Marital Status:    Intimate Production manager Violence:    ??? Fear of Current or Ex-Partner:    ??? Emotionally Abused:    ??? Physically Abused:    ??? Sexually Abused:      No family history on file.    Review of Systems  Constitutional:   Negative for fever and headaches.  ENT:  Negative for high frequency hearing loss.  Respiratory:  Negative for shortness of breath.  Cardiovascular:  Negative for chest pain.  GI:  Negative for abdominal pain.  Genitourinary:  Negative for urinary burning and hematuria.  Musculoskeletal:  Negative for back pain.  Neurological:  Negative for numbness.  Psychological:  Negative for depression.  Endocrine:  Negative for fatigue.  Hem/Lymphatic:  Negative for easy bruising.      Urinalysis  UA - Dipstick  Results for orders placed or performed in visit on 05/28/20   AMB POC URINALYSIS DIP STICK AUTO W/O MICRO (PGU)     Status: None   Result Value  Ref Range Status    Glucose (UA POC) Negative Negative mg/dL Final    Bilirubin (UA POC) Negative Negative Final    Ketones (UA POC) Negative Negative Final    Specific gravity (UA POC) 1.020 1.001 - 1.035 Final    Blood (UA POC) Moderate Negative Final    pH (UA POC) 5.0 4.6 - 8.0 Final    Protein (UA POC) Negative Negative Final    Urobilinogen (POC) 0.2 mg/dL  Final    Nitrites (UA POC) Negative Negative Final    Leukocyte esterase (UA POC) Small Negative Final   Results for orders placed or performed in visit on 07/20/17   AMB POC URINALYSIS DIP STICK AUTO W/ MICRO (PGU)     Status: None   Result Value Ref Range Status    Glucose (UA POC) Negative Negative mg/dL Final    Bilirubin (UA POC) Negative Negative Final    Ketones (UA POC) Negative Negative Final    Specific gravity (UA POC) 1.020 1.001 - 1.035 Final    Blood (UA POC) Negative Negative Final    pH (UA POC) 7 4.6 - 8.0 Final    Protein (UA POC) Negative Negative Final    Urobilinogen (POC) 0.2 mg/dL  Final    Nitrites (UA POC) Negative Negative Final    Leukocyte esterase (UA POC) Negative Negative Final       PHYSICAL EXAM    GENERAL: No acute distress, Awake, Alert, Oriented X 3, Gait normal  ABDOMEN: soft, non tender, non-distended, positive bowel sounds, no organomegaly, no palpable masses, no guarding, no rebound tenderness  SKIN: No rash, no erythema, no lacerations or abrasions, no ecchymosis  MUSCULOSKELETAL - MAEW, no edema     Assessment and Plan    ICD-10-CM ICD-9-CM    1. Dysuria  R30.0 788.1    2. Prostate cancer (HCC)  C61 185 AMB POC URINALYSIS DIP STICK AUTO W/O MICRO (  PGU)   3. Inguinal pain, unspecified laterality  R10.30 789.09 AMB POC URINALYSIS DIP STICK AUTO W/O MICRO (PGU)     PLAN:  We will treat patient with doxycycline 100 mg she will take it twice a Fernandez for 10 days  Follow-up in 3 weeks for recheck  If hematuria still present consider hematuria work-up  Patient understands to call the office prior to this visit if any symptoms  worsen.    Holli Humbles, NP  Dr. Delaney Meigs is supervising physician today and he approves plan of care.    Follow-up and Dispositions    ?? Return in about 3 weeks (around 06/18/2020) for for recheck.       Elements of this note have been dictated using speech recognition software.  Although reviewed, errors of speech recognition may have occurred.

## 2020-06-03 DIAGNOSIS — G4733 Obstructive sleep apnea (adult) (pediatric): Secondary | ICD-10-CM | POA: Diagnosis not present

## 2020-06-17 DIAGNOSIS — Z1152 Encounter for screening for COVID-19: Secondary | ICD-10-CM | POA: Diagnosis not present

## 2020-06-17 DIAGNOSIS — Z03818 Encounter for observation for suspected exposure to other biological agents ruled out: Secondary | ICD-10-CM | POA: Diagnosis not present

## 2020-06-18 ENCOUNTER — Ambulatory Visit: Attending: Adult Health | Primary: Internal Medicine

## 2020-06-18 ENCOUNTER — Ambulatory Visit: Admit: 2020-06-18 | Discharge: 2020-06-18 | Payer: MEDICARE | Attending: Adult Health | Primary: Internal Medicine

## 2020-06-18 DIAGNOSIS — J0101 Acute recurrent maxillary sinusitis: Secondary | ICD-10-CM | POA: Diagnosis not present

## 2020-06-18 DIAGNOSIS — R300 Dysuria: Secondary | ICD-10-CM

## 2020-06-18 MED ORDER — DOXYCYCLINE 100 MG CAP
100 mg | ORAL_CAPSULE | Freq: Two times a day (BID) | ORAL | 0 refills | Status: AC
Start: 2020-06-18 — End: ?

## 2020-06-18 NOTE — Progress Notes (Signed)
[name]  [address] [place] [address]  [phone]          Jason Fernandez  DOB: Jan 13, 1946    Chief Complaint   Patient presents with   ??? Dysuria   ??? Prostate Cancer          HPI     Jason Fernandez is a 74 y.o. 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  Here today for follow-up on burning with urination.  Patient was given a 10 day supply of doxycycline and reports that his symptoms have much improved.  He was unable to give me a urine sample today.  His previous urine did show microscopic blood.    Reports stable noct times??0-2x??now off Flomax and Proscar. ?? 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 PSA??was??0.091 on 01/25/18;??0.1 on 07/26/18 and is now 0.12 on 08/01/19. ??This was 0.095 on 07/13/17.??and0.069 on 01/05/17. Denies any hematuria. ??PVR was 19cc at??prior??visit.  ??    Past Medical History:   Diagnosis Date   ??? Hypercholesterolemia    ??? Hypertension    ??? Personal history of prostate cancer      Past Surgical History:   Procedure Laterality Date   ??? HX OTHER SURGICAL      brachytherapy 3/04     Current Outpatient Medications   Medication Sig Dispense Refill   ??? doxycycline (MONODOX) 100 mg capsule Take 1 Capsule by mouth two (2) times a day. 20 Capsule 0   ??? donepeziL (ARICEPT) 5 mg tablet Take  by mouth nightly.     ??? finasteride (PROSCAR) 5 mg tablet Take 5 mg by mouth daily.     ??? aspirin delayed-release 81 mg tablet Take 4 Tabs by mouth daily. 30 Tab 1   ??? metFORMIN ER (GLUCOPHAGE XR) 500 mg tablet      ??? tamsulosin (FLOMAX) 0.4 mg capsule      ??? Cholecalciferol, Vitamin D3, (VITAMIN D3) 2,000 unit cap capsule Take  by mouth two (2) times a day.       Allergies   Allergen Reactions   ??? Lisinopril Hives   ??? Meloxicam Hives   ??? Oxaprozin Rash   ??? Sulfa (Sulfonamide Antibiotics) Rash     Social History     Socioeconomic History   ??? Marital status: MARRIED     Spouse name: Not on file   ??? Number of children: Not on file   ??? Years of education:  Not on file   ??? Highest education level: Not on file   Occupational History   ??? Not on file   Tobacco Use   ??? Smoking status: Never Smoker   Substance and Sexual Activity   ??? Alcohol use: No   ??? Drug use: Not on file   ??? Sexual activity: Not on file   Other Topics Concern   ??? Not on file   Social History Narrative   ??? Not on file     Social Determinants of Health     Financial Resource Strain:    ??? Difficulty of Paying Living Expenses:    Food Insecurity:    ??? Worried About Charity fundraiser in the Last Year:    ??? Ran Out of Food in the Last Year:    Transportation Needs:    ??? Lack of Transportation (Medical):    ??? Lack of Transportation (Non-Medical):    Physical Activity:    ??? Days of  Exercise per Week:    ??? Minutes of Exercise per Session:    Stress:    ??? Feeling of Stress :    Social Connections:    ??? Frequency of Communication with Friends and Family:    ??? Frequency of Social Gatherings with Friends and Family:    ??? Attends Religious Services:    ??? Marine scientist or Organizations:    ??? Attends Music therapist:    ??? Marital Status:    Intimate Production manager Violence:    ??? Fear of Current or Ex-Partner:    ??? Emotionally Abused:    ??? Physically Abused:    ??? Sexually Abused:      No family history on file.    Review of Systems  Constitutional:   Negative for fever and headaches.  ENT:  Negative for high frequency hearing loss.  Respiratory:  Negative for shortness of breath.  Cardiovascular:  Negative for chest pain.  GI:  Negative for abdominal pain.  Genitourinary:  Negative for urinary burning and hematuria.  Musculoskeletal:  Negative for back pain.  Neurological:  Negative for numbness.  Psychological:  Negative for depression.  Endocrine:  Negative for fatigue.  Hem/Lymphatic:  Negative for easy bruising.        PHYSICAL EXAM    GENERAL: No acute distress, Awake, Alert, Oriented X 3, Gait normal  ABDOMEN: soft, non tender, non-distended, positive bowel sounds, no organomegaly, no palpable  masses, no guarding, no rebound tenderness  SKIN: No rash, no erythema, no lacerations or abrasions, no ecchymosis  MUSCULOSKELETAL - MAEW, no edema     Assessment and Plan    ICD-10-CM ICD-9-CM    1. Dysuria  R30.0 788.1    2. Benign prostatic hyperplasia, unspecified whether lower urinary tract symptoms present  N40.0 600.00    3. Prostate cancer ([place])  C61 185      PLAN:  Patient feels that he is back to his baseline  I will send in a refill of antibiotic for him to keep on hand in case symptoms return  I have asked him to bring a urine specimen back in 6 weeks for recheck.  If there is any hematuria in it we may need to proceed with work-up.    Holli Humbles, NP  Dr. Delaney Meigs is supervising physician today and he approves plan of care.    Follow-up and Dispositions    ?? Return for 6 week urine spec check .       Elements of this note have been dictated using speech recognition software.  Although reviewed, errors of speech recognition may have occurred.

## 2020-06-22 DIAGNOSIS — R0989 Other specified symptoms and signs involving the circulatory and respiratory systems: Secondary | ICD-10-CM | POA: Diagnosis not present

## 2020-06-22 DIAGNOSIS — R059 Cough, unspecified: Secondary | ICD-10-CM | POA: Diagnosis not present

## 2020-07-02 DIAGNOSIS — J0101 Acute recurrent maxillary sinusitis: Secondary | ICD-10-CM | POA: Diagnosis not present

## 2020-07-02 DIAGNOSIS — J320 Chronic maxillary sinusitis: Secondary | ICD-10-CM | POA: Diagnosis not present

## 2020-07-05 DIAGNOSIS — G4733 Obstructive sleep apnea (adult) (pediatric): Secondary | ICD-10-CM | POA: Diagnosis not present

## 2020-07-18 ENCOUNTER — Inpatient Hospital Stay
Admit: 2020-07-18 | Discharge: 2020-07-19 | Disposition: A | Discharge status: Home / Self Care | Payer: MEDICARE | Attending: Emergency Medicine

## 2020-07-18 ENCOUNTER — Emergency Department: Admit: 2020-07-18 | Payer: MEDICARE | Primary: Internal Medicine

## 2020-07-18 DIAGNOSIS — K6289 Other specified diseases of anus and rectum: Secondary | ICD-10-CM

## 2020-07-18 LAB — COMPREHENSIVE METABOLIC PANEL
ALT: 44 U/L (ref 12–65)
AST: 40 U/L — ABNORMAL HIGH (ref 15–37)
Albumin/Globulin Ratio: 1 — ABNORMAL LOW (ref 1.2–3.5)
Albumin: 3.9 g/dL (ref 3.2–4.6)
Alkaline Phosphatase: 76 U/L (ref 50–136)
Anion Gap: 7 mmol/L (ref 7–16)
BUN: 20 MG/DL (ref 8–23)
CO2: 23 mmol/L (ref 21–32)
Calcium: 9.4 MG/DL (ref 8.3–10.4)
Chloride: 105 mmol/L (ref 98–107)
Creatinine: 1.28 MG/DL (ref 0.8–1.5)
EGFR IF NonAfrican American: 58 mL/min/{1.73_m2} — ABNORMAL LOW (ref >60)
GFR African American: >60 mL/min/{1.73_m2} (ref >60)
Globulin: 4 g/dL — ABNORMAL HIGH (ref 2.3–3.5)
Glucose: 142 mg/dL — ABNORMAL HIGH (ref 65–100)
Potassium: 3.8 mmol/L (ref 3.5–5.1)
Sodium: 135 mmol/L — ABNORMAL LOW (ref 138–145)
Total Bilirubin: 0.8 MG/DL (ref 0.2–1.1)
Total Protein: 7.9 g/dL (ref 6.3–8.2)

## 2020-07-18 LAB — CBC WITH AUTO DIFFERENTIAL
Basophils %: 0 % (ref 0.0–2.0)
Basophils Absolute: 0 10*3/uL (ref 0.0–0.2)
Eosinophils %: 0 % — ABNORMAL LOW (ref 0.5–7.8)
Eosinophils Absolute: 0 10*3/uL (ref 0.0–0.8)
Granulocyte Absolute Count: 0.1 10*3/uL (ref 0.0–0.5)
Hematocrit: 44.4 % (ref 41.1–50.3)
Hemoglobin: 14 g/dL (ref 13.6–17.2)
Immature Granulocytes: 1 % (ref 0.0–5.0)
Lymphocytes %: 13 % (ref 13–44)
Lymphocytes Absolute: 1.4 10*3/uL (ref 0.5–4.6)
MCH: 26.3 PG (ref 26.1–32.9)
MCHC: 31.5 g/dL (ref 31.4–35.0)
MCV: 83.3 FL (ref 79.6–97.8)
MPV: 9.9 FL (ref 9.4–12.3)
Monocytes %: 7 % (ref 4.0–12.0)
Monocytes Absolute: 0.8 10*3/uL (ref 0.1–1.3)
NRBC Absolute: 0 10*3/uL (ref 0.0–0.2)
Neutrophils %: 79 % — ABNORMAL HIGH (ref 43–78)
Neutrophils Absolute: 8.5 10*3/uL — ABNORMAL HIGH (ref 1.7–8.2)
Platelet Comment: ADEQUATE
Platelets: 150 10*3/uL (ref 150–450)
RBC: 5.33 M/uL (ref 4.23–5.6)
RDW: 13.1 % (ref 11.9–14.6)
WBC: 10.8 10*3/uL (ref 4.3–11.1)

## 2020-07-18 LAB — CBC WITH AUTOMATED DIFF
ABS. BASOPHILS: 0 10*3/uL (ref 0.0–0.2)
ABS. EOSINOPHILS: 0 10*3/uL (ref 0.0–0.8)
ABS. IMM. GRANS.: 0.1 10*3/uL (ref 0.0–0.5)
ABS. LYMPHOCYTES: 1.4 10*3/uL (ref 0.5–4.6)
ABS. MONOCYTES: 0.8 10*3/uL (ref 0.1–1.3)
ABS. NEUTROPHILS: 8.5 10*3/uL — ABNORMAL HIGH (ref 1.7–8.2)
ABSOLUTE NRBC: 0 10*3/uL (ref 0.0–0.2)
BASOPHILS: 0 % (ref 0.0–2.0)
EOSINOPHILS: 0 % — ABNORMAL LOW (ref 0.5–7.8)
HCT: 44.4 % (ref 41.1–50.3)
HGB: 14 g/dL (ref 13.6–17.2)
IMMATURE GRANULOCYTES: 1 % (ref 0.0–5.0)
LYMPHOCYTES: 13 % (ref 13–44)
MCH: 26.3 PG (ref 26.1–32.9)
MCHC: 31.5 g/dL (ref 31.4–35.0)
MCV: 83.3 FL (ref 79.6–97.8)
MONOCYTES: 7 % (ref 4.0–12.0)
MPV: 9.9 FL (ref 9.4–12.3)
NEUTROPHILS: 79 % — ABNORMAL HIGH (ref 43–78)
PLATELET COMMENTS: ADEQUATE
PLATELET: 150 10*3/uL (ref 150–450)
RBC: 5.33 M/uL (ref 4.23–5.6)
RDW: 13.1 % (ref 11.9–14.6)
WBC: 10.8 10*3/uL (ref 4.3–11.1)

## 2020-07-18 LAB — METABOLIC PANEL, COMPREHENSIVE
A-G Ratio: 1 — ABNORMAL LOW (ref 1.2–3.5)
ALT (SGPT): 44 U/L (ref 12–65)
AST (SGOT): 40 U/L — ABNORMAL HIGH (ref 15–37)
Albumin: 3.9 g/dL (ref 3.2–4.6)
Alk. phosphatase: 76 U/L (ref 50–136)
Anion gap: 7 mmol/L (ref 7–16)
BUN: 20 MG/DL (ref 8–23)
Bilirubin, total: 0.8 MG/DL (ref 0.2–1.1)
CO2: 23 mmol/L (ref 21–32)
Calcium: 9.4 MG/DL (ref 8.3–10.4)
Chloride: 105 mmol/L (ref 98–107)
Creatinine: 1.28 MG/DL (ref 0.8–1.5)
GFR est AA: >60 mL/min/{1.73_m2} (ref >60)
GFR est non-AA: 58 mL/min/{1.73_m2} — ABNORMAL LOW (ref >60)
Globulin: 4 g/dL — ABNORMAL HIGH (ref 2.3–3.5)
Glucose: 142 mg/dL — ABNORMAL HIGH (ref 65–100)
Potassium: 3.8 mmol/L (ref 3.5–5.1)
Protein, total: 7.9 g/dL (ref 6.3–8.2)
Sodium: 135 mmol/L — ABNORMAL LOW (ref 138–145)

## 2020-07-18 MED ORDER — SODIUM CHLORIDE 0.9% BOLUS IV
0.9 % | Freq: Once | INTRAVENOUS | Status: AC
Start: 2020-07-18 — End: 2020-07-18
  Administered 2020-07-18: via INTRAVENOUS

## 2020-07-18 MED ORDER — DIATRIZOATE MEGLUMINE & SODIUM 66 %-10 % ORAL SOLN
66-10 % | Freq: Once | ORAL | Status: AC
Start: 2020-07-18 — End: 2020-07-18
  Administered 2020-07-18: 22:00:00 via ORAL

## 2020-07-18 MED ORDER — LACTATED RINGERS IV
INTRAVENOUS | Status: DC
Start: 2020-07-18 — End: 2020-07-18
  Administered 2020-07-18: 22:00:00 via INTRAVENOUS

## 2020-07-18 MED ORDER — IOPAMIDOL 76 % IV SOLN
76 % | Freq: Once | INTRAVENOUS | Status: AC
Start: 2020-07-18 — End: 2020-07-18
  Administered 2020-07-18: via INTRAVENOUS

## 2020-07-18 MED ORDER — SALINE PERIPHERAL FLUSH PRN
Freq: Once | INTRAMUSCULAR | Status: AC
Start: 2020-07-18 — End: 2020-07-18
  Administered 2020-07-18

## 2020-07-18 MED ORDER — ACETAMINOPHEN 500 MG TAB
500 mg | ORAL | Status: DC
Start: 2020-07-18 — End: 2020-07-18

## 2020-07-18 MED FILL — MD-GASTROVIEW 66 %-10 % ORAL SOLUTION: 66-10 % | ORAL | Qty: 30

## 2020-07-18 MED FILL — TYLENOL EXTRA STRENGTH 500 MG TABLET: 500 mg | ORAL | Qty: 2

## 2020-07-18 NOTE — ED Notes (Signed)
Pt arrived via EMS from home with c/o rectal pain and constipation. Pt reports taking an enema, stool softener with no relief. Pt reports some rectal bleeding  BP 159/74 HR 61 BGL 142

## 2020-07-18 NOTE — ED Provider Notes (Signed)
The history is provided by the patient.   Rectal Pain   This is a new problem. The current episode started 6 to 12 hours ago. The stool is described as blood tinged. Associated symptoms include abdominal pain and constipation. Pertinent negatives include no dysuria, no abdominal distention, no chills, no fever, no nausea, no back pain, no vomiting and no diarrhea. Associated symptoms comments: Rectal pain and unable to have a bowel movement this morning. He has tried enemas for the symptoms. The treatment provided no relief. Past medical history comments: Hypertension, hypercholesterolemia, prostate cancer, constipation.        Past Medical History:   Diagnosis Date   ??? Hypercholesterolemia    ??? Hypertension    ??? Personal history of prostate cancer        Past Surgical History:   Procedure Laterality Date   ??? HX OTHER SURGICAL      brachytherapy 3/04         No family history on file.    Social History     Socioeconomic History   ??? Marital status: MARRIED     Spouse name: Not on file   ??? Number of children: Not on file   ??? Years of education: Not on file   ??? Highest education level: Not on file   Occupational History   ??? Not on file   Tobacco Use   ??? Smoking status: Never Smoker   ??? Smokeless tobacco: Not on file   Substance and Sexual Activity   ??? Alcohol use: No   ??? Drug use: Not on file   ??? Sexual activity: Not on file   Other Topics Concern   ??? Not on file   Social History Narrative   ??? Not on file     Social Determinants of Health     Financial Resource Strain:    ??? Difficulty of Paying Living Expenses: Not on file   Food Insecurity:    ??? Worried About Running Out of Food in the Last Year: Not on file   ??? Ran Out of Food in the Last Year: Not on file   Transportation Needs:    ??? Lack of Transportation (Medical): Not on file   ??? Lack of Transportation (Non-Medical): Not on file   Physical Activity:    ??? Days of Exercise per Week: Not on file   ??? Minutes of Exercise per Session: Not on file   Stress:    ??? Feeling  of Stress : Not on file   Social Connections:    ??? Frequency of Communication with Friends and Family: Not on file   ??? Frequency of Social Gatherings with Friends and Family: Not on file   ??? Attends Religious Services: Not on file   ??? Active Member of Clubs or Organizations: Not on file   ??? Attends Archivist Meetings: Not on file   ??? Marital Status: Not on file   Intimate Partner Violence:    ??? Fear of Current or Ex-Partner: Not on file   ??? Emotionally Abused: Not on file   ??? Physically Abused: Not on file   ??? Sexually Abused: Not on file   Housing Stability:    ??? Unable to Pay for Housing in the Last Year: Not on file   ??? Number of Places Lived in the Last Year: Not on file   ??? Unstable Housing in the Last Year: Not on file         ALLERGIES:  Lisinopril, Meloxicam, Oxaprozin, and Sulfa (sulfonamide antibiotics)    Review of Systems   Constitutional: Negative for chills and fever.   Gastrointestinal: Positive for abdominal pain, anal bleeding, blood in stool, constipation and rectal pain. Negative for abdominal distention, diarrhea, nausea and vomiting.   Genitourinary: Negative for dysuria, frequency and hematuria.   Musculoskeletal: Negative for back pain and myalgias.   All other systems reviewed and are negative.      Vitals:    07/18/20 1628   BP: (!) 148/45   Pulse: 82   Resp: 16   Temp: 98.1 ??F (36.7 ??C)   SpO2: 97%   Weight: 79.4 kg (175 lb)   Height: '5\' 11"'  (1.803 m)            Physical Exam  Vitals and nursing note reviewed.   Constitutional:       Appearance: He is well-developed.   HENT:      Nose: Nose normal.      Mouth/Throat:      Mouth: Mucous membranes are moist.      Pharynx: No oropharyngeal exudate.   Eyes:      Conjunctiva/sclera: Conjunctivae normal.      Pupils: Pupils are equal, round, and reactive to light.   Cardiovascular:      Rate and Rhythm: Normal rate and regular rhythm.      Heart sounds: Normal heart sounds.   Pulmonary:      Effort: Pulmonary effort is normal.       Breath sounds: Normal breath sounds.   Abdominal:      General: Bowel sounds are normal. There is no distension.      Palpations: Abdomen is soft.      Tenderness: There is no abdominal tenderness. There is no guarding or rebound.   Genitourinary:     Comments: I see no hemorrhoids or obvious tears but the patient's anus is is exquisitely tender to palpation, there is rectal impaction present with bloody coated stool, disimpaction performed.  Blood was there prior to disimpaction.  No masses appreciated.  Musculoskeletal:         General: No tenderness. Normal range of motion.      Cervical back: Neck supple.   Lymphadenopathy:      Cervical: No cervical adenopathy.   Skin:     General: Skin is warm and dry.   Neurological:      Mental Status: He is alert and oriented to person, place, and time.          MDM  Number of Diagnoses or Management Options  Diagnosis management comments: Patient stopped taking stool softener couple of weeks ago due to increased output.  He reports having a normal bowel movement yesterday but suddenly having pain and problems today.  He is very tender on rectal exam.  I am ordering a CT scan to evaluate for colitis and proctitis.  Disimpaction removed-stool that had some bright red blood coating it.  Blood was evident prior to disimpaction.    Care turned over to me pending CT scan.  This was obtained and shows thickening of the rectum with some inflammatory stranding suggestive of proctitis.  There is also a fluid-filled and mildly dilated colon with abrupt caliber transition in focal wall thickening distal sigmoid colon suspicious for possible cancer.  Discussed with GI who will get him in the office in the next day or 2 to arrange colonoscopy.  Will place on Cipro and Flagyl for proctitis.  Amount and/or Complexity of Data Reviewed  Clinical lab tests: ordered and reviewed (Results for orders placed or performed during the hospital encounter of 07/18/20  -CBC WITH AUTOMATED DIFF:         Result                      Value             Ref Range           WBC                         10.8              4.3 - 11.1 K*       RBC                         5.33              4.23 - 5.6 M*       HGB                         14.0              13.6 - 17.2 *       HCT                         44.4              41.1 - 50.3 %       MCV                         83.3              79.6 - 97.8 *       MCH                         26.3              26.1 - 32.9 *       MCHC                        31.5              31.4 - 35.0 *       RDW                         13.1              11.9 - 14.6 %       PLATELET                    150               150 - 450 K/*       MPV                         9.9               9.4 - 12.3 FL       ABSOLUTE NRBC               0.00  0.0 - 0.2 K/*       NEUTROPHILS                 79 (H)            43 - 78 %           LYMPHOCYTES                 13                13 - 44 %           MONOCYTES                   7                 4.0 - 12.0 %        EOSINOPHILS                 0 (L)             0.5 - 7.8 %         BASOPHILS                   0                 0.0 - 2.0 %         IMMATURE GRANULOCYTES       1                 0.0 - 5.0 %         ABS. NEUTROPHILS            8.5 (H)           1.7 - 8.2 K/*       ABS. LYMPHOCYTES            1.4               0.5 - 4.6 K/*       ABS. MONOCYTES              0.8               0.1 - 1.3 K/*       ABS. EOSINOPHILS            0.0               0.0 - 0.8 K/*       ABS. BASOPHILS              0.0               0.0 - 0.2 K/*       ABS. IMM. GRANS.            0.1               0.0 - 0.5 K/*       RBC COMMENTS                                                  OCCASIONAL   ANISOCYTOSIS + POIKILOCYTOSIS          WBC COMMENTS  Result Confirmed By Smear       PLATELET COMMENTS           ADEQUATE                              DF                          AUTOMATED                        -METABOLIC PANEL,  COMPREHENSIVE:        Result                      Value             Ref Range           Sodium                      135 (L)           138 - 145 mm*       Potassium                   3.8               3.5 - 5.1 mm*       Chloride                    105               98 - 107 mmo*       CO2                         23                21 - 32 mmol*       Anion gap                   7                 7 - 16 mmol/L       Glucose                     142 (H)           65 - 100 mg/*       BUN                         20                8 - 23 MG/DL        Creatinine                  1.28              0.8 - 1.5 MG*       GFR est AA                  >60               >60 ml/min/1*       GFR est non-AA              58 (L)            >  60 ml/min/1*       Calcium                     9.4               8.3 - 10.4 M*       Bilirubin, total            0.8               0.2 - 1.1 MG*       ALT (SGPT)                  44                12 - 65 U/L         AST (SGOT)                  40 (H)            15 - 37 U/L         Alk. phosphatase            76                50 - 136 U/L        Protein, total              7.9               6.3 - 8.2 g/*       Albumin                     3.9               3.2 - 4.6 g/*       Globulin                    4.0 (H)           2.3 - 3.5 g/*       A-G Ratio                   1.0 (L)           1.2 - 3.5      )  Tests in the radiology section of CPT??: ordered    Risk of Complications, Morbidity, and/or Mortality  Presenting problems: moderate  Diagnostic procedures: low  Management options: low    Patient Progress  Patient progress: stable         Procedures

## 2020-07-18 NOTE — ED Notes (Signed)
 I have reviewed discharge instructions with the caregiver.  The patient verbalized understanding.    Patient left ED via Discharge Method: ambulatory to Home with wife    Opportunity for questions and clarification provided.       Patient given 2 scripts.         To continue your aftercare when you leave the hospital, you may receive an automated call from our care team to check in on how you are doing. This is a free service and part of our promise to provide the best care and service to meet your aftercare needs. If you have questions, or wish to unsubscribe from this service please call [phone]. Thank you for Choosing our [facility] Emergency Department.

## 2020-07-18 NOTE — ED Notes (Signed)
At time of discharge, pt had one large bowel movement by bedpan, blood visible in stool.     When standing up, pt had another very large bowel movement onto the floor.     Before complete discharge, pt sat on bedside commode for 30 minutes until he was sure he was done passing stool/

## 2020-07-19 MED ORDER — CIPROFLOXACIN 500 MG TAB
500 mg | ORAL_TABLET | Freq: Two times a day (BID) | ORAL | 0 refills | Status: AC
Start: 2020-07-19 — End: 2020-07-25

## 2020-07-19 MED ORDER — METRONIDAZOLE 500 MG TAB
500 mg | ORAL_TABLET | Freq: Two times a day (BID) | ORAL | 0 refills | Status: AC
Start: 2020-07-19 — End: 2020-07-25

## 2020-07-21 DIAGNOSIS — J384 Edema of larynx: Secondary | ICD-10-CM | POA: Diagnosis not present

## 2020-07-21 DIAGNOSIS — J0101 Acute recurrent maxillary sinusitis: Secondary | ICD-10-CM | POA: Diagnosis not present

## 2020-07-23 ENCOUNTER — Other Ambulatory Visit: Admit: 2020-07-23 | Discharge: 2020-07-23 | Payer: MEDICARE | Attending: Adult Health | Primary: Internal Medicine

## 2020-07-23 DIAGNOSIS — R300 Dysuria: Secondary | ICD-10-CM

## 2020-07-23 LAB — AMB POC URINALYSIS DIP STICK AUTO W/O MICRO (PGU)
Blood (UA POC): NEGATIVE
Blood (UA POC): NEGATIVE
Glucose (UA POC): NEGATIVE mg/dL
Glucose, Urine, POC: NEGATIVE mg/dL
Leukocyte Esterase, Urine, POC: NEGATIVE
Leukocyte esterase (UA POC): NEGATIVE
Nitrite, Urine, POC: NEGATIVE
Nitrites (UA POC): NEGATIVE
Protein (UA POC): NEGATIVE
Protein, Urine, POC: NEGATIVE
Specific Gravity, Urine, POC: 1.03 NA (ref 1.001–1.035)
Specific gravity (UA POC): 1.03 (ref 1.001–1.035)
Urobilinogen (POC): 1
Urobilinogen, POC: 1
pH (UA POC): 5.5 (ref 4.6–8.0)
pH, Urine, POC: 5.5 NA (ref 4.6–8.0)

## 2020-07-23 NOTE — Progress Notes (Signed)
Reason for collection: urine check   Patient #: [phone]  Pharmacy: [facility] [address]      UA - Dipstick  Results for orders placed or performed in visit on 07/23/20   AMB POC URINALYSIS DIP STICK AUTO W/O MICRO (PGU)     Status: None   Result Value Ref Range Status    Glucose (UA POC) Negative Negative mg/dL Final    Bilirubin (UA POC) Small Negative Final    Ketones (UA POC) Trace Negative Final    Specific gravity (UA POC) 1.030 1.001 - 1.035 Final    Blood (UA POC) Negative Negative Final    pH (UA POC) 5.5 4.6 - 8.0 Final    Protein (UA POC) Negative Negative Final    Urobilinogen (POC) 1 mg/dL  Final    Nitrites (UA POC) Negative Negative Final    Leukocyte esterase (UA POC) Negative Negative Final   Results for orders placed or performed in visit on 07/20/17   AMB POC URINALYSIS DIP STICK AUTO W/ MICRO (PGU)     Status: None   Result Value Ref Range Status    Glucose (UA POC) Negative Negative mg/dL Final    Bilirubin (UA POC) Negative Negative Final    Ketones (UA POC) Negative Negative Final    Specific gravity (UA POC) 1.020 1.001 - 1.035 Final    Blood (UA POC) Negative Negative Final    pH (UA POC) 7 4.6 - 8.0 Final    Protein (UA POC) Negative Negative Final    Urobilinogen (POC) 0.2 mg/dL  Final    Nitrites (UA POC) Negative Negative Final    Leukocyte esterase (UA POC) Negative Negative Final       UA - Micro  WBC - 0  RBC - 0  Bacteria - 0  Epith - 0      Requested Prescriptions      No prescriptions requested or ordered in this encounter     Plan     ICD-10-CM ICD-9-CM    1. Dysuria  R30.0 788.1 AMB POC URINALYSIS DIP STICK AUTO W/O MICRO (PGU)      CULTURE, URINE         PLAN:  Urine is clear

## 2020-07-23 NOTE — Progress Notes (Signed)
Urine culture is negative.

## 2020-07-25 LAB — CULTURE, URINE
Urine Culture, Routine: NO GROWTH
Urine Culture, Routine: NO GROWTH

## 2020-07-27 DIAGNOSIS — J320 Chronic maxillary sinusitis: Secondary | ICD-10-CM | POA: Diagnosis not present

## 2020-08-04 DIAGNOSIS — I10 Essential (primary) hypertension: Secondary | ICD-10-CM | POA: Diagnosis not present

## 2020-08-04 DIAGNOSIS — Z0001 Encounter for general adult medical examination with abnormal findings: Secondary | ICD-10-CM | POA: Diagnosis not present

## 2020-08-04 DIAGNOSIS — R7303 Prediabetes: Secondary | ICD-10-CM | POA: Diagnosis not present

## 2020-08-12 DIAGNOSIS — N400 Enlarged prostate without lower urinary tract symptoms: Secondary | ICD-10-CM | POA: Diagnosis not present

## 2020-08-12 DIAGNOSIS — D696 Thrombocytopenia, unspecified: Secondary | ICD-10-CM | POA: Diagnosis not present

## 2020-08-12 DIAGNOSIS — Z0001 Encounter for general adult medical examination with abnormal findings: Secondary | ICD-10-CM | POA: Diagnosis not present

## 2020-08-12 DIAGNOSIS — I10 Essential (primary) hypertension: Secondary | ICD-10-CM | POA: Diagnosis not present

## 2020-08-12 DIAGNOSIS — Z0000 Encounter for general adult medical examination without abnormal findings: Secondary | ICD-10-CM | POA: Diagnosis not present

## 2020-08-12 DIAGNOSIS — K219 Gastro-esophageal reflux disease without esophagitis: Secondary | ICD-10-CM | POA: Diagnosis not present

## 2020-08-12 DIAGNOSIS — R7303 Prediabetes: Secondary | ICD-10-CM | POA: Diagnosis not present

## 2020-08-12 DIAGNOSIS — I480 Paroxysmal atrial fibrillation: Secondary | ICD-10-CM | POA: Diagnosis not present

## 2020-08-12 DIAGNOSIS — G4733 Obstructive sleep apnea (adult) (pediatric): Secondary | ICD-10-CM | POA: Diagnosis not present

## 2020-08-16 DIAGNOSIS — M9901 Segmental and somatic dysfunction of cervical region: Secondary | ICD-10-CM | POA: Diagnosis not present

## 2020-08-16 DIAGNOSIS — R519 Headache, unspecified: Secondary | ICD-10-CM | POA: Diagnosis not present

## 2020-08-16 DIAGNOSIS — M5033 Other cervical disc degeneration, cervicothoracic region: Secondary | ICD-10-CM | POA: Diagnosis not present

## 2020-08-16 DIAGNOSIS — M9902 Segmental and somatic dysfunction of thoracic region: Secondary | ICD-10-CM | POA: Diagnosis not present

## 2020-08-18 DIAGNOSIS — M9901 Segmental and somatic dysfunction of cervical region: Secondary | ICD-10-CM | POA: Diagnosis not present

## 2020-08-18 DIAGNOSIS — M5033 Other cervical disc degeneration, cervicothoracic region: Secondary | ICD-10-CM | POA: Diagnosis not present

## 2020-08-18 DIAGNOSIS — R519 Headache, unspecified: Secondary | ICD-10-CM | POA: Diagnosis not present

## 2020-08-18 DIAGNOSIS — M9902 Segmental and somatic dysfunction of thoracic region: Secondary | ICD-10-CM | POA: Diagnosis not present

## 2020-08-20 DIAGNOSIS — R519 Headache, unspecified: Secondary | ICD-10-CM | POA: Diagnosis not present

## 2020-08-20 DIAGNOSIS — M5033 Other cervical disc degeneration, cervicothoracic region: Secondary | ICD-10-CM | POA: Diagnosis not present

## 2020-08-20 DIAGNOSIS — M9902 Segmental and somatic dysfunction of thoracic region: Secondary | ICD-10-CM | POA: Diagnosis not present

## 2020-08-20 DIAGNOSIS — M9901 Segmental and somatic dysfunction of cervical region: Secondary | ICD-10-CM | POA: Diagnosis not present

## 2020-08-23 DIAGNOSIS — M5033 Other cervical disc degeneration, cervicothoracic region: Secondary | ICD-10-CM | POA: Diagnosis not present

## 2020-08-23 DIAGNOSIS — R519 Headache, unspecified: Secondary | ICD-10-CM | POA: Diagnosis not present

## 2020-08-23 DIAGNOSIS — M9902 Segmental and somatic dysfunction of thoracic region: Secondary | ICD-10-CM | POA: Diagnosis not present

## 2020-08-23 DIAGNOSIS — M9901 Segmental and somatic dysfunction of cervical region: Secondary | ICD-10-CM | POA: Diagnosis not present

## 2020-08-24 DIAGNOSIS — J320 Chronic maxillary sinusitis: Secondary | ICD-10-CM | POA: Diagnosis not present

## 2020-08-24 DIAGNOSIS — J342 Deviated nasal septum: Secondary | ICD-10-CM | POA: Diagnosis not present

## 2020-08-24 DIAGNOSIS — J301 Allergic rhinitis due to pollen: Secondary | ICD-10-CM | POA: Diagnosis not present

## 2020-08-25 DIAGNOSIS — M5033 Other cervical disc degeneration, cervicothoracic region: Secondary | ICD-10-CM | POA: Diagnosis not present

## 2020-08-25 DIAGNOSIS — R519 Headache, unspecified: Secondary | ICD-10-CM | POA: Diagnosis not present

## 2020-08-25 DIAGNOSIS — M9902 Segmental and somatic dysfunction of thoracic region: Secondary | ICD-10-CM | POA: Diagnosis not present

## 2020-08-25 DIAGNOSIS — M9901 Segmental and somatic dysfunction of cervical region: Secondary | ICD-10-CM | POA: Diagnosis not present

## 2020-08-26 DIAGNOSIS — G4710 Hypersomnia, unspecified: Secondary | ICD-10-CM | POA: Diagnosis not present

## 2020-08-26 DIAGNOSIS — G4733 Obstructive sleep apnea (adult) (pediatric): Secondary | ICD-10-CM | POA: Diagnosis not present

## 2020-08-26 DIAGNOSIS — J310 Chronic rhinitis: Secondary | ICD-10-CM | POA: Diagnosis not present

## 2020-08-26 DIAGNOSIS — K219 Gastro-esophageal reflux disease without esophagitis: Secondary | ICD-10-CM | POA: Diagnosis not present

## 2020-08-27 DIAGNOSIS — M9902 Segmental and somatic dysfunction of thoracic region: Secondary | ICD-10-CM | POA: Diagnosis not present

## 2020-08-27 DIAGNOSIS — M5033 Other cervical disc degeneration, cervicothoracic region: Secondary | ICD-10-CM | POA: Diagnosis not present

## 2020-08-27 DIAGNOSIS — M9901 Segmental and somatic dysfunction of cervical region: Secondary | ICD-10-CM | POA: Diagnosis not present

## 2020-08-27 DIAGNOSIS — R519 Headache, unspecified: Secondary | ICD-10-CM | POA: Diagnosis not present

## 2020-09-01 DIAGNOSIS — M9902 Segmental and somatic dysfunction of thoracic region: Secondary | ICD-10-CM | POA: Diagnosis not present

## 2020-09-01 DIAGNOSIS — M9901 Segmental and somatic dysfunction of cervical region: Secondary | ICD-10-CM | POA: Diagnosis not present

## 2020-09-01 DIAGNOSIS — R519 Headache, unspecified: Secondary | ICD-10-CM | POA: Diagnosis not present

## 2020-09-01 DIAGNOSIS — M5033 Other cervical disc degeneration, cervicothoracic region: Secondary | ICD-10-CM | POA: Diagnosis not present

## 2020-09-07 DIAGNOSIS — M5033 Other cervical disc degeneration, cervicothoracic region: Secondary | ICD-10-CM | POA: Diagnosis not present

## 2020-09-07 DIAGNOSIS — R519 Headache, unspecified: Secondary | ICD-10-CM | POA: Diagnosis not present

## 2020-09-07 DIAGNOSIS — M9902 Segmental and somatic dysfunction of thoracic region: Secondary | ICD-10-CM | POA: Diagnosis not present

## 2020-09-07 DIAGNOSIS — M9901 Segmental and somatic dysfunction of cervical region: Secondary | ICD-10-CM | POA: Diagnosis not present

## 2020-09-14 DIAGNOSIS — R519 Headache, unspecified: Secondary | ICD-10-CM | POA: Diagnosis not present

## 2020-09-14 DIAGNOSIS — M9902 Segmental and somatic dysfunction of thoracic region: Secondary | ICD-10-CM | POA: Diagnosis not present

## 2020-09-14 DIAGNOSIS — M9901 Segmental and somatic dysfunction of cervical region: Secondary | ICD-10-CM | POA: Diagnosis not present

## 2020-09-14 DIAGNOSIS — M5033 Other cervical disc degeneration, cervicothoracic region: Secondary | ICD-10-CM | POA: Diagnosis not present

## 2020-09-16 ENCOUNTER — Other Ambulatory Visit: Admit: 2020-09-16 | Discharge: 2020-09-16 | Payer: MEDICARE | Primary: Internal Medicine

## 2020-09-16 DIAGNOSIS — C61 Malignant neoplasm of prostate: Secondary | ICD-10-CM

## 2020-09-16 NOTE — Progress Notes (Signed)
Jason Fernandez is a 75 y.o. male had labs drawn today for labs per provider at [address] [place] [address].  The patient was drawn at  09:47am In the  left arm and the patient tolerated the procedure fine with no issues.       Samuella Bruin

## 2020-09-17 ENCOUNTER — Encounter: Primary: Internal Medicine

## 2020-09-17 LAB — PSA, ULTRASENSITIVE
PSA, ULTRASENSITIVE, 140735: 0.094 ng/mL (ref 0.000–4.000)
PSA, ULTRASENSITIVE: 0.094 ng/mL (ref 0.000–4.000)

## 2020-09-24 ENCOUNTER — Ambulatory Visit: Attending: Urology | Primary: Internal Medicine

## 2020-09-24 ENCOUNTER — Ambulatory Visit: Admit: 2020-09-24 | Discharge: 2020-09-24 | Payer: MEDICARE | Attending: Urology | Primary: Internal Medicine

## 2020-09-24 DIAGNOSIS — C61 Malignant neoplasm of prostate: Secondary | ICD-10-CM

## 2020-09-24 LAB — AMB POC URINALYSIS DIP STICK AUTO W/O MICRO (PGU)
Bilirubin (UA POC): NEGATIVE
Bilirubin, Urine, POC: NEGATIVE
Glucose (UA POC): NEGATIVE mg/dL
Glucose, Urine, POC: NEGATIVE mg/dL
KETONES, Urine, POC: NEGATIVE
Ketones (UA POC): NEGATIVE
Leukocyte Esterase, Urine, POC: NEGATIVE
Leukocyte esterase (UA POC): NEGATIVE
Nitrite, Urine, POC: NEGATIVE
Nitrites (UA POC): NEGATIVE
Protein (UA POC): NEGATIVE
Protein, Urine, POC: NEGATIVE
Specific Gravity, Urine, POC: 1.03 NA (ref 1.001–1.035)
Specific gravity (UA POC): 1.03 (ref 1.001–1.035)
Urobilinogen (POC): 0.2
Urobilinogen, POC: 0.2
pH (UA POC): 5.5 (ref 4.6–8.0)
pH, Urine, POC: 5.5 NA (ref 4.6–8.0)

## 2020-09-24 NOTE — Progress Notes (Signed)
[facility] [place] [address]  [phone]    Jason Fernandez  DOB: [birth year]     HPI   75 y.o., male returns in follow up for CaP and BPH. ??Reports min LUTS and is no longer on Flomax or Proscar. ??Stream slow at times with mild daytime freq. 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  PSA remains stable at 0.094 on 09/16/20.  Denies any hematuria. ??PVR was 19cc at??prior??visit.  ??      Past Medical History:   Diagnosis Date   ??? Hypercholesterolemia    ??? Hypertension    ??? Personal history of prostate cancer      Past Surgical History:   Procedure Laterality Date   ??? HX OTHER SURGICAL      brachytherapy 3/04     Current Outpatient Medications   Medication Sig Dispense Refill   ??? doxycycline (MONODOX) 100 mg capsule Take 1 Capsule by mouth two (2) times a day. 20 Capsule 0   ??? donepeziL (ARICEPT) 5 mg tablet Take  by mouth nightly.     ??? aspirin delayed-release 81 mg tablet Take 4 Tabs by mouth daily. 30 Tab 1   ??? metFORMIN ER (GLUCOPHAGE XR) 500 mg tablet      ??? tamsulosin (FLOMAX) 0.4 mg capsule      ??? Cholecalciferol, Vitamin D3, (VITAMIN D3) 2,000 unit cap capsule Take  by mouth two (2) times a day.     ??? finasteride (PROSCAR) 5 mg tablet Take 5 mg by mouth daily. (Patient not taking: Reported on 09/24/2020)       Allergies   Allergen Reactions   ??? Lisinopril Hives   ??? Meloxicam Hives   ??? Oxaprozin Rash   ??? Sulfa (Sulfonamide Antibiotics) Rash     Social History     Socioeconomic History   ??? Marital status: MARRIED     Spouse name: Not on file   ??? Number of children: Not on file   ??? Years of education: Not on file   ??? Highest education level: Not on file   Occupational History   ??? Not on file   Tobacco Use   ??? Smoking status: Never Smoker   ??? Smokeless tobacco: Not on file   Substance and Sexual Activity   ??? Alcohol use: No   ??? Drug use: Not on file   ??? Sexual activity: Not on file   Other Topics Concern   ??? Not on file   Social  History Narrative   ??? Not on file     Social Determinants of Health     Financial Resource Strain:    ??? Difficulty of Paying Living Expenses: Not on file   Food Insecurity:    ??? Worried About Running Out of Food in the Last Year: Not on file   ??? Ran Out of Food in the Last Year: Not on file   Transportation Needs:    ??? Lack of Transportation (Medical): Not on file   ??? Lack of Transportation (Non-Medical): Not on file   Physical Activity:    ??? Days of Exercise per Week: Not on file   ??? Minutes of Exercise per Session: Not on file   Stress:    ??? Feeling of Stress : Not on file   Social Connections:    ??? Frequency of Communication with Friends and Family: Not on file   ??? Frequency of Social Gatherings with Friends  and Family: Not on file   ??? Attends Religious Services: Not on file   ??? Active Member of Clubs or Organizations: Not on file   ??? Attends Archivist Meetings: Not on file   ??? Marital Status: Not on file   Intimate Partner Violence:    ??? Fear of Current or Ex-Partner: Not on file   ??? Emotionally Abused: Not on file   ??? Physically Abused: Not on file   ??? Sexually Abused: Not on file   Housing Stability:    ??? Unable to Pay for Housing in the Last Year: Not on file   ??? Number of Places Lived in the Last Year: Not on file   ??? Unstable Housing in the Last Year: Not on file     No family history on file.    Review of Systems  All systems reviewed and are negative at this time.    Physical Exam  There were no vitals taken for this visit.  General appearance - alert, well appearing, and in no distress  Mental status - alert, oriented to person, place, and time  Eyes - extraocular eye movements intact, sclera anicteric  Abdomen - soft, nontender, nondistended, no masses or organomegaly  Neurological -  normal speech, no focal findings or movement disorder noted  Skin - normal coloration and turgor      Urinalysis  UA - Dipstick  Results for orders placed or performed in visit on 09/24/20   AMB POC URINALYSIS  DIP STICK AUTO W/O MICRO (PGU)     Status: None   Result Value Ref Range Status    Glucose (UA POC) Negative Negative mg/dL Final    Bilirubin (UA POC) Negative Negative Final    Ketones (UA POC) Negative Negative Final    Specific gravity (UA POC) 1.030 1.001 - 1.035 Final    Blood (UA POC) Small Negative Final    pH (UA POC) 5.5 4.6 - 8.0 Final    Protein (UA POC) Negative Negative Final    Urobilinogen (POC) 0.2 mg/dL  Final    Nitrites (UA POC) Negative Negative Final    Leukocyte esterase (UA POC) Negative Negative Final   Results for orders placed or performed in visit on 07/20/17   AMB POC URINALYSIS DIP STICK AUTO W/ MICRO (PGU)     Status: None   Result Value Ref Range Status    Glucose (UA POC) Negative Negative mg/dL Final    Bilirubin (UA POC) Negative Negative Final    Ketones (UA POC) Negative Negative Final    Specific gravity (UA POC) 1.020 1.001 - 1.035 Final    Blood (UA POC) Negative Negative Final    pH (UA POC) 7 4.6 - 8.0 Final    Protein (UA POC) Negative Negative Final    Urobilinogen (POC) 0.2 mg/dL  Final    Nitrites (UA POC) Negative Negative Final    Leukocyte esterase (UA POC) Negative Negative Final       UA - Micro  WBC - 0  RBC - 0  Bacteria - 0  Epith - 0    Assessment/Plan    ICD-10-CM ICD-9-CM    1. Prostate cancer ([facility])  C61 185 PSA, ULTRASENSITIVE   2. Benign prostatic hyperplasia, unspecified whether lower urinary tract symptoms present  N40.0 600.00 AMB POC URINALYSIS DIP STICK AUTO W/O MICRO (PGU)     RTO in 12 mo with PSA prior.    Jason Booze Shelle Galdamez, DO

## 2020-09-28 DIAGNOSIS — M9902 Segmental and somatic dysfunction of thoracic region: Secondary | ICD-10-CM | POA: Diagnosis not present

## 2020-09-28 DIAGNOSIS — M5033 Other cervical disc degeneration, cervicothoracic region: Secondary | ICD-10-CM | POA: Diagnosis not present

## 2020-09-28 DIAGNOSIS — R519 Headache, unspecified: Secondary | ICD-10-CM | POA: Diagnosis not present

## 2020-09-28 DIAGNOSIS — M9901 Segmental and somatic dysfunction of cervical region: Secondary | ICD-10-CM | POA: Diagnosis not present

## 2020-09-30 DIAGNOSIS — J301 Allergic rhinitis due to pollen: Secondary | ICD-10-CM | POA: Diagnosis not present

## 2020-10-04 DIAGNOSIS — E0520 Thyrotoxicosis with toxic multinodular goiter without thyrotoxic crisis or storm: Secondary | ICD-10-CM | POA: Diagnosis not present

## 2020-10-04 DIAGNOSIS — N400 Enlarged prostate without lower urinary tract symptoms: Secondary | ICD-10-CM | POA: Diagnosis not present

## 2020-10-04 DIAGNOSIS — Z125 Encounter for screening for malignant neoplasm of prostate: Secondary | ICD-10-CM | POA: Diagnosis not present

## 2020-10-06 DIAGNOSIS — J301 Allergic rhinitis due to pollen: Secondary | ICD-10-CM | POA: Diagnosis not present

## 2020-10-11 IMAGING — CT CT ABDOMEN AND PELVIS WITHOUT CONTRAST
2 of 4 series · 16 of 46 positions shown, 18 images · non-contrast
Comparison: January 16, 2017

CLINICAL DATA: Abdominal pain with diverticulitis suspected.

EXAM:
CT ABDOMEN AND PELVIS WITHOUT CONTRAST
TECHNIQUE: Multidetector CT imaging of the abdomen and pelvis was performed
following the standard protocol without IV contrast.

[Series 2: routine abd/pel wo · axial · 0.78mm/px · z∈[-424,-9]mm · 13 of 91 slices shown, 15 images]
[im 4/91  soft-tissue]
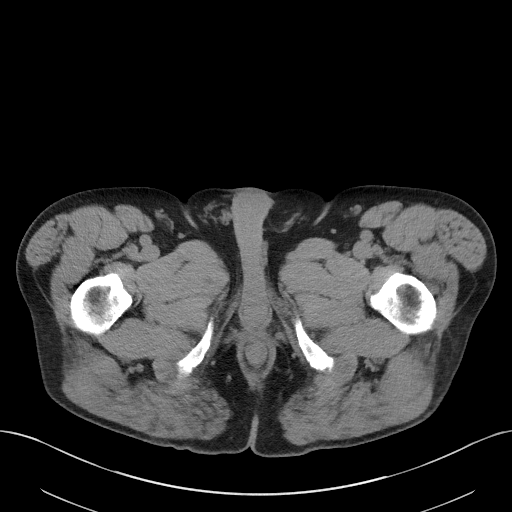
[im 4/91  bone]
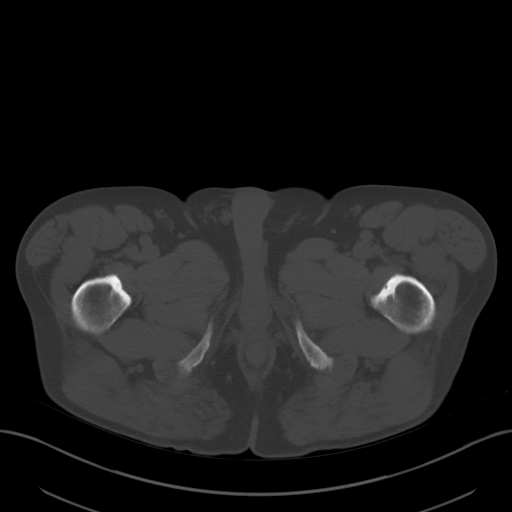
[im 11/91  soft-tissue]
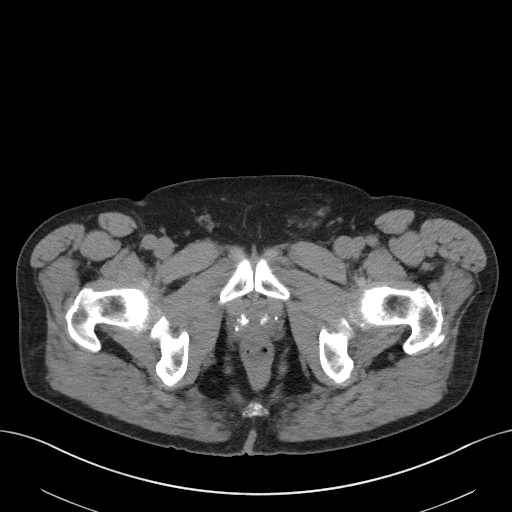
[im 19/91  soft-tissue]
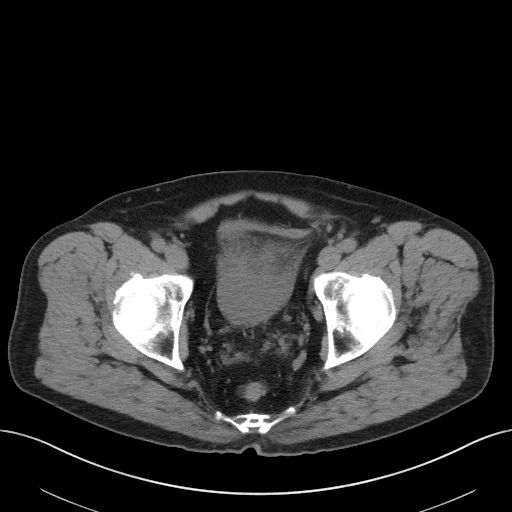
[im 26/91  soft-tissue]
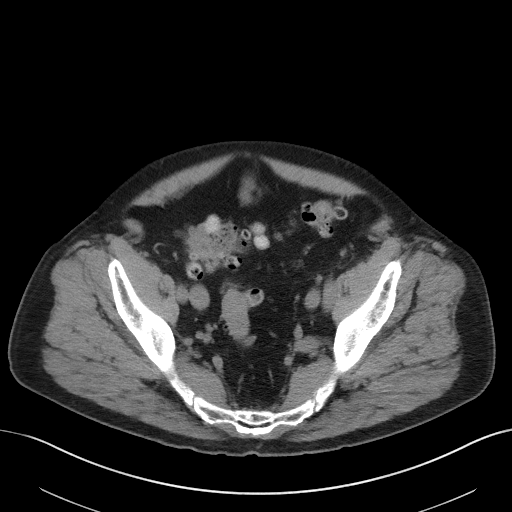
[im 33/91  soft-tissue]
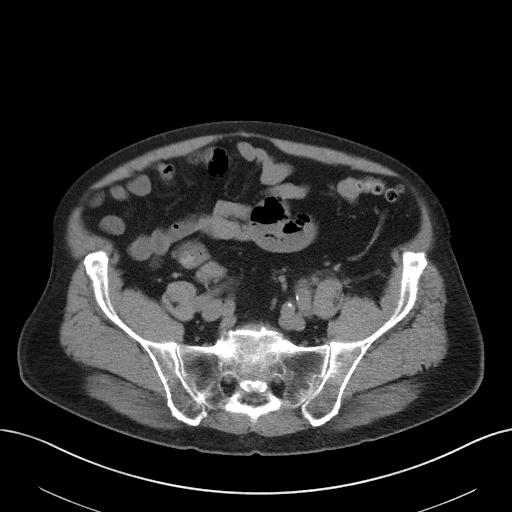
[im 40/91  soft-tissue]
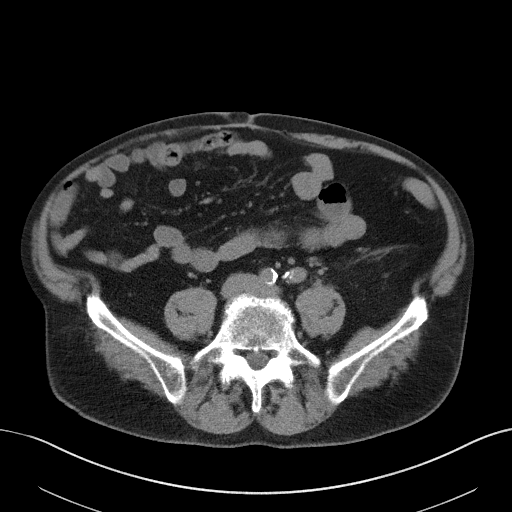
[im 47/91  soft-tissue]
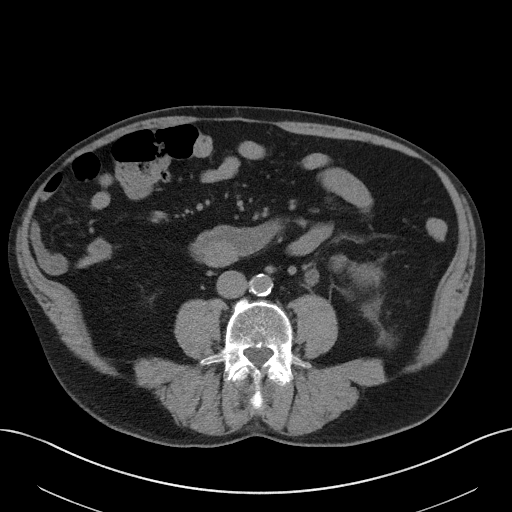
[im 51/91  soft-tissue]
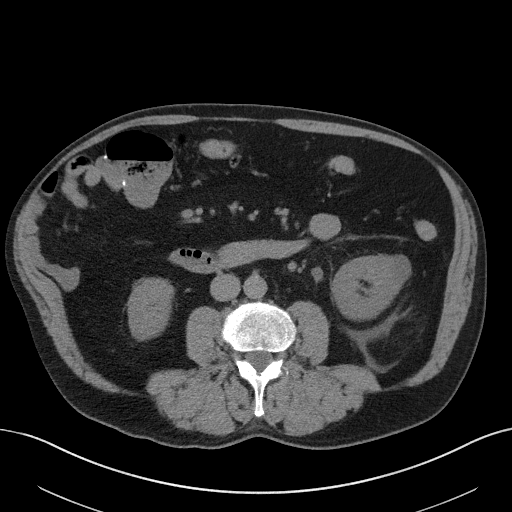
[im 58/91  soft-tissue]
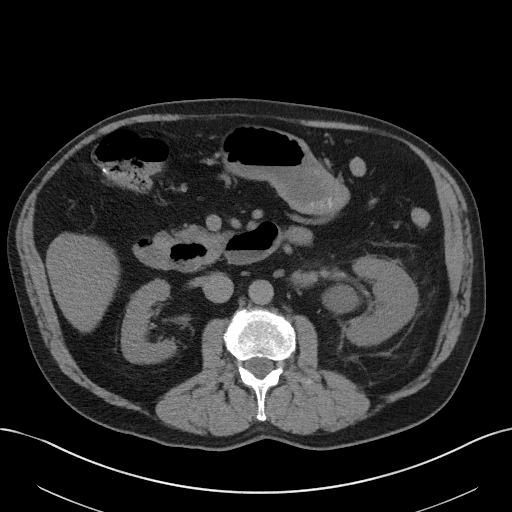
[im 58/91  bone]
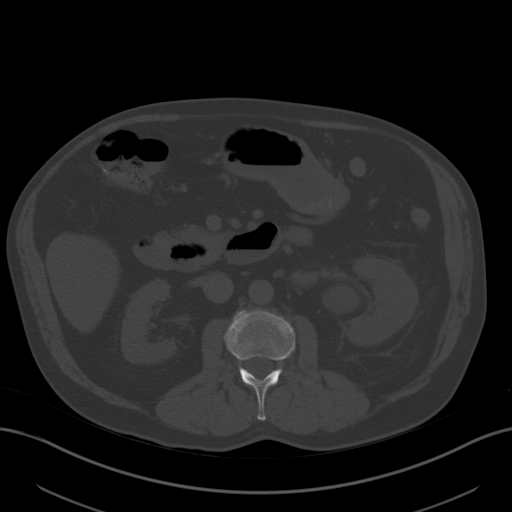
[im 65/91  soft-tissue]
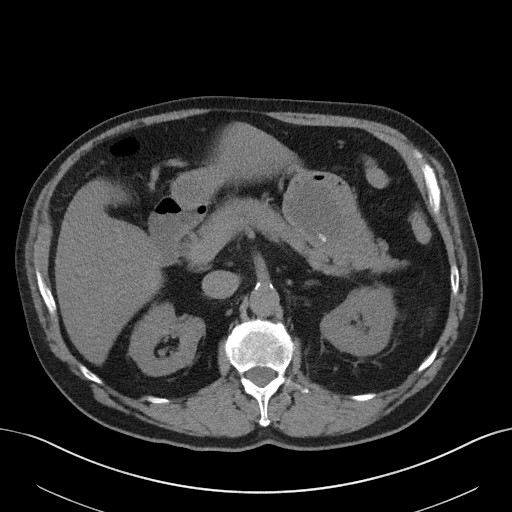
[im 73/91  soft-tissue]
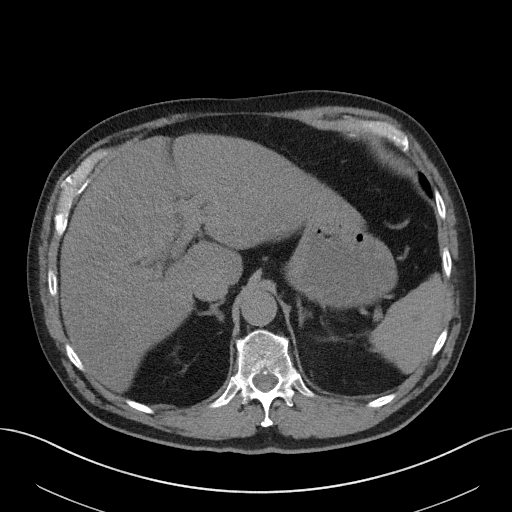
[im 80/91  soft-tissue]
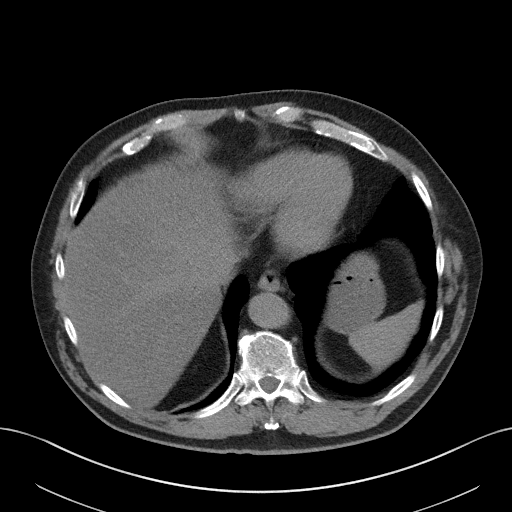
[im 87/91  soft-tissue]
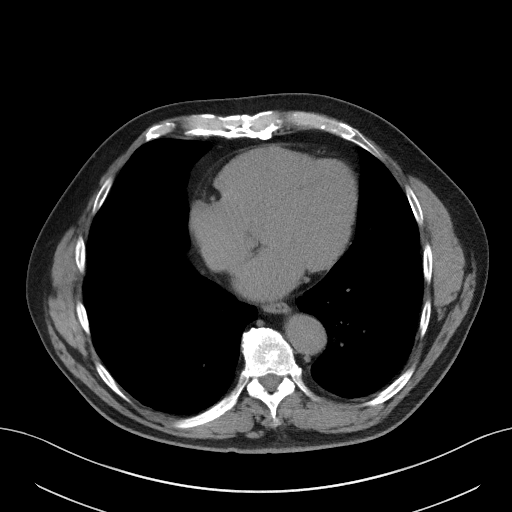

[Series 5: coronal st · coronal · 0.77mm/px · 3 of 97 slices shown]
[im 33/97  soft-tissue]
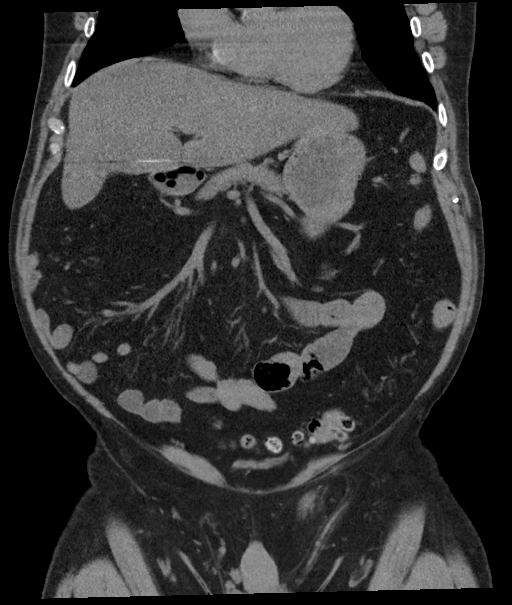
[im 43/97  soft-tissue]
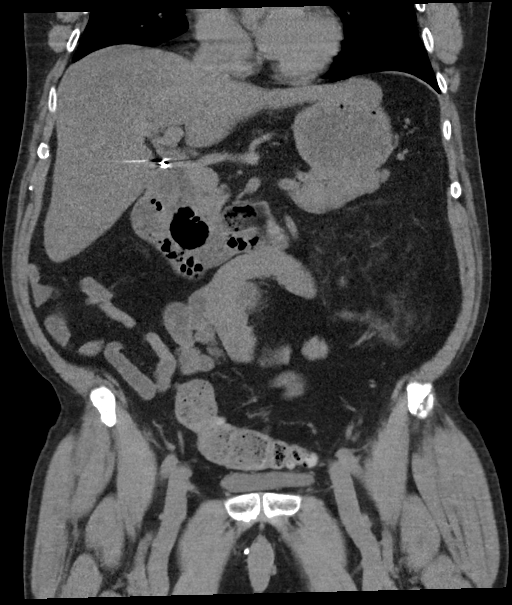
[im 54/97  soft-tissue]
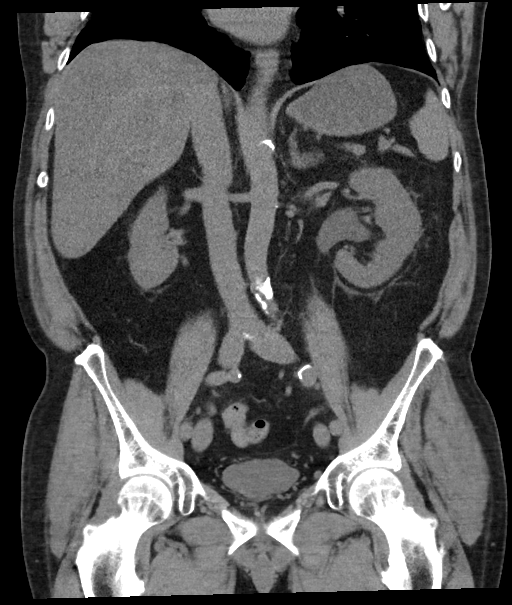

[16 of 46 positions shown; findings below may reference images not displayed]

FINDINGS: Lower chest: The lung bases are clear. The heart size is normal.

Hepatobiliary: There is decreased hepatic attenuation suggestive of
hepatic steatosis. Status post cholecystectomy.There is no biliary
ductal dilation.

Pancreas: Normal contours without ductal dilatation. No
peripancreatic fluid collection.

Spleen: No splenic laceration or hematoma.

Adrenals/Urinary Tract:

--Adrenal glands: No adrenal hemorrhage.

--Right kidney/ureter: There is a punctate nonobstructing stone in
the interpolar region of the right kidney.

--Left kidney/ureter: There is moderate left-sided
hydroureteronephrosis secondary to an obstructing 6-7 mm stone in
the mid left ureter. Left-sided renal cysts are noted, some of which
are somewhat hyperdense.

--Urinary bladder: Unremarkable.

Stomach/Bowel:

--Stomach/Duodenum: Duodenal diverticula are noted. The stomach is
unremarkable.

--Small bowel: No dilatation or inflammation.

--Colon: Rectosigmoid diverticulosis without acute inflammation. The
patient appears to be status post right hemicolectomy.

--Appendix: Surgically absent.

Vascular/Lymphatic: Atherosclerotic calcification is present within
the non-aneurysmal abdominal aorta, without hemodynamically
significant stenosis.

--No retroperitoneal lymphadenopathy.

--No mesenteric lymphadenopathy.

--No pelvic or inguinal lymphadenopathy.

Reproductive: Unremarkable

Other: No ascites or free air. There are bilateral fat containing
inguinal hernias, left greater than right. The left inguinal hernia
contains a small portion of the bladder dome.

Musculoskeletal. No acute displaced fractures.
IMPRESSION: 1. Moderate left-sided hydroureteronephrosis secondary to the 7 mm
obstructing stone in the mid left ureter.
2. Hepatic steatosis.
3. Status post cholecystectomy.
4. Diverticulosis without CT evidence for diverticulitis. The
patient is status post right hemicolectomy.

## 2020-10-12 DIAGNOSIS — J301 Allergic rhinitis due to pollen: Secondary | ICD-10-CM | POA: Diagnosis not present

## 2020-10-12 DIAGNOSIS — M5033 Other cervical disc degeneration, cervicothoracic region: Secondary | ICD-10-CM | POA: Diagnosis not present

## 2020-10-12 DIAGNOSIS — M9901 Segmental and somatic dysfunction of cervical region: Secondary | ICD-10-CM | POA: Diagnosis not present

## 2020-10-12 DIAGNOSIS — J320 Chronic maxillary sinusitis: Secondary | ICD-10-CM | POA: Diagnosis not present

## 2020-10-12 DIAGNOSIS — R519 Headache, unspecified: Secondary | ICD-10-CM | POA: Diagnosis not present

## 2020-10-12 DIAGNOSIS — M9902 Segmental and somatic dysfunction of thoracic region: Secondary | ICD-10-CM | POA: Diagnosis not present

## 2020-10-18 DIAGNOSIS — K219 Gastro-esophageal reflux disease without esophagitis: Secondary | ICD-10-CM | POA: Diagnosis not present

## 2020-10-18 DIAGNOSIS — K5909 Other constipation: Secondary | ICD-10-CM | POA: Diagnosis not present

## 2020-10-18 DIAGNOSIS — R1013 Epigastric pain: Secondary | ICD-10-CM | POA: Diagnosis not present

## 2020-10-18 LAB — HEMOGLOBIN A1C: Hemoglobin A1C, External: 6.4 % (ref 3.2–7.0)

## 2020-10-19 DIAGNOSIS — J301 Allergic rhinitis due to pollen: Secondary | ICD-10-CM | POA: Diagnosis not present

## 2020-10-22 DIAGNOSIS — J301 Allergic rhinitis due to pollen: Secondary | ICD-10-CM | POA: Diagnosis not present

## 2020-10-23 DIAGNOSIS — S56912A Strain of unspecified muscles, fascia and tendons at forearm level, left arm, initial encounter: Secondary | ICD-10-CM | POA: Diagnosis not present

## 2020-10-26 DIAGNOSIS — R519 Headache, unspecified: Secondary | ICD-10-CM | POA: Diagnosis not present

## 2020-10-26 DIAGNOSIS — M9901 Segmental and somatic dysfunction of cervical region: Secondary | ICD-10-CM | POA: Diagnosis not present

## 2020-10-26 DIAGNOSIS — M9902 Segmental and somatic dysfunction of thoracic region: Secondary | ICD-10-CM | POA: Diagnosis not present

## 2020-10-26 DIAGNOSIS — J301 Allergic rhinitis due to pollen: Secondary | ICD-10-CM | POA: Diagnosis not present

## 2020-10-26 DIAGNOSIS — M5033 Other cervical disc degeneration, cervicothoracic region: Secondary | ICD-10-CM | POA: Diagnosis not present

## 2020-10-28 DIAGNOSIS — S56912A Strain of unspecified muscles, fascia and tendons at forearm level, left arm, initial encounter: Secondary | ICD-10-CM | POA: Diagnosis not present

## 2020-10-29 DIAGNOSIS — J301 Allergic rhinitis due to pollen: Secondary | ICD-10-CM | POA: Diagnosis not present

## 2020-11-01 DIAGNOSIS — E0520 Thyrotoxicosis with toxic multinodular goiter without thyrotoxic crisis or storm: Secondary | ICD-10-CM | POA: Diagnosis not present

## 2020-11-03 DIAGNOSIS — J320 Chronic maxillary sinusitis: Secondary | ICD-10-CM | POA: Diagnosis not present

## 2020-11-03 DIAGNOSIS — J321 Chronic frontal sinusitis: Secondary | ICD-10-CM | POA: Diagnosis not present

## 2020-11-03 DIAGNOSIS — J301 Allergic rhinitis due to pollen: Secondary | ICD-10-CM | POA: Diagnosis not present

## 2020-11-05 DIAGNOSIS — S56912A Strain of unspecified muscles, fascia and tendons at forearm level, left arm, initial encounter: Secondary | ICD-10-CM | POA: Diagnosis not present

## 2020-11-09 DIAGNOSIS — J301 Allergic rhinitis due to pollen: Secondary | ICD-10-CM | POA: Diagnosis not present

## 2020-11-10 DIAGNOSIS — J328 Other chronic sinusitis: Secondary | ICD-10-CM | POA: Diagnosis not present

## 2020-11-10 DIAGNOSIS — J301 Allergic rhinitis due to pollen: Secondary | ICD-10-CM | POA: Diagnosis not present

## 2020-11-12 DIAGNOSIS — J301 Allergic rhinitis due to pollen: Secondary | ICD-10-CM | POA: Diagnosis not present

## 2020-11-15 ENCOUNTER — Ambulatory Visit: Attending: Adult Health | Primary: Internal Medicine

## 2020-11-15 ENCOUNTER — Ambulatory Visit: Admit: 2020-11-15 | Discharge: 2020-11-15 | Payer: MEDICARE | Attending: Adult Health | Primary: Internal Medicine

## 2020-11-15 DIAGNOSIS — R1032 Left lower quadrant pain: Secondary | ICD-10-CM

## 2020-11-15 LAB — AMB POC URINALYSIS DIP STICK AUTO W/O MICRO (PGU)
Bilirubin (UA POC): NEGATIVE
Bilirubin, Urine, POC: NEGATIVE
Blood (UA POC): NEGATIVE
Blood (UA POC): NEGATIVE
Glucose (UA POC): NEGATIVE mg/dL
Glucose, Urine, POC: NEGATIVE mg/dL
KETONES, Urine, POC: NEGATIVE
Ketones (UA POC): NEGATIVE
Leukocyte Esterase, Urine, POC: NEGATIVE
Leukocyte esterase (UA POC): NEGATIVE
Nitrite, Urine, POC: NEGATIVE
Nitrites (UA POC): NEGATIVE
Protein (UA POC): NEGATIVE
Protein, Urine, POC: NEGATIVE
Specific Gravity, Urine, POC: 1.03 NA (ref 1.001–1.035)
Specific gravity (UA POC): 1.03 (ref 1.001–1.035)
Urobilinogen (POC): 0.2
Urobilinogen, POC: 0.2
pH (UA POC): 5.5 (ref 4.6–8.0)
pH, Urine, POC: 5.5 NA (ref 4.6–8.0)

## 2020-11-15 NOTE — Progress Notes (Signed)
[facility]  [address]  [place], [place] [address]  [phone]          Jason Fernandez  DOB: 28-Dec-1945    Chief Complaint   Patient presents with   ??? Groin Pain          HPI     Jason Fernandez is a 75 y.o. male  Here today with complaints of new onset left groin pain.  The pain is in the left lower quadrant of the abdomen and radiates into the left groin area.  He reports the pain is been on and off for about 2 weeks.  Is not severe but it is constant.  Is a dull ache.  He has tried to use heat to the area.    He has not had any change in urination, he denies any fever chills or gross hematuria.  He has had no nausea vomiting constipation or diarrhea.    Significant history of prostate cancer and BPH.  No longer on Flomax or Proscar.  The stream is slow at times with mild daytime frequency.  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  Patient had brachytherapy for his prostate cancer completed this in 11/2002.  PSA remains stable with his last PSA being 0.094.  This was done 09/16/2020.    Patient is accompanied by his wife today.      Past Medical History:   Diagnosis Date   ??? Hypercholesterolemia    ??? Hypertension    ??? Personal history of prostate cancer      Past Surgical History:   Procedure Laterality Date   ??? HX OTHER SURGICAL      brachytherapy 3/04     Current Outpatient Medications   Medication Sig Dispense Refill   ??? FLUoxetine (PROzac) 20 mg tablet Take 20 mg by mouth daily.     ??? doxycycline (MONODOX) 100 mg capsule Take 1 Capsule by mouth two (2) times a day. 20 Capsule 0   ??? donepeziL (ARICEPT) 5 mg tablet Take  by mouth nightly.     ??? finasteride (PROSCAR) 5 mg tablet Take 5 mg by mouth daily.     ??? aspirin delayed-release 81 mg tablet Take 4 Tabs by mouth daily. 30 Tab 1   ??? metFORMIN ER (GLUCOPHAGE XR) 500 mg tablet      ??? tamsulosin (FLOMAX) 0.4 mg capsule      ??? Cholecalciferol, Vitamin D3, (VITAMIN D3) 2,000 unit cap capsule Take  by mouth two (2) times a day.        Allergies   Allergen Reactions   ??? Lisinopril Hives   ??? Meloxicam Hives   ??? Oxaprozin Rash   ??? Sulfa (Sulfonamide Antibiotics) Rash     Social History     Socioeconomic History   ??? Marital status: MARRIED     Spouse name: Not on file   ??? Number of children: Not on file   ??? Years of education: Not on file   ??? Highest education level: Not on file   Occupational History   ??? Not on file   Tobacco Use   ??? Smoking status: Never Smoker   ??? Smokeless tobacco: Not on file   Substance and Sexual Activity   ??? Alcohol use: No   ??? Drug use: Not on file   ??? Sexual activity: Not on file   Other Topics Concern   ??? Not on file   Social History Narrative   ???  Not on file     Social Determinants of Health     Financial Resource Strain:    ??? Difficulty of Paying Living Expenses: Not on file   Food Insecurity:    ??? Worried About Running Out of Food in the Last Year: Not on file   ??? Ran Out of Food in the Last Year: Not on file   Transportation Needs:    ??? Lack of Transportation (Medical): Not on file   ??? Lack of Transportation (Non-Medical): Not on file   Physical Activity:    ??? Days of Exercise per Week: Not on file   ??? Minutes of Exercise per Session: Not on file   Stress:    ??? Feeling of Stress : Not on file   Social Connections:    ??? Frequency of Communication with Friends and Family: Not on file   ??? Frequency of Social Gatherings with Friends and Family: Not on file   ??? Attends Religious Services: Not on file   ??? Active Member of Clubs or Organizations: Not on file   ??? Attends Archivist Meetings: Not on file   ??? Marital Status: Not on file   Intimate Partner Violence:    ??? Fear of Current or Ex-Partner: Not on file   ??? Emotionally Abused: Not on file   ??? Physically Abused: Not on file   ??? Sexually Abused: Not on file   Housing Stability:    ??? Unable to Pay for Housing in the Last Year: Not on file   ??? Number of Places Lived in the Last Year: Not on file   ??? Unstable Housing in the Last Year: Not on file     No family  history on file.    Review of Systems  Constitutional:   Negative for fever.  GI:  Negative for nausea.  Musculoskeletal:  Negative for back pain.      Urinalysis  UA - Dipstick  Results for orders placed or performed in visit on 11/15/20   AMB POC URINALYSIS DIP STICK AUTO W/O MICRO (PGU)     Status: None   Result Value Ref Range Status    Glucose (UA POC) Negative Negative mg/dL Final    Bilirubin (UA POC) Negative Negative Final    Ketones (UA POC) Negative Negative Final    Specific gravity (UA POC) 1.030 1.001 - 1.035 Final    Blood (UA POC) Negative Negative Final    pH (UA POC) 5.5 4.6 - 8.0 Final    Protein (UA POC) Negative Negative Final    Urobilinogen (POC) 0.2 mg/dL  Final    Nitrites (UA POC) Negative Negative Final    Leukocyte esterase (UA POC) Negative Negative Final   Results for orders placed or performed in visit on 07/20/17   AMB POC URINALYSIS DIP STICK AUTO W/ MICRO (PGU)     Status: None   Result Value Ref Range Status    Glucose (UA POC) Negative Negative mg/dL Final    Bilirubin (UA POC) Negative Negative Final    Ketones (UA POC) Negative Negative Final    Specific gravity (UA POC) 1.020 1.001 - 1.035 Final    Blood (UA POC) Negative Negative Final    pH (UA POC) 7 4.6 - 8.0 Final    Protein (UA POC) Negative Negative Final    Urobilinogen (POC) 0.2 mg/dL  Final    Nitrites (UA POC) Negative Negative Final    Leukocyte esterase (UA POC) Negative Negative  Final       UA - Micro  WBC - 0  RBC - 0  Bacteria - 0  Epith - 0    PHYSICAL EXAM    GENERAL: No acute distress, Awake, Alert, Oriented X 3, Gait normal  ABDOMEN: soft, non tender, non-distended, positive bowel sounds, no organomegaly, no palpable masses, no guarding, no rebound tenderness, no bulging or hernias present.  SKIN: No rash, no erythema, no lacerations or abrasions, no ecchymosis  MUSCULOSKELETAL - MAEW, no edema       Assessment and Plan    ICD-10-CM ICD-9-CM    1. Groin pain, left  R10.32 789.04 AMB POC URINALYSIS DIP STICK  AUTO W/O MICRO (PGU)      CT ABD PELV WO CONT   2. Prostate cancer ([place])  [id number]      PLAN:  CT abdomen pelvis without contrast will be ordered  I will call him those results.  Recommended follow-up at that time.    Holli Humbles, NP  Dr. Shawn Stall is supervising physician today and he approves plan of care.    Follow-up and Dispositions    ?? Return for after testing.       Elements of this note have been dictated using speech recognition software.  Although reviewed, errors of speech recognition may have occurred.

## 2020-11-16 DIAGNOSIS — J301 Allergic rhinitis due to pollen: Secondary | ICD-10-CM | POA: Diagnosis not present

## 2020-11-19 DIAGNOSIS — J301 Allergic rhinitis due to pollen: Secondary | ICD-10-CM | POA: Diagnosis not present

## 2020-11-23 ENCOUNTER — Inpatient Hospital Stay: Admit: 2020-11-23 | Payer: MEDICARE | Attending: Adult Health | Primary: Internal Medicine

## 2020-11-23 DIAGNOSIS — J301 Allergic rhinitis due to pollen: Secondary | ICD-10-CM | POA: Diagnosis not present

## 2020-11-23 DIAGNOSIS — M9902 Segmental and somatic dysfunction of thoracic region: Secondary | ICD-10-CM | POA: Diagnosis not present

## 2020-11-23 DIAGNOSIS — M9901 Segmental and somatic dysfunction of cervical region: Secondary | ICD-10-CM | POA: Diagnosis not present

## 2020-11-23 DIAGNOSIS — R519 Headache, unspecified: Secondary | ICD-10-CM | POA: Diagnosis not present

## 2020-11-23 DIAGNOSIS — M5033 Other cervical disc degeneration, cervicothoracic region: Secondary | ICD-10-CM | POA: Diagnosis not present

## 2020-11-23 DIAGNOSIS — R1032 Left lower quadrant pain: Secondary | ICD-10-CM

## 2020-11-23 MED ORDER — DIATRIZOATE MEGLUMINE & SODIUM 66 %-10 % ORAL SOLN
66-10 % | Freq: Once | ORAL | Status: AC
Start: 2020-11-23 — End: 2020-11-23
  Administered 2020-11-23: 18:00:00 via ORAL

## 2020-11-23 NOTE — Progress Notes (Signed)
Progress  Notes by Pollie Meyer at 11/24/20 [place]                Author: Pollie Meyer  Service: --  Author Type: Medical Assistant       Filed: 11/24/20 [id number]  Date of Service: 11/24/20 1647  Status: Signed          Editor: Pollie Meyer (Medical Assistant)               Called pt and left a detailed VM

## 2020-11-23 NOTE — Progress Notes (Signed)
Progress Notes by Holli Humbles, NP at 11/24/20 [phone]                Author: Holli Humbles, NP  Service: Nurse Practitioner  Author Type: Nurse Practitioner       Filed: 11/24/20 0911  Date of Service: 11/24/20 0911  Status: Signed          Editor: Holli Humbles, NP (Nurse Practitioner)               No stones are present. He does have small right inguinal hernia, but nothing is present on the left side.

## 2020-11-26 DIAGNOSIS — J301 Allergic rhinitis due to pollen: Secondary | ICD-10-CM | POA: Diagnosis not present

## 2020-12-02 DIAGNOSIS — E0520 Thyrotoxicosis with toxic multinodular goiter without thyrotoxic crisis or storm: Secondary | ICD-10-CM | POA: Diagnosis not present

## 2020-12-07 DIAGNOSIS — J301 Allergic rhinitis due to pollen: Secondary | ICD-10-CM | POA: Diagnosis not present

## 2020-12-10 DIAGNOSIS — J301 Allergic rhinitis due to pollen: Secondary | ICD-10-CM | POA: Diagnosis not present

## 2020-12-17 DIAGNOSIS — J301 Allergic rhinitis due to pollen: Secondary | ICD-10-CM | POA: Diagnosis not present

## 2020-12-21 DIAGNOSIS — M9902 Segmental and somatic dysfunction of thoracic region: Secondary | ICD-10-CM | POA: Diagnosis not present

## 2020-12-21 DIAGNOSIS — M9901 Segmental and somatic dysfunction of cervical region: Secondary | ICD-10-CM | POA: Diagnosis not present

## 2020-12-21 DIAGNOSIS — R519 Headache, unspecified: Secondary | ICD-10-CM | POA: Diagnosis not present

## 2020-12-21 DIAGNOSIS — M5033 Other cervical disc degeneration, cervicothoracic region: Secondary | ICD-10-CM | POA: Diagnosis not present

## 2020-12-31 DIAGNOSIS — J301 Allergic rhinitis due to pollen: Secondary | ICD-10-CM | POA: Diagnosis not present

## 2021-01-07 DIAGNOSIS — J301 Allergic rhinitis due to pollen: Secondary | ICD-10-CM | POA: Diagnosis not present

## 2021-01-14 DIAGNOSIS — J301 Allergic rhinitis due to pollen: Secondary | ICD-10-CM | POA: Diagnosis not present

## 2021-01-18 DIAGNOSIS — R519 Headache, unspecified: Secondary | ICD-10-CM | POA: Diagnosis not present

## 2021-01-18 DIAGNOSIS — M9902 Segmental and somatic dysfunction of thoracic region: Secondary | ICD-10-CM | POA: Diagnosis not present

## 2021-01-18 DIAGNOSIS — M9901 Segmental and somatic dysfunction of cervical region: Secondary | ICD-10-CM | POA: Diagnosis not present

## 2021-01-18 DIAGNOSIS — M5033 Other cervical disc degeneration, cervicothoracic region: Secondary | ICD-10-CM | POA: Diagnosis not present

## 2021-01-26 DIAGNOSIS — J301 Allergic rhinitis due to pollen: Secondary | ICD-10-CM | POA: Diagnosis not present

## 2021-01-28 DIAGNOSIS — J301 Allergic rhinitis due to pollen: Secondary | ICD-10-CM | POA: Diagnosis not present

## 2021-02-03 LAB — HEMOGLOBIN A1C: Hemoglobin A1C, External: 6.6 % (ref 3.2–7.0)

## 2021-02-04 DIAGNOSIS — J301 Allergic rhinitis due to pollen: Secondary | ICD-10-CM | POA: Diagnosis not present

## 2021-02-10 ENCOUNTER — Encounter: Payer: PPO | Admitting: Dermatology

## 2021-02-11 DIAGNOSIS — J301 Allergic rhinitis due to pollen: Secondary | ICD-10-CM | POA: Diagnosis not present

## 2021-02-15 DIAGNOSIS — R519 Headache, unspecified: Secondary | ICD-10-CM | POA: Diagnosis not present

## 2021-02-15 DIAGNOSIS — M5033 Other cervical disc degeneration, cervicothoracic region: Secondary | ICD-10-CM | POA: Diagnosis not present

## 2021-02-15 DIAGNOSIS — M9902 Segmental and somatic dysfunction of thoracic region: Secondary | ICD-10-CM | POA: Diagnosis not present

## 2021-02-15 DIAGNOSIS — M9901 Segmental and somatic dysfunction of cervical region: Secondary | ICD-10-CM | POA: Diagnosis not present

## 2021-02-16 ENCOUNTER — Encounter: Payer: PPO | Admitting: Dermatology

## 2021-02-25 DIAGNOSIS — J301 Allergic rhinitis due to pollen: Secondary | ICD-10-CM | POA: Diagnosis not present

## 2021-03-08 DIAGNOSIS — J301 Allergic rhinitis due to pollen: Secondary | ICD-10-CM | POA: Diagnosis not present

## 2021-03-15 DIAGNOSIS — M9902 Segmental and somatic dysfunction of thoracic region: Secondary | ICD-10-CM | POA: Diagnosis not present

## 2021-03-15 DIAGNOSIS — M9901 Segmental and somatic dysfunction of cervical region: Secondary | ICD-10-CM | POA: Diagnosis not present

## 2021-03-15 DIAGNOSIS — R519 Headache, unspecified: Secondary | ICD-10-CM | POA: Diagnosis not present

## 2021-03-15 DIAGNOSIS — M5033 Other cervical disc degeneration, cervicothoracic region: Secondary | ICD-10-CM | POA: Diagnosis not present

## 2021-03-18 DIAGNOSIS — J301 Allergic rhinitis due to pollen: Secondary | ICD-10-CM | POA: Diagnosis not present

## 2021-03-24 DIAGNOSIS — M5033 Other cervical disc degeneration, cervicothoracic region: Secondary | ICD-10-CM | POA: Diagnosis not present

## 2021-03-24 DIAGNOSIS — R519 Headache, unspecified: Secondary | ICD-10-CM | POA: Diagnosis not present

## 2021-03-24 DIAGNOSIS — M9901 Segmental and somatic dysfunction of cervical region: Secondary | ICD-10-CM | POA: Diagnosis not present

## 2021-03-24 DIAGNOSIS — M9902 Segmental and somatic dysfunction of thoracic region: Secondary | ICD-10-CM | POA: Diagnosis not present

## 2021-03-25 DIAGNOSIS — J301 Allergic rhinitis due to pollen: Secondary | ICD-10-CM | POA: Diagnosis not present

## 2021-03-28 DIAGNOSIS — M9901 Segmental and somatic dysfunction of cervical region: Secondary | ICD-10-CM | POA: Diagnosis not present

## 2021-03-28 DIAGNOSIS — M9902 Segmental and somatic dysfunction of thoracic region: Secondary | ICD-10-CM | POA: Diagnosis not present

## 2021-03-28 DIAGNOSIS — M5033 Other cervical disc degeneration, cervicothoracic region: Secondary | ICD-10-CM | POA: Diagnosis not present

## 2021-03-28 DIAGNOSIS — R519 Headache, unspecified: Secondary | ICD-10-CM | POA: Diagnosis not present

## 2021-03-31 DIAGNOSIS — N401 Enlarged prostate with lower urinary tract symptoms: Secondary | ICD-10-CM | POA: Diagnosis not present

## 2021-03-31 DIAGNOSIS — N200 Calculus of kidney: Secondary | ICD-10-CM | POA: Diagnosis not present

## 2021-04-05 DIAGNOSIS — J301 Allergic rhinitis due to pollen: Secondary | ICD-10-CM | POA: Diagnosis not present

## 2021-04-06 ENCOUNTER — Other Ambulatory Visit: Payer: Self-pay

## 2021-04-06 ENCOUNTER — Encounter: Payer: Self-pay | Admitting: Dermatology

## 2021-04-06 ENCOUNTER — Ambulatory Visit: Payer: PPO | Admitting: Dermatology

## 2021-04-06 DIAGNOSIS — D229 Melanocytic nevi, unspecified: Secondary | ICD-10-CM

## 2021-04-06 DIAGNOSIS — L578 Other skin changes due to chronic exposure to nonionizing radiation: Secondary | ICD-10-CM | POA: Diagnosis not present

## 2021-04-06 DIAGNOSIS — L820 Inflamed seborrheic keratosis: Secondary | ICD-10-CM

## 2021-04-06 DIAGNOSIS — D1800 Hemangioma unspecified site: Secondary | ICD-10-CM

## 2021-04-06 DIAGNOSIS — L710 Perioral dermatitis: Secondary | ICD-10-CM

## 2021-04-06 DIAGNOSIS — Z1283 Encounter for screening for malignant neoplasm of skin: Secondary | ICD-10-CM

## 2021-04-06 DIAGNOSIS — L814 Other melanin hyperpigmentation: Secondary | ICD-10-CM | POA: Diagnosis not present

## 2021-04-06 DIAGNOSIS — L821 Other seborrheic keratosis: Secondary | ICD-10-CM | POA: Diagnosis not present

## 2021-04-06 NOTE — Patient Instructions (Signed)
Instructions for [facility] Medications  One or more of your medications was sent to the [facility] mail order compounding pharmacy. You will receive an email from them and can purchase the medicine through that link. It will then be mailed to your home at the address you confirmed. If for any reason you do not receive an email from them, please check your spam folder. If you still do not find the email, please let us know. [facility] phone number is [phone].   If you have any questions or concerns for your doctor, please call our main line at [phone] and press option 4 to reach your doctor's medical assistant. If no one answers, please leave a voicemail as directed and we will return your call as soon as possible. Messages left after 4 pm will be answered the following business day.   You may also send us a message via MyChart. We typically respond to MyChart messages within 1-2 business days.  For prescription refills, please ask your pharmacy to contact our office. Our fax number is [phone].  If you have an urgent issue when the clinic is closed that cannot wait until the next business day, you can page your doctor at the number below.    Please note that while we do our best to be available for urgent issues outside of office hours, we are not available 24/7.   If you have an urgent issue and are unable to reach us, you may choose to seek medical care at your doctor's office, retail clinic, urgent care center, or emergency room.  If you have a medical emergency, please immediately call 911 or go to the emergency department.  Pager Numbers  - Dr. Kowalski: [phone]  - Dr. Moye: [phone]  - Dr. Stewart: [phone]  In the event of inclement weather, please call our main line at [phone] for an update on the status of any delays or closures.  Dermatology Medication Tips: Please keep the boxes that topical medications come in in order to help  keep track of the instructions about where and how to use these. Pharmacies typically print the medication instructions only on the boxes and not directly on the medication tubes.   If your medication is too expensive, please contact our office at [phone] option 4 or send us a message through MyChart.   We are unable to tell what your co-pay for medications will be in advance as this is different depending on your insurance coverage. However, we may be able to find a substitute medication at lower cost or fill out paperwork to get insurance to cover a needed medication.   If a prior authorization is required to get your medication covered by your insurance company, please allow us 1-2 business days to complete this process.  Drug prices often vary depending on where the prescription is filled and some pharmacies may offer cheaper prices.  The website www.goodrx.com contains coupons for medications through different pharmacies. The prices here do not account for what the cost may be with help from insurance (it may be cheaper with your insurance), but the website can give you the price if you did not use any insurance.  - You can print the associated coupon and take it with your prescription to the pharmacy.  - You may also stop by our office during regular business hours and pick up a GoodRx coupon card.  - If you need your prescription sent electronically to a different pharmacy, notify our office   through [facility] MyChart or by phone at [phone] option 4.  

## 2021-04-06 NOTE — Progress Notes (Signed)
Follow-Up Visit   Subjective  Jimmy Obrien is a 75 y.o. male who presents for the following: Annual Exam (No history of skin cancer or abnormal moles - TBSE today).  he complains of rash of the face that has been persistent. The patient presents for Total-Body Skin Exam (TBSE) for skin cancer screening and mole check.  The following portions of the chart were reviewed this encounter and updated as appropriate:   Tobacco  Allergies  Meds  Problems  Med Hx  Surg Hx  Fam Hx     Review of Systems:  No other skin or systemic complaints except as noted in HPI or Assessment and Plan.  Objective  Well appearing patient in no apparent distress; mood and affect are within normal limits.  A full examination was performed including scalp, head, eyes, ears, nose, lips, neck, chest, axillae, abdomen, back, buttocks, bilateral upper extremities, bilateral lower extremities, hands, feet, fingers, toes, fingernails, and toenails. All findings within normal limits unless otherwise noted below.  Perioral, periocular Pink papules   Left tricep x 1, left low back x1 (2) Erythematous keratotic or waxy stuck-on papule or plaque.    Assessment & Plan   Lentigines - Scattered tan macules - Due to sun exposure - Benign-appering, observe - Recommend daily broad spectrum sunscreen SPF 30+ to sun-exposed areas, reapply every 2 hours as needed. - Call for any changes  Seborrheic Keratoses - Stuck-on, waxy, tan-brown papules and/or plaques  - Benign-appearing - Discussed benign etiology and prognosis. - Observe - Call for any changes  Melanocytic Nevi - Tan-brown and/or pink-flesh-colored symmetric macules and papules - Benign appearing on exam today - Observation - Call clinic for new or changing moles - Recommend daily use of broad spectrum spf 30+ sunscreen to sun-exposed areas.   Hemangiomas - Red papules - Discussed benign nature - Observe - Call for any changes  Actinic  Damage - Chronic condition, secondary to cumulative UV/sun exposure - diffuse scaly erythematous macules with underlying dyspigmentation - Recommend daily broad spectrum sunscreen SPF 30+ to sun-exposed areas, reapply every 2 hours as needed.  - Staying in the shade or wearing long sleeves, sun glasses (UVA+UVB protection) and wide brim hats (4-inch brim around the entire circumference of the hat) are also recommended for sun protection.  - Call for new or changing lesions.  Skin cancer screening performed today.  Perioral dermatitis Perioral, periocular Chronic and persistent  Will prescribe [facility] metronidazole/ivermectin/azelaic acid twice daily as needed to affected areas on the face. The patient was advised this is not covered by insurance since it is made by a compounding pharmacy. They will receive an email to check out and the medication will be mailed to their home.    May consider doxycycline if not improving  Inflamed seborrheic keratosis Left tricep x 1, left low back x1  Destruction of lesion - Left tricep x 1, left low back x1 Complexity: simple   Destruction method: cryotherapy   Informed consent: discussed and consent obtained   Timeout:  patient name, date of birth, surgical site, and procedure verified Lesion destroyed using liquid nitrogen: Yes   Region frozen until ice ball extended beyond lesion: Yes   Outcome: patient tolerated procedure well with no complications   Post-procedure details: wound care instructions given    Return in about 1 year (around 04/06/2022) for TBSE.  I, Ashok Cordia, CMA, am acting as scribe for Sarina Ser, MD . Documentation: I have reviewed the above documentation for  accuracy and completeness, and I agree with the above.  Sarina Ser, MD

## 2021-04-07 DIAGNOSIS — N401 Enlarged prostate with lower urinary tract symptoms: Secondary | ICD-10-CM | POA: Diagnosis not present

## 2021-04-12 DIAGNOSIS — M9902 Segmental and somatic dysfunction of thoracic region: Secondary | ICD-10-CM | POA: Diagnosis not present

## 2021-04-12 DIAGNOSIS — R519 Headache, unspecified: Secondary | ICD-10-CM | POA: Diagnosis not present

## 2021-04-12 DIAGNOSIS — M5033 Other cervical disc degeneration, cervicothoracic region: Secondary | ICD-10-CM | POA: Diagnosis not present

## 2021-04-12 DIAGNOSIS — M9901 Segmental and somatic dysfunction of cervical region: Secondary | ICD-10-CM | POA: Diagnosis not present

## 2021-04-14 DIAGNOSIS — J3089 Other allergic rhinitis: Secondary | ICD-10-CM | POA: Diagnosis not present

## 2021-04-14 DIAGNOSIS — J4530 Mild persistent asthma, uncomplicated: Secondary | ICD-10-CM | POA: Diagnosis not present

## 2021-04-15 DIAGNOSIS — K4090 Unilateral inguinal hernia, without obstruction or gangrene, not specified as recurrent: Secondary | ICD-10-CM | POA: Diagnosis not present

## 2021-04-15 DIAGNOSIS — N401 Enlarged prostate with lower urinary tract symptoms: Secondary | ICD-10-CM | POA: Diagnosis not present

## 2021-04-15 DIAGNOSIS — N5201 Erectile dysfunction due to arterial insufficiency: Secondary | ICD-10-CM | POA: Diagnosis not present

## 2021-04-18 ENCOUNTER — Other Ambulatory Visit: Payer: Self-pay | Admitting: Dermatology

## 2021-04-18 DIAGNOSIS — L409 Psoriasis, unspecified: Secondary | ICD-10-CM

## 2021-04-18 DIAGNOSIS — J301 Allergic rhinitis due to pollen: Secondary | ICD-10-CM | POA: Diagnosis not present

## 2021-04-19 DIAGNOSIS — J019 Acute sinusitis, unspecified: Secondary | ICD-10-CM | POA: Diagnosis not present

## 2021-04-19 DIAGNOSIS — J029 Acute pharyngitis, unspecified: Secondary | ICD-10-CM | POA: Diagnosis not present

## 2021-04-20 DIAGNOSIS — J301 Allergic rhinitis due to pollen: Secondary | ICD-10-CM | POA: Diagnosis not present

## 2021-04-29 DIAGNOSIS — J301 Allergic rhinitis due to pollen: Secondary | ICD-10-CM | POA: Diagnosis not present

## 2021-05-03 DIAGNOSIS — J302 Other seasonal allergic rhinitis: Secondary | ICD-10-CM | POA: Diagnosis not present

## 2021-05-03 DIAGNOSIS — J0140 Acute pansinusitis, unspecified: Secondary | ICD-10-CM | POA: Diagnosis not present

## 2021-05-04 DIAGNOSIS — J312 Chronic pharyngitis: Secondary | ICD-10-CM | POA: Diagnosis not present

## 2021-05-04 DIAGNOSIS — J301 Allergic rhinitis due to pollen: Secondary | ICD-10-CM | POA: Diagnosis not present

## 2021-05-04 DIAGNOSIS — K219 Gastro-esophageal reflux disease without esophagitis: Secondary | ICD-10-CM | POA: Diagnosis not present

## 2021-05-06 DIAGNOSIS — J301 Allergic rhinitis due to pollen: Secondary | ICD-10-CM | POA: Diagnosis not present

## 2021-05-10 DIAGNOSIS — M9902 Segmental and somatic dysfunction of thoracic region: Secondary | ICD-10-CM | POA: Diagnosis not present

## 2021-05-10 DIAGNOSIS — M9901 Segmental and somatic dysfunction of cervical region: Secondary | ICD-10-CM | POA: Diagnosis not present

## 2021-05-10 DIAGNOSIS — R519 Headache, unspecified: Secondary | ICD-10-CM | POA: Diagnosis not present

## 2021-05-10 DIAGNOSIS — M5033 Other cervical disc degeneration, cervicothoracic region: Secondary | ICD-10-CM | POA: Diagnosis not present

## 2021-05-13 DIAGNOSIS — J301 Allergic rhinitis due to pollen: Secondary | ICD-10-CM | POA: Diagnosis not present

## 2021-05-18 LAB — HEMOGLOBIN A1C: Hemoglobin A1C, External: 6.7 % (ref 3.2–7.0)

## 2021-05-20 DIAGNOSIS — J301 Allergic rhinitis due to pollen: Secondary | ICD-10-CM | POA: Diagnosis not present

## 2021-05-27 DIAGNOSIS — J301 Allergic rhinitis due to pollen: Secondary | ICD-10-CM | POA: Diagnosis not present

## 2021-06-03 DIAGNOSIS — J301 Allergic rhinitis due to pollen: Secondary | ICD-10-CM | POA: Diagnosis not present

## 2021-06-07 DIAGNOSIS — M9902 Segmental and somatic dysfunction of thoracic region: Secondary | ICD-10-CM | POA: Diagnosis not present

## 2021-06-07 DIAGNOSIS — H2513 Age-related nuclear cataract, bilateral: Secondary | ICD-10-CM | POA: Diagnosis not present

## 2021-06-07 DIAGNOSIS — M5033 Other cervical disc degeneration, cervicothoracic region: Secondary | ICD-10-CM | POA: Diagnosis not present

## 2021-06-07 DIAGNOSIS — M9901 Segmental and somatic dysfunction of cervical region: Secondary | ICD-10-CM | POA: Diagnosis not present

## 2021-06-07 DIAGNOSIS — H35372 Puckering of macula, left eye: Secondary | ICD-10-CM | POA: Diagnosis not present

## 2021-06-07 DIAGNOSIS — R519 Headache, unspecified: Secondary | ICD-10-CM | POA: Diagnosis not present

## 2021-06-10 DIAGNOSIS — J301 Allergic rhinitis due to pollen: Secondary | ICD-10-CM | POA: Diagnosis not present

## 2021-06-24 DIAGNOSIS — J301 Allergic rhinitis due to pollen: Secondary | ICD-10-CM | POA: Diagnosis not present

## 2021-06-27 DIAGNOSIS — H2511 Age-related nuclear cataract, right eye: Secondary | ICD-10-CM | POA: Diagnosis not present

## 2021-06-29 ENCOUNTER — Encounter: Payer: Self-pay | Admitting: Ophthalmology

## 2021-07-01 DIAGNOSIS — J301 Allergic rhinitis due to pollen: Secondary | ICD-10-CM | POA: Diagnosis not present

## 2021-07-05 DIAGNOSIS — M9902 Segmental and somatic dysfunction of thoracic region: Secondary | ICD-10-CM | POA: Diagnosis not present

## 2021-07-05 DIAGNOSIS — M5033 Other cervical disc degeneration, cervicothoracic region: Secondary | ICD-10-CM | POA: Diagnosis not present

## 2021-07-05 DIAGNOSIS — R519 Headache, unspecified: Secondary | ICD-10-CM | POA: Diagnosis not present

## 2021-07-05 DIAGNOSIS — M9901 Segmental and somatic dysfunction of cervical region: Secondary | ICD-10-CM | POA: Diagnosis not present

## 2021-07-07 DIAGNOSIS — J310 Chronic rhinitis: Secondary | ICD-10-CM | POA: Diagnosis not present

## 2021-07-07 DIAGNOSIS — G4710 Hypersomnia, unspecified: Secondary | ICD-10-CM | POA: Diagnosis not present

## 2021-07-07 DIAGNOSIS — K219 Gastro-esophageal reflux disease without esophagitis: Secondary | ICD-10-CM | POA: Diagnosis not present

## 2021-07-07 DIAGNOSIS — G4733 Obstructive sleep apnea (adult) (pediatric): Secondary | ICD-10-CM | POA: Diagnosis not present

## 2021-07-08 DIAGNOSIS — J301 Allergic rhinitis due to pollen: Secondary | ICD-10-CM | POA: Diagnosis not present

## 2021-07-12 NOTE — Discharge Instructions (Signed)

## 2021-07-13 ENCOUNTER — Other Ambulatory Visit: Payer: Self-pay

## 2021-07-13 ENCOUNTER — Ambulatory Visit: Payer: PPO | Admitting: Anesthesiology

## 2021-07-13 ENCOUNTER — Encounter
Admission: RE | Disposition: A | Discharge status: Home / Self Care | Payer: Self-pay | Source: Home / Self Care | Attending: Ophthalmology

## 2021-07-13 ENCOUNTER — Ambulatory Visit
Admission: RE | Admit: 2021-07-13 | Discharge: 2021-07-13 | Disposition: A | Discharge status: Home / Self Care | Payer: PPO | Attending: Ophthalmology | Admitting: Ophthalmology

## 2021-07-13 ENCOUNTER — Encounter: Payer: Self-pay | Admitting: Ophthalmology

## 2021-07-13 DIAGNOSIS — Z7982 Long term (current) use of aspirin: Secondary | ICD-10-CM | POA: Diagnosis not present

## 2021-07-13 DIAGNOSIS — J45909 Unspecified asthma, uncomplicated: Secondary | ICD-10-CM | POA: Diagnosis not present

## 2021-07-13 DIAGNOSIS — I4891 Unspecified atrial fibrillation: Secondary | ICD-10-CM | POA: Insufficient documentation

## 2021-07-13 DIAGNOSIS — Z880 Allergy status to penicillin: Secondary | ICD-10-CM | POA: Insufficient documentation

## 2021-07-13 DIAGNOSIS — I10 Essential (primary) hypertension: Secondary | ICD-10-CM | POA: Insufficient documentation

## 2021-07-13 DIAGNOSIS — Z79899 Other long term (current) drug therapy: Secondary | ICD-10-CM | POA: Insufficient documentation

## 2021-07-13 DIAGNOSIS — Z7951 Long term (current) use of inhaled steroids: Secondary | ICD-10-CM | POA: Diagnosis not present

## 2021-07-13 DIAGNOSIS — Z882 Allergy status to sulfonamides status: Secondary | ICD-10-CM | POA: Diagnosis not present

## 2021-07-13 DIAGNOSIS — K219 Gastro-esophageal reflux disease without esophagitis: Secondary | ICD-10-CM | POA: Insufficient documentation

## 2021-07-13 DIAGNOSIS — Z881 Allergy status to other antibiotic agents status: Secondary | ICD-10-CM | POA: Diagnosis not present

## 2021-07-13 DIAGNOSIS — H2511 Age-related nuclear cataract, right eye: Secondary | ICD-10-CM | POA: Insufficient documentation

## 2021-07-13 DIAGNOSIS — K760 Fatty (change of) liver, not elsewhere classified: Secondary | ICD-10-CM | POA: Diagnosis not present

## 2021-07-13 DIAGNOSIS — H25811 Combined forms of age-related cataract, right eye: Secondary | ICD-10-CM | POA: Diagnosis not present

## 2021-07-13 DIAGNOSIS — J301 Allergic rhinitis due to pollen: Secondary | ICD-10-CM | POA: Diagnosis not present

## 2021-07-13 DIAGNOSIS — G4730 Sleep apnea, unspecified: Secondary | ICD-10-CM | POA: Insufficient documentation

## 2021-07-13 HISTORY — DX: Fatty (change of) liver, not elsewhere classified: K76.0

## 2021-07-13 HISTORY — PX: CATARACT EXTRACTION W/PHACO: SHX586

## 2021-07-13 HISTORY — DX: Unspecified asthma, uncomplicated: J45.909

## 2021-07-13 HISTORY — DX: Personal history of urinary calculi: Z87.442

## 2021-07-13 SURGERY — PHACOEMULSIFICATION, CATARACT, WITH IOL INSERTION
Anesthesia: Monitor Anesthesia Care | Site: Eye | Laterality: Right

## 2021-07-13 MED ORDER — SIGHTPATH DOSE#1 BSS IO SOLN
INTRAOCULAR | Status: DC | PRN
  Administered 2021-07-13: 1 mL

## 2021-07-13 MED ORDER — BRIMONIDINE TARTRATE-TIMOLOL 0.2-0.5 % OP SOLN
OPHTHALMIC | Status: DC | PRN
  Administered 2021-07-13: 1 [drp] via OPHTHALMIC

## 2021-07-13 MED ORDER — MIDAZOLAM HCL 2 MG/2ML IJ SOLN
INTRAMUSCULAR | Status: DC | PRN
  Administered 2021-07-13: 1 mg via INTRAVENOUS

## 2021-07-13 MED ORDER — FENTANYL CITRATE (PF) 100 MCG/2ML IJ SOLN
INTRAMUSCULAR | Status: DC | PRN
  Administered 2021-07-13: 50 ug via INTRAVENOUS

## 2021-07-13 MED ORDER — SIGHTPATH DOSE#1 BSS IO SOLN
INTRAOCULAR | Status: DC | PRN
  Administered 2021-07-13: 87 mL via OPHTHALMIC

## 2021-07-13 MED ORDER — SIGHTPATH DOSE#1 NA HYALUR & NA CHOND-NA HYALUR IO KIT
PACK | INTRAOCULAR | Status: DC | PRN
  Administered 2021-07-13: 1 via OPHTHALMIC

## 2021-07-13 MED ORDER — TETRACAINE HCL 0.5 % OP SOLN
1.0000 [drp] | OPHTHALMIC | Status: DC | PRN
  Administered 2021-07-13 (×3): 1 [drp] via OPHTHALMIC

## 2021-07-13 MED ORDER — ARMC OPHTHALMIC DILATING DROPS
1.0000 "application " | OPHTHALMIC | Status: DC | PRN
  Administered 2021-07-13 (×3): 1 via OPHTHALMIC

## 2021-07-13 MED ORDER — MOXIFLOXACIN HCL 0.5 % OP SOLN
OPHTHALMIC | Status: DC | PRN
  Administered 2021-07-13: 0.2 mL via OPHTHALMIC

## 2021-07-13 MED ORDER — SIGHTPATH DOSE#1 BSS IO SOLN
INTRAOCULAR | Status: DC | PRN
  Administered 2021-07-13: 15 mL

## 2021-07-13 SURGICAL SUPPLY — 17 items
CANNULA ANT/CHMB 27GA (MISCELLANEOUS) IMPLANT
GLOVE SRG 8 PF TXTR STRL LF DI (GLOVE) ×1 IMPLANT
GLOVE SURG ENC TEXT LTX SZ7.5 (GLOVE) ×2 IMPLANT
GLOVE SURG UNDER POLY LF SZ8 (GLOVE) ×2
GOWN STRL REUS W/ TWL LRG LVL3 (GOWN DISPOSABLE) ×2 IMPLANT
GOWN STRL REUS W/TWL LRG LVL3 (GOWN DISPOSABLE) ×4
LENS IOL TECNIS EYHANCE 21.5 (Intraocular Lens) ×2 IMPLANT
MARKER SKIN DUAL TIP RULER LAB (MISCELLANEOUS) ×2 IMPLANT
NEEDLE CAPSULORHEX 25GA (NEEDLE) IMPLANT
NEEDLE FILTER BLUNT 18X 1/2SAF (NEEDLE) ×2
NEEDLE FILTER BLUNT 18X1 1/2 (NEEDLE) ×2 IMPLANT
PACK EYE AFTER SURG (MISCELLANEOUS) IMPLANT
RING MALYGIN 7.0 (MISCELLANEOUS) IMPLANT
SYR 3ML LL SCALE MARK (SYRINGE) ×4 IMPLANT
SYR TB 1ML LUER SLIP (SYRINGE) ×2 IMPLANT
WATER STERILE IRR 250ML POUR (IV SOLUTION) ×2 IMPLANT
WIPE NON LINTING 3.25X3.25 (MISCELLANEOUS) ×2 IMPLANT

## 2021-07-13 NOTE — Anesthesia Preprocedure Evaluation (Signed)
Anesthesia Evaluation  Patient identified by MRN, date of birth, ID band Patient awake    Reviewed: Allergy & Precautions, NPO status   Airway Mallampati: II  TM Distance: >3 FB     Dental   Pulmonary asthma , sleep apnea (no cpap) ,    Pulmonary exam normal        Cardiovascular hypertension,  Rhythm:Regular Rate:Normal     Neuro/Psych    GI/Hepatic GERD  ,Fatty liver   Endo/Other    Renal/GU      Musculoskeletal  (+) Arthritis ,   Abdominal   Peds  Hematology   Anesthesia Other Findings   Reproductive/Obstetrics                             Anesthesia Physical Anesthesia Plan  ASA: 3  Anesthesia Plan: MAC   Post-op Pain Management:    Induction: Intravenous  PONV Risk Score and Plan: TIVA, Midazolam and Treatment may vary due to age or medical condition  Airway Management Planned: Natural Airway and Nasal Cannula  Additional Equipment:   Intra-op Plan:   Post-operative Plan:   Informed Consent: I have reviewed the patients History and Physical, chart, labs and discussed the procedure including the risks, benefits and alternatives for the proposed anesthesia with the patient or authorized representative who has indicated his/her understanding and acceptance.       Plan Discussed with: CRNA  Anesthesia Plan Comments:         Anesthesia Quick Evaluation

## 2021-07-13 NOTE — H&P (Signed)
[place]   Primary Care Physician:  Baxter Hire, MD Ophthalmologist: Dr. Leandrew Koyanagi  Pre-Procedure History & Physical: HPI:  Jimmy Obrien is a 75 y.o. male here for ophthalmic surgery.   Past Medical History:  Diagnosis Date   Asthma    exercise induced   BPH (benign prostatic hyperplasia)    Diverticulitis    Diverticulosis    Fatty liver    GERD (gastroesophageal reflux disease)    History of kidney stones    Hx of atrial fibrillation without current medication    Hypertension    Hyperthyroidism    Multinodular Goiter   Sleep apnea    No CPAP   Thrombocytopenia ([place])     Past Surgical History:  Procedure Laterality Date   CHOLECYSTECTOMY     COLON RESECTION     COLONOSCOPY WITH PROPOFOL N/A 03/31/2019   Procedure: COLONOSCOPY WITH PROPOFOL;  Surgeon: Toledo, Benay Pike, MD;  Location: [facility] ENDOSCOPY;  Service: Endoscopy;  Laterality: N/A;   ESOPHAGOGASTRODUODENOSCOPY (EGD) WITH PROPOFOL N/A 08/16/2016   Procedure: ESOPHAGOGASTRODUODENOSCOPY (EGD) WITH PROPOFOL;  Surgeon: Manya Silvas, MD;  Location: [facility] ENDOSCOPY;  Service: Endoscopy;  Laterality: N/A;   ESOPHAGOGASTRODUODENOSCOPY (EGD) WITH PROPOFOL N/A 03/31/2019   Procedure: ESOPHAGOGASTRODUODENOSCOPY (EGD) WITH PROPOFOL;  Surgeon: Toledo, Benay Pike, MD;  Location: [facility] ENDOSCOPY;  Service: Endoscopy;  Laterality: N/A;   EXTRACORPOREAL SHOCK WAVE LITHOTRIPSY Left 05/15/2019   Procedure: EXTRACORPOREAL SHOCK WAVE LITHOTRIPSY (ESWL);  Surgeon: Billey Co, MD;  Location: [facility] ORS;  Service: Urology;  Laterality: Left;   TRANSURETHRAL RESECTION OF PROSTATE      Prior to Admission medications   Medication Sig Start Date End Date Taking? Authorizing Provider  acetaminophen (TYLENOL) 650 MG CR tablet Take 1,300 mg by mouth 2 (two) times daily.   Yes [provider]  albuterol (PROVENTIL HFA;VENTOLIN HFA) 108 (90 Base) MCG/ACT inhaler Inhale 2 puffs into the lungs every 6 (six) hours  as needed for wheezing or shortness of breath.   Yes [provider]  amphetamine-dextroamphetamine (ADDERALL) 10 MG tablet Take 10 mg by mouth daily with breakfast.   Yes [provider]  Ascorbic Acid (VITAMIN C) 1000 MG tablet Take 1,000 mg by mouth 2 (two) times daily.   Yes [provider]  aspirin EC 81 MG tablet Take 81 mg by mouth daily.   Yes [provider]  atenolol (TENORMIN) 25 MG tablet Take 12.5 mg by mouth daily.   Yes [provider]  azelastine (ASTELIN) 0.1 % nasal spray Place 1 spray into both nostrils 2 (two) times daily. Use in each nostril as directed   Yes [provider]  budesonide (RHINOCORT AQUA) 32 MCG/ACT nasal spray Place 1 spray into both nostrils daily.   Yes [provider]  Camphor-Menthol-Methyl Sal (FLEXALL ULTRA PLUS EX) Apply 1 application topically as needed.   Yes [provider]  Cholecalciferol 10000 units CAPS Take 2,000 Units by mouth 1 day or 1 dose.   Yes [provider]  citalopram (CELEXA) 10 MG tablet  04/02/19  Yes [provider]  cyanocobalamin 1000 MCG tablet Take 1,000 mcg by mouth in the morning and at bedtime.   Yes [provider]  diazepam (VALIUM) 5 MG tablet Take 5 mg by mouth 2 (two) times daily as needed.   Yes [provider]  DUTASTERIDE-TAMSULOSIN HCL PO Take by mouth daily.   Yes [provider]  FLOVENT HFA 110 MCG/ACT inhaler  04/17/19  Yes  [provider]  gentamicin ointment (GARAMYCIN) 0.1 % Apply 1 application topically 2 (two) times daily. In nose   Yes [provider]  halobetasol (ULTRAVATE) 0.05 % cream Apply topically as directed for 14 days, ONCE A DAY up to 5 days a week to AFFECTED AREAS OF psoriasis on hands AS NEEDED FOR flares. Avoid face, groin, axilla 04/18/21  Yes Ralene Bathe, MD  meloxicam (MOBIC) 7.5 MG tablet Take 7.5 mg by mouth daily.   Yes [provider]   methimazole (TAPAZOLE) 5 MG tablet Take 5 mg by mouth daily.   Yes [provider]  montelukast (SINGULAIR) 10 MG tablet  04/17/19  Yes [provider]  Multiple Vitamin (MULTIVITAMIN) tablet Take 1 tablet by mouth daily.   Yes [provider]  pantoprazole (PROTONIX) 40 MG tablet Take 40 mg by mouth 2 (two) times daily.   Yes [provider]  polyethylene glycol (MIRALAX) 17 g packet Take 17 g by mouth daily. 05/15/19  Yes Billey Co, MD  senna-docusate (SENOKOT-S) 8.6-50 MG tablet Take 2 tablets by mouth 2 (two) times daily. 05/15/19  Yes Billey Co, MD  silodosin (RAPAFLO) 8 MG CAPS capsule Take 8 mg by mouth daily with breakfast.   Yes [provider]  Simethicone 180 MG CAPS Take 125 mg by mouth every 6 (six) hours as needed for flatulence. (Phazyme)   Yes [provider]  sucralfate (CARAFATE) 1 g tablet Take 1 g by mouth 3 (three) times daily with meals as needed.   Yes [provider]  Vitamin A 2400 MCG (8000 UT) TABS Take by mouth daily.   Yes [provider]  zinc gluconate 50 MG tablet Take 50 mg by mouth daily.   Yes [provider]  levocetirizine (XYZAL) 5 MG tablet Take 5 mg by mouth every evening. Patient not taking: Reported on 06/29/2021    [provider]    Allergies as of 06/08/2021 - Review Complete 04/06/2021  Allergen Reaction Noted   Ciprofloxacin Other (See Comments) 05/20/2016   Other Rash 05/20/2016   Penicillins Rash 05/20/2016   Sulfa antibiotics Rash 05/20/2016   Tetracyclines & related Other (See Comments) 05/20/2016    History reviewed. No pertinent family history.  Social History   Socioeconomic History   Marital status: Married    Spouse name: Not on file   Number of children: Not on file   Years of education: Not on file   Highest education level: Not on file  Occupational History   Not on file  Tobacco Use   Smoking status: Never   Smokeless  tobacco: Never  Vaping Use   Vaping Use: Never used  Substance and Sexual Activity   Alcohol use: Yes    Comment: socially   Drug use: No   Sexual activity: Not on file  Other Topics Concern   Not on file  Social History Narrative   Not on file   Social Determinants of Health   Financial Resource Strain: Not on file  Food Insecurity: Not on file  Transportation Needs: Not on file  Physical Activity: Not on file  Stress: Not on file  Social Connections: Not on file  Intimate Partner Violence: Not on file    Review of Systems: See HPI, otherwise negative ROS  Physical Exam: BP 129/79   Pulse (!) 59   Temp (!) 97.2 F (36.2 C) (Temporal)   Resp 20   Ht 5\' 6"  (1.676 m)  Wt 81.6 kg   SpO2 98%   BMI 29.05 kg/m  General:   Alert,  pleasant and cooperative in NAD Head:  Normocephalic and atraumatic. Lungs:  Clear to auscultation.    Heart:  Regular rate and rhythm.   Impression/Plan: Jimmy Obrien is here for ophthalmic surgery.  Risks, benefits, limitations, and alternatives regarding ophthalmic surgery have been reviewed with the patient.  Questions have been answered.  All parties agreeable.   Leandrew Koyanagi, MD  07/13/2021, 7:44 AM

## 2021-07-13 NOTE — Anesthesia Postprocedure Evaluation (Signed)
Anesthesia Post Note  Patient: Jimmy Obrien  Procedure(s) Performed: CATARACT EXTRACTION PHACO AND INTRAOCULAR LENS PLACEMENT (IOC) RIGHT 2.92 00:50.2 (Right: Eye)     Patient location during evaluation: PACU Anesthesia Type: MAC Level of consciousness: awake Pain management: pain level controlled Vital Signs Assessment: post-procedure vital signs reviewed and stable Respiratory status: respiratory function stable Cardiovascular status: stable Postop Assessment: no apparent nausea or vomiting Anesthetic complications: no   No notable events documented.  Veda Canning

## 2021-07-13 NOTE — Transfer of Care (Signed)
Immediate Anesthesia Transfer of Care Note  Patient: Jimmy Obrien  Procedure(s) Performed: CATARACT EXTRACTION PHACO AND INTRAOCULAR LENS PLACEMENT (IOC) RIGHT 2.92 00:50.2 (Right: Eye)  Patient Location: PACU  Anesthesia Type: MAC  Level of Consciousness: awake, alert  and patient cooperative  Airway and Oxygen Therapy: Patient Spontanous Breathing and Patient connected to supplemental oxygen  Post-op Assessment: Post-op Vital signs reviewed, Patient's Cardiovascular Status Stable, Respiratory Function Stable, Patent Airway and No signs of Nausea or vomiting  Post-op Vital Signs: Reviewed and stable  Complications: No notable events documented.

## 2021-07-13 NOTE — Op Note (Signed)
  LOCATION:  [place]   PREOPERATIVE DIAGNOSIS:    Nuclear sclerotic cataract right eye. H25.11   POSTOPERATIVE DIAGNOSIS:  Nuclear sclerotic cataract right eye.     PROCEDURE:  Phacoemusification with posterior chamber intraocular lens placement of the right eye   ULTRASOUND TIME: Procedure(s): CATARACT EXTRACTION PHACO AND INTRAOCULAR LENS PLACEMENT (IOC) RIGHT 2.92 00:50.2 (Right)  LENS:   Implant Name Type Inv. Item Serial No. Manufacturer Lot No. LRB No. Used Action  LENS IOL TECNIS EYHANCE 21.5 - [id number] Intraocular Lens LENS IOL TECNIS EYHANCE 21.5 [id number] JOHNSON   Right 1 Implanted         SURGEON:  Wyonia Hough, MD   ANESTHESIA:  Topical with tetracaine drops and 2% Xylocaine jelly, augmented with 1% preservative-free intracameral lidocaine.    COMPLICATIONS:  None.   DESCRIPTION OF PROCEDURE:  The patient was identified in the holding room and transported to the operating room and placed in the supine position under the operating microscope.  The right eye was identified as the operative eye and it was prepped and draped in the usual sterile ophthalmic fashion.   A 1 millimeter clear-corneal paracentesis was made at the 12:00 position.  0.5 ml of preservative-free 1% lidocaine was injected into the anterior chamber. The anterior chamber was filled with Viscoat viscoelastic.  A 2.4 millimeter keratome was used to make a near-clear corneal incision at the 9:00 position.  A curvilinear capsulorrhexis was made with a cystotome and capsulorrhexis forceps.  Balanced salt solution was used to hydrodissect and hydrodelineate the nucleus.   Phacoemulsification was then used in stop and chop fashion to remove the lens nucleus and epinucleus.  The remaining cortex was then removed using the irrigation and aspiration handpiece. Provisc was then placed into the capsular bag to distend it for lens placement.  A lens was then injected into the capsular bag.  The  remaining viscoelastic was aspirated.   Wounds were hydrated with balanced salt solution.  The anterior chamber was inflated to a physiologic pressure with balanced salt solution.  No wound leaks were noted. Vigamox 0.2 ml of a 1mg  per ml solution was injected into the anterior chamber for a dose of 0.2 mg of intracameral antibiotic at the completion of the case.   Timolol and Brimonidine drops were applied to the eye.  The patient was taken to the recovery room in stable condition without complications of anesthesia or surgery.   Courtany Mcmurphy 07/13/2021, 8:42 AM

## 2021-07-15 ENCOUNTER — Encounter: Payer: Self-pay | Admitting: Ophthalmology

## 2021-07-15 DIAGNOSIS — J301 Allergic rhinitis due to pollen: Secondary | ICD-10-CM | POA: Diagnosis not present

## 2021-07-18 ENCOUNTER — Encounter: Payer: Self-pay | Admitting: Ophthalmology

## 2021-07-18 DIAGNOSIS — H2512 Age-related nuclear cataract, left eye: Secondary | ICD-10-CM | POA: Diagnosis not present

## 2021-07-22 DIAGNOSIS — J301 Allergic rhinitis due to pollen: Secondary | ICD-10-CM | POA: Diagnosis not present

## 2021-07-25 LAB — HEMOGLOBIN A1C: Hemoglobin A1C, External: 6.7 % (ref 3.2–7.0)

## 2021-07-26 NOTE — Discharge Instructions (Signed)

## 2021-07-27 ENCOUNTER — Ambulatory Visit: Payer: PPO | Admitting: Anesthesiology

## 2021-07-27 ENCOUNTER — Encounter
Admission: RE | Disposition: A | Discharge status: Home / Self Care | Payer: Self-pay | Source: Home / Self Care | Attending: Ophthalmology

## 2021-07-27 ENCOUNTER — Encounter: Payer: Self-pay | Admitting: Ophthalmology

## 2021-07-27 ENCOUNTER — Other Ambulatory Visit: Payer: Self-pay

## 2021-07-27 ENCOUNTER — Ambulatory Visit
Admission: RE | Admit: 2021-07-27 | Discharge: 2021-07-27 | Disposition: A | Discharge status: Home / Self Care | Payer: PPO | Attending: Ophthalmology | Admitting: Ophthalmology

## 2021-07-27 DIAGNOSIS — I10 Essential (primary) hypertension: Secondary | ICD-10-CM | POA: Insufficient documentation

## 2021-07-27 DIAGNOSIS — K219 Gastro-esophageal reflux disease without esophagitis: Secondary | ICD-10-CM | POA: Insufficient documentation

## 2021-07-27 DIAGNOSIS — Z961 Presence of intraocular lens: Secondary | ICD-10-CM | POA: Insufficient documentation

## 2021-07-27 DIAGNOSIS — M1990 Unspecified osteoarthritis, unspecified site: Secondary | ICD-10-CM | POA: Insufficient documentation

## 2021-07-27 DIAGNOSIS — G4730 Sleep apnea, unspecified: Secondary | ICD-10-CM | POA: Insufficient documentation

## 2021-07-27 DIAGNOSIS — K760 Fatty (change of) liver, not elsewhere classified: Secondary | ICD-10-CM | POA: Diagnosis not present

## 2021-07-27 DIAGNOSIS — Z9841 Cataract extraction status, right eye: Secondary | ICD-10-CM | POA: Insufficient documentation

## 2021-07-27 DIAGNOSIS — H2512 Age-related nuclear cataract, left eye: Secondary | ICD-10-CM | POA: Diagnosis not present

## 2021-07-27 DIAGNOSIS — H25812 Combined forms of age-related cataract, left eye: Secondary | ICD-10-CM | POA: Diagnosis not present

## 2021-07-27 DIAGNOSIS — J45909 Unspecified asthma, uncomplicated: Secondary | ICD-10-CM | POA: Insufficient documentation

## 2021-07-27 HISTORY — PX: CATARACT EXTRACTION W/PHACO: SHX586

## 2021-07-27 SURGERY — PHACOEMULSIFICATION, CATARACT, WITH IOL INSERTION
Anesthesia: Monitor Anesthesia Care | Site: Eye | Laterality: Left

## 2021-07-27 MED ORDER — MOXIFLOXACIN HCL 0.5 % OP SOLN
OPHTHALMIC | Status: DC | PRN
  Administered 2021-07-27: 0.2 mL via OPHTHALMIC

## 2021-07-27 MED ORDER — SIGHTPATH DOSE#1 BSS IO SOLN
INTRAOCULAR | Status: DC | PRN
  Administered 2021-07-27: 64 mL via OPHTHALMIC

## 2021-07-27 MED ORDER — TETRACAINE HCL 0.5 % OP SOLN
1.0000 [drp] | OPHTHALMIC | Status: DC | PRN
  Administered 2021-07-27 (×3): 1 [drp] via OPHTHALMIC

## 2021-07-27 MED ORDER — MIDAZOLAM HCL 2 MG/2ML IJ SOLN
INTRAMUSCULAR | Status: DC | PRN
  Administered 2021-07-27: 2 mg via INTRAVENOUS

## 2021-07-27 MED ORDER — BRIMONIDINE TARTRATE-TIMOLOL 0.2-0.5 % OP SOLN
OPHTHALMIC | Status: DC | PRN
  Administered 2021-07-27: 1 [drp] via OPHTHALMIC

## 2021-07-27 MED ORDER — ACETAMINOPHEN 160 MG/5ML PO SOLN: 325.0000 mg | ORAL | Status: DC | PRN

## 2021-07-27 MED ORDER — ACETAMINOPHEN 325 MG PO TABS: 325.0000 mg | ORAL_TABLET | ORAL | Status: DC | PRN

## 2021-07-27 MED ORDER — ARMC OPHTHALMIC DILATING DROPS
1.0000 "application " | OPHTHALMIC | Status: DC | PRN
  Administered 2021-07-27 (×3): 1 via OPHTHALMIC

## 2021-07-27 MED ORDER — ONDANSETRON HCL 4 MG/2ML IJ SOLN: 4.0000 mg | Freq: Once | INTRAMUSCULAR | Status: DC | PRN

## 2021-07-27 MED ORDER — FENTANYL CITRATE (PF) 100 MCG/2ML IJ SOLN
INTRAMUSCULAR | Status: DC | PRN
  Administered 2021-07-27: 50 ug via INTRAVENOUS

## 2021-07-27 MED ORDER — SIGHTPATH DOSE#1 BSS IO SOLN
INTRAOCULAR | Status: DC | PRN
  Administered 2021-07-27: 1 mL

## 2021-07-27 MED ORDER — SIGHTPATH DOSE#1 NA HYALUR & NA CHOND-NA HYALUR IO KIT
PACK | INTRAOCULAR | Status: DC | PRN
  Administered 2021-07-27: 1 via OPHTHALMIC

## 2021-07-27 MED ORDER — SIGHTPATH DOSE#1 BSS IO SOLN
INTRAOCULAR | Status: DC | PRN
  Administered 2021-07-27: 15 mL

## 2021-07-27 SURGICAL SUPPLY — 16 items
CANNULA ANT/CHMB 27GA (MISCELLANEOUS) IMPLANT
GLOVE SRG 8 PF TXTR STRL LF DI (GLOVE) ×1 IMPLANT
GLOVE SURG ENC TEXT LTX SZ7.5 (GLOVE) ×3 IMPLANT
GLOVE SURG UNDER POLY LF SZ8 (GLOVE) ×3
GOWN STRL REUS W/ TWL LRG LVL3 (GOWN DISPOSABLE) ×2 IMPLANT
GOWN STRL REUS W/TWL LRG LVL3 (GOWN DISPOSABLE) ×6
LENS IOL TECNIS EYHANCE 20.5 (Intraocular Lens) ×3 IMPLANT
MARKER SKIN DUAL TIP RULER LAB (MISCELLANEOUS) ×3 IMPLANT
NEEDLE CAPSULORHEX 25GA (NEEDLE) IMPLANT
NEEDLE FILTER BLUNT 18X 1/2SAF (NEEDLE) ×4
NEEDLE FILTER BLUNT 18X1 1/2 (NEEDLE) ×2 IMPLANT
PACK EYE AFTER SURG (MISCELLANEOUS) IMPLANT
SYR 3ML LL SCALE MARK (SYRINGE) ×6 IMPLANT
SYR TB 1ML LUER SLIP (SYRINGE) ×3 IMPLANT
WATER STERILE IRR 250ML POUR (IV SOLUTION) ×3 IMPLANT
WIPE NON LINTING 3.25X3.25 (MISCELLANEOUS) ×3 IMPLANT

## 2021-07-27 NOTE — H&P (Signed)
. [place]   Primary Care Physician:  Baxter Hire, MD Ophthalmologist: Dr. Leandrew Koyanagi  Pre-Procedure History & Physical: HPI:  Jimmy Obrien is a 75 y.o. male here for ophthalmic surgery.   Past Medical History:  Diagnosis Date   Asthma    exercise induced   BPH (benign prostatic hyperplasia)    Diverticulitis    Diverticulosis    Fatty liver    GERD (gastroesophageal reflux disease)    History of kidney stones    Hx of atrial fibrillation without current medication    Hypertension    Hyperthyroidism    Multinodular Goiter   Sleep apnea    No CPAP   Thrombocytopenia ([place])     Past Surgical History:  Procedure Laterality Date   CATARACT EXTRACTION W/PHACO Right 07/13/2021   Procedure: CATARACT EXTRACTION PHACO AND INTRAOCULAR LENS PLACEMENT ([place]) RIGHT 2.92 00:50.2;  Surgeon: Leandrew Koyanagi, MD;  Location: [facility];  Service: Ophthalmology;  Laterality: Right;   CHOLECYSTECTOMY     COLON RESECTION     COLONOSCOPY WITH PROPOFOL N/A 03/31/2019   Procedure: COLONOSCOPY WITH PROPOFOL;  Surgeon: Toledo, Benay Pike, MD;  Location: [facility] ENDOSCOPY;  Service: Endoscopy;  Laterality: N/A;   ESOPHAGOGASTRODUODENOSCOPY (EGD) WITH PROPOFOL N/A 08/16/2016   Procedure: ESOPHAGOGASTRODUODENOSCOPY (EGD) WITH PROPOFOL;  Surgeon: Manya Silvas, MD;  Location: [facility] ENDOSCOPY;  Service: Endoscopy;  Laterality: N/A;   ESOPHAGOGASTRODUODENOSCOPY (EGD) WITH PROPOFOL N/A 03/31/2019   Procedure: ESOPHAGOGASTRODUODENOSCOPY (EGD) WITH PROPOFOL;  Surgeon: Toledo, Benay Pike, MD;  Location: [facility] ENDOSCOPY;  Service: Endoscopy;  Laterality: N/A;   EXTRACORPOREAL SHOCK WAVE LITHOTRIPSY Left 05/15/2019   Procedure: EXTRACORPOREAL SHOCK WAVE LITHOTRIPSY (ESWL);  Surgeon: Billey Co, MD;  Location: [facility] ORS;  Service: Urology;  Laterality: Left;   TRANSURETHRAL RESECTION OF PROSTATE      Prior to Admission medications   Medication Sig Start Date End Date Taking?  Authorizing Provider  acetaminophen (TYLENOL) 650 MG CR tablet Take 1,300 mg by mouth 2 (two) times daily.   Yes [provider]  albuterol (PROVENTIL HFA;VENTOLIN HFA) 108 (90 Base) MCG/ACT inhaler Inhale 2 puffs into the lungs every 6 (six) hours as needed for wheezing or shortness of breath.   Yes [provider]  amphetamine-dextroamphetamine (ADDERALL) 10 MG tablet Take 10 mg by mouth daily with breakfast.   Yes [provider]  Ascorbic Acid (VITAMIN C) 1000 MG tablet Take 1,000 mg by mouth 2 (two) times daily.   Yes [provider]  aspirin EC 81 MG tablet Take 81 mg by mouth daily.   Yes [provider]  atenolol (TENORMIN) 25 MG tablet Take 12.5 mg by mouth daily.   Yes [provider]  azelastine (ASTELIN) 0.1 % nasal spray Place 1 spray into both nostrils 2 (two) times daily. Use in each nostril as directed   Yes [provider]  budesonide (RHINOCORT AQUA) 32 MCG/ACT nasal spray Place 1 spray into both nostrils daily.   Yes [provider]  Camphor-Menthol-Methyl Sal (FLEXALL ULTRA PLUS EX) Apply 1 application topically as needed.   Yes [provider]  Cholecalciferol 10000 units CAPS Take 2,000 Units by mouth 1 day or 1 dose.   Yes [provider]  citalopram (CELEXA) 10 MG tablet  04/02/19  Yes [provider]  cyanocobalamin 1000 MCG tablet Take 1,000 mcg by mouth in the morning and at bedtime.   Yes [provider]  diazepam (VALIUM) 5 MG tablet Take 5 mg  by mouth 2 (two) times daily as needed.   Yes [provider]  DUTASTERIDE-TAMSULOSIN HCL PO Take by mouth daily.   Yes [provider]  FLOVENT HFA 110 MCG/ACT inhaler  04/17/19  Yes [provider]  gentamicin ointment (GARAMYCIN) 0.1 % Apply 1 application topically 2 (two) times daily. In nose   Yes [provider]  halobetasol (ULTRAVATE) 0.05 % cream Apply topically as directed for 14  days, ONCE A DAY up to 5 days a week to AFFECTED AREAS OF psoriasis on hands AS NEEDED FOR flares. Avoid face, groin, axilla 04/18/21  Yes Ralene Bathe, MD  meloxicam (MOBIC) 7.5 MG tablet Take 7.5 mg by mouth daily.   Yes [provider]  methimazole (TAPAZOLE) 5 MG tablet Take 5 mg by mouth daily.   Yes [provider]  montelukast (SINGULAIR) 10 MG tablet  04/17/19  Yes [provider]  Multiple Vitamin (MULTIVITAMIN) tablet Take 1 tablet by mouth daily.   Yes [provider]  pantoprazole (PROTONIX) 40 MG tablet Take 40 mg by mouth 2 (two) times daily.   Yes [provider]  polyethylene glycol (MIRALAX) 17 g packet Take 17 g by mouth daily. 05/15/19  Yes Billey Co, MD  senna-docusate (SENOKOT-S) 8.6-50 MG tablet Take 2 tablets by mouth 2 (two) times daily. 05/15/19  Yes Billey Co, MD  silodosin (RAPAFLO) 8 MG CAPS capsule Take 8 mg by mouth daily with breakfast.   Yes [provider]  Simethicone 180 MG CAPS Take 125 mg by mouth every 6 (six) hours as needed for flatulence. (Phazyme)   Yes [provider]  sucralfate (CARAFATE) 1 g tablet Take 1 g by mouth 3 (three) times daily with meals as needed.   Yes [provider]  Vitamin A 2400 MCG (8000 UT) TABS Take by mouth daily.   Yes [provider]  zinc gluconate 50 MG tablet Take 50 mg by mouth daily.   Yes [provider]  levocetirizine (XYZAL) 5 MG tablet Take 5 mg by mouth every evening. Patient not taking: Reported on 06/29/2021    [provider]    Allergies as of 06/08/2021 - Review Complete 04/06/2021  Allergen Reaction Noted   Ciprofloxacin Other (See Comments) 05/20/2016   Other Rash 05/20/2016   Penicillins Rash 05/20/2016   Sulfa antibiotics Rash 05/20/2016   Tetracyclines & related Other (See Comments) 05/20/2016    History reviewed. No pertinent family history.  Social History   Socioeconomic History    Marital status: Married    Spouse name: Not on file   Number of children: Not on file   Years of education: Not on file   Highest education level: Not on file  Occupational History   Not on file  Tobacco Use   Smoking status: Never   Smokeless tobacco: Never  Vaping Use   Vaping Use: Never used  Substance and Sexual Activity   Alcohol use: Yes    Comment: socially   Drug use: No   Sexual activity: Not on file  Other Topics Concern   Not on file  Social History Narrative   Not on file   Social Determinants of Health   Financial Resource Strain: Not on file  Food Insecurity: Not on file  Transportation Needs: Not on file  Physical Activity: Not on file  Stress: Not on file  Social Connections: Not on file  Intimate Partner Violence: Not on file    Review of  Systems: See HPI, otherwise negative ROS  Physical Exam: BP 134/73   Pulse 74   Temp 97.7 F (36.5 C)   Resp 18   Ht 5\' 6"  (1.676 m)   Wt 81.6 kg   SpO2 98%   BMI 29.04 kg/m  General:   Alert,  pleasant and cooperative in NAD Head:  Normocephalic and atraumatic. Lungs:  Clear to auscultation.    Heart:  Regular rate and rhythm.   Impression/Plan: [name] is here for ophthalmic surgery.  Risks, benefits, limitations, and alternatives regarding ophthalmic surgery have been reviewed with the patient.  Questions have been answered.  All parties agreeable.   Leandrew Koyanagi, MD  07/27/2021, 8:16 AM

## 2021-07-27 NOTE — Anesthesia Procedure Notes (Signed)
Procedure Name: MAC Date/Time: 07/27/2021 8:53 AM Performed by: Jeannene Patella, CRNA Pre-anesthesia Checklist: Patient identified, Emergency Drugs available, Suction available, Timeout performed and Patient being monitored Patient Re-evaluated:Patient Re-evaluated prior to induction Oxygen Delivery Method: Nasal cannula Placement Confirmation: positive ETCO2

## 2021-07-27 NOTE — Transfer of Care (Signed)
Immediate Anesthesia Transfer of Care Note  Patient: Jimmy Obrien  Procedure(s) Performed: CATARACT EXTRACTION PHACO AND INTRAOCULAR LENS PLACEMENT ([facility]) LEFT 3.67 00:39.7 (Left: Eye)  Patient Location: PACU  Anesthesia Type: MAC  Level of Consciousness: awake, alert  and patient cooperative  Airway and Oxygen Therapy: Patient Spontanous Breathing and Patient connected to supplemental oxygen  Post-op Assessment: Post-op Vital signs reviewed, Patient's Cardiovascular Status Stable, Respiratory Function Stable, Patent Airway and No signs of Nausea or vomiting  Post-op Vital Signs: Reviewed and stable  Complications: No notable events documented.

## 2021-07-27 NOTE — Anesthesia Postprocedure Evaluation (Signed)
Anesthesia Post Note  Patient: Jimmy Obrien  Procedure(s) Performed: CATARACT EXTRACTION PHACO AND INTRAOCULAR LENS PLACEMENT (I[facility]) LEFT 3.67 00:39.7 (Left: Eye)     Patient location during evaluation: PACU Anesthesia Type: MAC Level of consciousness: awake Pain management: pain level controlled Vital Signs Assessment: post-procedure vital signs reviewed and stable Respiratory status: respiratory function stable Cardiovascular status: stable Postop Assessment: no apparent nausea or vomiting Anesthetic complications: no   No notable events documented.  Veda Canning

## 2021-07-27 NOTE — Anesthesia Preprocedure Evaluation (Signed)
Anesthesia Evaluation  Patient identified by MRN, date of birth, ID band Patient awake    Reviewed: Allergy & Precautions, NPO status   Airway Mallampati: II  TM Distance: >3 FB     Dental   Pulmonary asthma , sleep apnea (no cpap) ,    Pulmonary exam normal        Cardiovascular hypertension,  Rhythm:Regular Rate:Normal     Neuro/Psych    GI/Hepatic GERD  ,Fatty liver   Endo/Other    Renal/GU      Musculoskeletal  (+) Arthritis ,   Abdominal   Peds  Hematology   Anesthesia Other Findings   Reproductive/Obstetrics                             Anesthesia Physical  Anesthesia Plan  ASA: 3  Anesthesia Plan: MAC   Post-op Pain Management:    Induction: Intravenous  PONV Risk Score and Plan: TIVA, Midazolam and Treatment may vary due to age or medical condition  Airway Management Planned: Natural Airway and Nasal Cannula  Additional Equipment:   Intra-op Plan:   Post-operative Plan:   Informed Consent: I have reviewed the patients History and Physical, chart, labs and discussed the procedure including the risks, benefits and alternatives for the proposed anesthesia with the patient or authorized representative who has indicated his/her understanding and acceptance.       Plan Discussed with: CRNA  Anesthesia Plan Comments:         Anesthesia Quick Evaluation

## 2021-07-27 NOTE — Op Note (Signed)
  OPERATIVE NOTE  Jimmy Obrien [id number] 07/27/2021   PREOPERATIVE DIAGNOSIS:  Nuclear sclerotic cataract left eye. H25.12   POSTOPERATIVE DIAGNOSIS:    Nuclear sclerotic cataract left eye.     PROCEDURE:  Phacoemusification with posterior chamber intraocular lens placement of the left eye  Ultrasound time: Procedure(s): CATARACT EXTRACTION PHACO AND INTRAOCULAR LENS PLACEMENT (IOC) LEFT 3.67 00:39.7 (Left)  LENS:   Implant Name Type Inv. Item Serial No. Manufacturer Lot No. LRB No. Used Action  LENS IOL TECNIS EYHANCE 20.5 - [id number] Intraocular Lens LENS IOL TECNIS EYHANCE 20.5 [id number] JOHNSON   Left 1 Implanted      SURGEON:  Wyonia Hough, MD   ANESTHESIA:  Topical with tetracaine drops and 2% Xylocaine jelly, augmented with 1% preservative-free intracameral lidocaine.    COMPLICATIONS:  None.   DESCRIPTION OF PROCEDURE:  The patient was identified in the holding room and transported to the operating room and placed in the supine position under the operating microscope.  The left eye was identified as the operative eye and it was prepped and draped in the usual sterile ophthalmic fashion.   A 1 millimeter clear-corneal paracentesis was made at the 1:30 position.  0.5 ml of preservative-free 1% lidocaine was injected into the anterior chamber.  The anterior chamber was filled with Viscoat viscoelastic.  A 2.4 millimeter keratome was used to make a near-clear corneal incision at the 10:30 position.  .  A curvilinear capsulorrhexis was made with a cystotome and capsulorrhexis forceps.  Balanced salt solution was used to hydrodissect and hydrodelineate the nucleus.   Phacoemulsification was then used in stop and chop fashion to remove the lens nucleus and epinucleus.  The remaining cortex was then removed using the irrigation and aspiration handpiece. Provisc was then placed into the capsular bag to distend it for lens placement.  A lens was then injected into the  capsular bag.  The remaining viscoelastic was aspirated.   Wounds were hydrated with balanced salt solution.  The anterior chamber was inflated to a physiologic pressure with balanced salt solution.  No wound leaks were noted. Vigamox 0.2 ml of a 1mg  per ml solution was injected into the anterior chamber for a dose of 0.2 mg of intracameral antibiotic at the completion of the case.   Timolol and Brimonidine drops were applied to the eye.  The patient was taken to the recovery room in stable condition without complications of anesthesia or surgery.  Jimmy Obrien 07/27/2021, 9:09 AM

## 2021-07-28 ENCOUNTER — Encounter: Payer: Self-pay | Admitting: Ophthalmology

## 2021-07-29 DIAGNOSIS — J301 Allergic rhinitis due to pollen: Secondary | ICD-10-CM | POA: Diagnosis not present

## 2021-08-02 DIAGNOSIS — M9902 Segmental and somatic dysfunction of thoracic region: Secondary | ICD-10-CM | POA: Diagnosis not present

## 2021-08-02 DIAGNOSIS — M5033 Other cervical disc degeneration, cervicothoracic region: Secondary | ICD-10-CM | POA: Diagnosis not present

## 2021-08-02 DIAGNOSIS — M9901 Segmental and somatic dysfunction of cervical region: Secondary | ICD-10-CM | POA: Diagnosis not present

## 2021-08-02 DIAGNOSIS — R519 Headache, unspecified: Secondary | ICD-10-CM | POA: Diagnosis not present

## 2021-08-11 DIAGNOSIS — I480 Paroxysmal atrial fibrillation: Secondary | ICD-10-CM | POA: Diagnosis not present

## 2021-08-11 DIAGNOSIS — I10 Essential (primary) hypertension: Secondary | ICD-10-CM | POA: Diagnosis not present

## 2021-08-11 DIAGNOSIS — Z125 Encounter for screening for malignant neoplasm of prostate: Secondary | ICD-10-CM | POA: Diagnosis not present

## 2021-08-11 DIAGNOSIS — Z0001 Encounter for general adult medical examination with abnormal findings: Secondary | ICD-10-CM | POA: Diagnosis not present

## 2021-08-11 DIAGNOSIS — R7303 Prediabetes: Secondary | ICD-10-CM | POA: Diagnosis not present

## 2021-08-18 DIAGNOSIS — I10 Essential (primary) hypertension: Secondary | ICD-10-CM | POA: Diagnosis not present

## 2021-08-18 DIAGNOSIS — Z0000 Encounter for general adult medical examination without abnormal findings: Secondary | ICD-10-CM | POA: Diagnosis not present

## 2021-08-18 DIAGNOSIS — N400 Enlarged prostate without lower urinary tract symptoms: Secondary | ICD-10-CM | POA: Diagnosis not present

## 2021-08-18 DIAGNOSIS — G4733 Obstructive sleep apnea (adult) (pediatric): Secondary | ICD-10-CM | POA: Diagnosis not present

## 2021-08-18 DIAGNOSIS — Z23 Encounter for immunization: Secondary | ICD-10-CM | POA: Diagnosis not present

## 2021-08-18 DIAGNOSIS — Z0001 Encounter for general adult medical examination with abnormal findings: Secondary | ICD-10-CM | POA: Diagnosis not present

## 2021-08-18 DIAGNOSIS — R7303 Prediabetes: Secondary | ICD-10-CM | POA: Diagnosis not present

## 2021-08-18 DIAGNOSIS — K219 Gastro-esophageal reflux disease without esophagitis: Secondary | ICD-10-CM | POA: Diagnosis not present

## 2021-08-18 DIAGNOSIS — E559 Vitamin D deficiency, unspecified: Secondary | ICD-10-CM | POA: Diagnosis not present

## 2021-08-18 DIAGNOSIS — I480 Paroxysmal atrial fibrillation: Secondary | ICD-10-CM | POA: Diagnosis not present

## 2021-08-18 DIAGNOSIS — D696 Thrombocytopenia, unspecified: Secondary | ICD-10-CM | POA: Diagnosis not present

## 2021-08-18 DIAGNOSIS — E039 Hypothyroidism, unspecified: Secondary | ICD-10-CM | POA: Diagnosis not present

## 2021-08-18 DIAGNOSIS — Z125 Encounter for screening for malignant neoplasm of prostate: Secondary | ICD-10-CM | POA: Diagnosis not present

## 2021-08-19 DIAGNOSIS — J301 Allergic rhinitis due to pollen: Secondary | ICD-10-CM | POA: Diagnosis not present

## 2021-08-26 DIAGNOSIS — J301 Allergic rhinitis due to pollen: Secondary | ICD-10-CM | POA: Diagnosis not present

## 2021-08-30 DIAGNOSIS — M5033 Other cervical disc degeneration, cervicothoracic region: Secondary | ICD-10-CM | POA: Diagnosis not present

## 2021-08-30 DIAGNOSIS — M9902 Segmental and somatic dysfunction of thoracic region: Secondary | ICD-10-CM | POA: Diagnosis not present

## 2021-08-30 DIAGNOSIS — R519 Headache, unspecified: Secondary | ICD-10-CM | POA: Diagnosis not present

## 2021-08-30 DIAGNOSIS — M9901 Segmental and somatic dysfunction of cervical region: Secondary | ICD-10-CM | POA: Diagnosis not present

## 2021-09-02 DIAGNOSIS — J301 Allergic rhinitis due to pollen: Secondary | ICD-10-CM | POA: Diagnosis not present

## 2021-09-13 ENCOUNTER — Other Ambulatory Visit: Payer: Self-pay | Admitting: Dermatology

## 2021-09-13 DIAGNOSIS — L03012 Cellulitis of left finger: Secondary | ICD-10-CM | POA: Diagnosis not present

## 2021-09-13 DIAGNOSIS — L409 Psoriasis, unspecified: Secondary | ICD-10-CM

## 2021-09-13 DIAGNOSIS — L03011 Cellulitis of right finger: Secondary | ICD-10-CM | POA: Diagnosis not present

## 2021-09-23 DIAGNOSIS — J301 Allergic rhinitis due to pollen: Secondary | ICD-10-CM | POA: Diagnosis not present

## 2021-09-27 DIAGNOSIS — R519 Headache, unspecified: Secondary | ICD-10-CM | POA: Diagnosis not present

## 2021-09-27 DIAGNOSIS — M9901 Segmental and somatic dysfunction of cervical region: Secondary | ICD-10-CM | POA: Diagnosis not present

## 2021-09-27 DIAGNOSIS — M5033 Other cervical disc degeneration, cervicothoracic region: Secondary | ICD-10-CM | POA: Diagnosis not present

## 2021-09-27 DIAGNOSIS — M9902 Segmental and somatic dysfunction of thoracic region: Secondary | ICD-10-CM | POA: Diagnosis not present

## 2021-09-30 ENCOUNTER — Encounter: Payer: MEDICARE | Attending: Urology | Primary: Internal Medicine

## 2021-09-30 ENCOUNTER — Encounter: Attending: Urology | Primary: Internal Medicine

## 2021-09-30 DIAGNOSIS — J301 Allergic rhinitis due to pollen: Secondary | ICD-10-CM | POA: Diagnosis not present

## 2021-10-05 DIAGNOSIS — J301 Allergic rhinitis due to pollen: Secondary | ICD-10-CM | POA: Diagnosis not present

## 2021-10-07 DIAGNOSIS — J301 Allergic rhinitis due to pollen: Secondary | ICD-10-CM | POA: Diagnosis not present

## 2021-10-14 DIAGNOSIS — J301 Allergic rhinitis due to pollen: Secondary | ICD-10-CM | POA: Diagnosis not present

## 2021-10-20 ENCOUNTER — Other Ambulatory Visit: Payer: Self-pay

## 2021-10-20 ENCOUNTER — Ambulatory Visit: Payer: PPO | Admitting: Dermatology

## 2021-10-20 DIAGNOSIS — L710 Perioral dermatitis: Secondary | ICD-10-CM | POA: Diagnosis not present

## 2021-10-20 MED ORDER — DOXYCYCLINE HYCLATE 50 MG PO CAPS: 50.0000 mg | ORAL_CAPSULE | Freq: Every day | ORAL | 1 refills | Status: DC

## 2021-10-20 MED ORDER — DAPSONE 7.5 % EX GEL: CUTANEOUS | 2 refills | Status: DC

## 2021-10-20 NOTE — Patient Instructions (Signed)

## 2021-10-20 NOTE — Progress Notes (Signed)
° °  Follow-Up Visit   Subjective  Jimmy Obrien is a 76 y.o. male who presents for the following: Rash (6 months f/u on rash on his face, treating with Skin medicinals triple cream once a day, using otc Peroxide to clean face daily). Pt has sulfa allergy.  The following portions of the chart were reviewed this encounter and updated as appropriate:   Tobacco   Allergies   Meds   Problems   Med Hx   Surg Hx   Fam Hx      Review of Systems:  No other skin or systemic complaints except as noted in HPI or Assessment and Plan.  Objective  Well appearing patient in no apparent distress; mood and affect are within normal limits.  A focused examination was performed including face. Relevant physical exam findings are noted in the Assessment and Plan.  face Erythema infranasal and oral commissure       Assessment & Plan  Perioral dermatitis face Perioral dermatitis/Rosacea  Chronic and persistent condition with duration or expected duration over one year. Condition is bothersome/symptomatic for patient. Currently flared.   Start Aczone gel apply to face twice a day   Start Doxycyline 50 mg take 1 tablet  Doxycycline should be taken with food to prevent nausea. Do not lay down for 30 minutes after taking. Be cautious with sun exposure and use good sun protection while on this medication. Pregnant women should not take this medication.    Pt has a allergy to Tetracycline but he report he can take Doxycycline with no issues.   Pt has an allergy to Sulfa drugs   Rosacea is a chronic progressive skin condition usually affecting the face of adults, causing redness and/or acne bumps. It is treatable but not curable. It sometimes affects the eyes (ocular rosacea) as well. It may respond to topical and/or systemic medication and can flare with stress, sun exposure, alcohol, exercise and some foods.  Daily application of broad spectrum spf 30+ sunscreen to face is recommended to reduce  flares.  Related Medications Dapsone (ACZONE) 7.5 % GEL Apply to face twice a day doxycycline (VIBRAMYCIN) 50 MG capsule Take 1 capsule (50 mg total) by mouth daily.  Return in about 2 months (around 12/18/2021) for Perioral dermatitis .  IMarye Round, CMA, am acting as scribe for Sarina Ser, MD .  Documentation: I have reviewed the above documentation for accuracy and completeness, and I agree with the above.  Sarina Ser, MD

## 2021-10-21 DIAGNOSIS — J301 Allergic rhinitis due to pollen: Secondary | ICD-10-CM | POA: Diagnosis not present

## 2021-10-24 DIAGNOSIS — K5909 Other constipation: Secondary | ICD-10-CM | POA: Diagnosis not present

## 2021-10-24 DIAGNOSIS — K219 Gastro-esophageal reflux disease without esophagitis: Secondary | ICD-10-CM | POA: Diagnosis not present

## 2021-10-24 DIAGNOSIS — Z86010 Personal history of colonic polyps: Secondary | ICD-10-CM | POA: Diagnosis not present

## 2021-10-25 ENCOUNTER — Encounter: Payer: Self-pay | Admitting: Dermatology

## 2021-10-25 DIAGNOSIS — M9901 Segmental and somatic dysfunction of cervical region: Secondary | ICD-10-CM | POA: Diagnosis not present

## 2021-10-25 DIAGNOSIS — M9903 Segmental and somatic dysfunction of lumbar region: Secondary | ICD-10-CM | POA: Diagnosis not present

## 2021-10-25 DIAGNOSIS — R519 Headache, unspecified: Secondary | ICD-10-CM | POA: Diagnosis not present

## 2021-10-25 DIAGNOSIS — M542 Cervicalgia: Secondary | ICD-10-CM | POA: Diagnosis not present

## 2021-10-28 DIAGNOSIS — J301 Allergic rhinitis due to pollen: Secondary | ICD-10-CM | POA: Diagnosis not present

## 2021-10-28 LAB — HEMOGLOBIN A1C: Hemoglobin A1C, External: 7 % (ref 3.2–7.0)

## 2021-11-01 DIAGNOSIS — E0520 Thyrotoxicosis with toxic multinodular goiter without thyrotoxic crisis or storm: Secondary | ICD-10-CM | POA: Diagnosis not present

## 2021-11-04 DIAGNOSIS — J301 Allergic rhinitis due to pollen: Secondary | ICD-10-CM | POA: Diagnosis not present

## 2021-11-11 DIAGNOSIS — J301 Allergic rhinitis due to pollen: Secondary | ICD-10-CM | POA: Diagnosis not present

## 2021-11-18 ENCOUNTER — Encounter: Admit: 2021-11-18 | Discharge: 2021-11-18 | Payer: MEDICARE | Attending: Adult Health | Primary: Internal Medicine

## 2021-11-18 DIAGNOSIS — J301 Allergic rhinitis due to pollen: Secondary | ICD-10-CM | POA: Diagnosis not present

## 2021-11-18 DIAGNOSIS — R1032 Left lower quadrant pain: Secondary | ICD-10-CM

## 2021-11-18 LAB — AMB POC URINALYSIS DIP STICK AUTO W/O MICRO
Glucose, Urine, POC: NEGATIVE
KETONES, Urine, POC: NEGATIVE
Leukocyte Esterase, Urine, POC: NEGATIVE
Nitrite, Urine, POC: NEGATIVE
Protein, Urine, POC: NEGATIVE
Specific Gravity, Urine, POC: 1.03 (ref 1.001–1.035)
Urobilinogen, POC: 0.2
pH, Urine, POC: 6 (ref 4.6–8.0)

## 2021-11-18 NOTE — Progress Notes (Signed)
[facility] [place] [place] [address]  [phone]          Jason Fernandez  DOB: [birth year]    Chief Complaint   Patient presents with    Follow-up    Prostate Cancer    Hematuria          HPI     Jason Fernandez is a 76 y.o. male    Significant history of prostate cancer and BPH.  No longer on Flomax or Proscar.  The stream is slow at times with mild daytime frequency.  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  Patient had brachytherapy for his prostate cancer completed this in 11/2002.  PSA remains stable with his last PSA being 0.094.  This was done 09/16/2020.     Patient is accompanied by his wife today.    He had 2 episodes of painless gross hematuria in the last month. No symptoms. Had CT 1 year ago and it was good.        Past Medical History:   Diagnosis Date    Hypercholesterolemia     Hypertension     Personal history of prostate cancer      Past Surgical History:   Procedure Laterality Date    OTHER SURGICAL HISTORY      brachytherapy 3/04     Current Outpatient Medications   Medication Sig Dispense Refill    aspirin 81 MG EC tablet Take 325 mg by mouth daily      Cholecalciferol 50 MCG (2000 UT) CAPS Take by mouth 2 times daily      donepezil (ARICEPT) 5 MG tablet Take by mouth      doxycycline monohydrate (MONODOX) 100 MG capsule Take 100 mg by mouth 2 times daily      finasteride (PROSCAR) 5 MG tablet Take 5 mg by mouth daily      FLUoxetine HCl, PMDD, 20 MG TABS Take 20 mg by mouth daily       No current facility-administered medications for this visit.     Allergies   Allergen Reactions    Lisinopril Hives    Meloxicam Hives    Sulfa Antibiotics Rash     Social History     Socioeconomic History    Marital status: Married     Spouse name: Not on file    Number of children: Not on file    Years of education: Not on file    Highest education level: Not on file   Occupational History    Not on file   Tobacco Use    Smoking status: Never    Smokeless tobacco:  Not on file   Substance and Sexual Activity    Alcohol use: No    Drug use: Not on file    Sexual activity: Not on file   Other Topics Concern    Not on file   Social History Narrative    Not on file     Social Determinants of Health     Financial Resource Strain: Not on file   Food Insecurity: Not on file   Transportation Needs: Not on file   Physical Activity: Not on file   Stress: Not on file   Social Connections: Not on file   Intimate Partner Violence: Not on file   Housing Stability: Not on file     No family history on file.    Review of Systems  Constitutional:  Negative for fever.  GI:  Negative for nausea.  Genitourinary: Positive for hematuria.  Musculoskeletal:  Negative for back pain.    Urinalysis  UA - Dipstick  Results for orders placed or performed in visit on 11/18/21   AMB POC URINALYSIS DIP STICK AUTO W/O MICRO   Result Value Ref Range    Color (UA POC)      Clarity (UA POC)      Glucose, Urine, POC Negative Negative    Bilirubin, Urine, POC Small Negative    KETONES, Urine, POC Negative Negative    Specific Gravity, Urine, POC 1.030 1.001 - 1.035    Blood (UA POC) Trace-intact Negative    pH, Urine, POC 6.0 4.6 - 8.0    Protein, Urine, POC Negative Negative    Urobilinogen, POC 0.2 mg/dL     Nitrite, Urine, POC Negative Negative    Leukocyte Esterase, Urine, POC Negative Negative     PHYSICAL EXAM    GENERAL: No acute distress, Awake, Alert, Oriented X 3, Gait normal  ABDOMEN: soft, non tender, non-distended, positive bowel sounds, no organomegaly, no palpable masses, no guarding, no rebound tenderness  SKIN: No rash, no erythema, no lacerations or abrasions, no ecchymosis  MUSCULOSKELETAL - MAEW, no edema     Assessment and Plan    ICD-10-CM    1. Left lower quadrant pain  R10.32       2. Malignant neoplasm of prostate (HCC)  C61 AMB POC URINALYSIS DIP STICK AUTO W/O MICRO     PSA, ultrasensitive     PSA, ultrasensitive      3. Gross hematuria  R31.0         Prostate cancer-  PSA will be  obtained today.     Gross hematuria-  Return for cystoscopy. He had CT last year.  We will hold on this. Can repeat if MD wishes to do so.     Curlene Labrum, APRN - NP    Return for appt has been made print AVS.  Elements of this note have been dictated using speech recognition software.  Although reviewed, errors of speech recognition may have occurred.

## 2021-11-20 LAB — PSA, ULTRASENSITIVE: PSA, Ultrasensitive: 0.157 ng/mL (ref 0.000–4.000)

## 2021-11-22 DIAGNOSIS — M542 Cervicalgia: Secondary | ICD-10-CM | POA: Diagnosis not present

## 2021-11-22 DIAGNOSIS — M9903 Segmental and somatic dysfunction of lumbar region: Secondary | ICD-10-CM | POA: Diagnosis not present

## 2021-11-22 DIAGNOSIS — M9901 Segmental and somatic dysfunction of cervical region: Secondary | ICD-10-CM | POA: Diagnosis not present

## 2021-11-22 DIAGNOSIS — R519 Headache, unspecified: Secondary | ICD-10-CM | POA: Diagnosis not present

## 2021-11-25 DIAGNOSIS — J301 Allergic rhinitis due to pollen: Secondary | ICD-10-CM | POA: Diagnosis not present

## 2021-12-08 NOTE — Telephone Encounter (Signed)
Called patient lvm  he has a cysto scheduled at 2:45 in [place] on 12/09/2021 I r/s the time to 10:15. If he calls back and needs to rs please cancel apt and send me a message and i'll get it rescheduled.

## 2021-12-09 ENCOUNTER — Encounter: Payer: MEDICARE | Attending: Urology | Primary: Internal Medicine

## 2021-12-09 DIAGNOSIS — J301 Allergic rhinitis due to pollen: Secondary | ICD-10-CM | POA: Diagnosis not present

## 2021-12-12 ENCOUNTER — Other Ambulatory Visit: Payer: Self-pay | Admitting: Dermatology

## 2021-12-12 DIAGNOSIS — L719 Rosacea, unspecified: Secondary | ICD-10-CM

## 2021-12-13 DIAGNOSIS — M542 Cervicalgia: Secondary | ICD-10-CM | POA: Diagnosis not present

## 2021-12-13 DIAGNOSIS — M9903 Segmental and somatic dysfunction of lumbar region: Secondary | ICD-10-CM | POA: Diagnosis not present

## 2021-12-13 DIAGNOSIS — M9901 Segmental and somatic dysfunction of cervical region: Secondary | ICD-10-CM | POA: Diagnosis not present

## 2021-12-13 DIAGNOSIS — R519 Headache, unspecified: Secondary | ICD-10-CM | POA: Diagnosis not present

## 2021-12-22 ENCOUNTER — Ambulatory Visit: Payer: PPO | Admitting: Dermatology

## 2021-12-22 DIAGNOSIS — L820 Inflamed seborrheic keratosis: Secondary | ICD-10-CM

## 2021-12-22 DIAGNOSIS — L710 Perioral dermatitis: Secondary | ICD-10-CM | POA: Diagnosis not present

## 2021-12-22 MED ORDER — DOXYCYCLINE HYCLATE 20 MG PO TABS: 20.0000 mg | ORAL_TABLET | Freq: Every day | ORAL | 3 refills | Status: DC

## 2021-12-22 NOTE — Progress Notes (Signed)
? ?  Follow-Up Visit ?  ?Subjective  ?Jimmy Obrien is a 76 y.o. male who presents for the following: Perioral dermatitis (Face, clear today, Finished Doxycycline '50mg'$ , Aczone didn't help) and check spot (Nose, ~51m not cleared). ?The patient has spots, moles and lesions to be evaluated, some may be new or changing and the patient has concerns that these could be cancer. ? ?The following portions of the chart were reviewed this encounter and updated as appropriate:  ? Tobacco  Allergies  Meds  Problems  Med Hx  Surg Hx  Fam Hx   ?  ?Review of Systems:  No other skin or systemic complaints except as noted in HPI or Assessment and Plan. ? ?Objective  ?Well appearing patient in no apparent distress; mood and affect are within normal limits. ? ?A focused examination was performed including face. Relevant physical exam findings are noted in the Assessment and Plan. ? ?perinasal, perioral ?Face clear today ? ?L nose x 1 ?Stuck on waxy lobulated pap with erythema  ? ? ?Assessment & Plan  ?Perioral dermatitis ?perinasal, perioral ? ?Perioral dermatitis is an eruption which is usually located around the mouth and nose.  It can be a rash and/or red bumps.  It occasionally occurs around the eyes.  It may be itchy and may burn.  The exact cause is unknown.  Some types of makeup, moisturizers, dental products, and prescription creams may be partially responsible for the eruption.  Topical steroids such as cortisone creams can temporarily make the rash better but with discontinuation the rash tends to recur and worsen, so they should be avoided. Topical antibiotics, elidel cream, protopic ointment, and oral antibiotics may be prescribed to treat this condition.  Although perioral dermatitis is not an infection, some antibiotics have anti-inflammatory properties that help it greatly.  ? ?Prn flares decrease to Doxycycline '20mg'$  1 po qd #30, 3rf ? ?Doxycycline should be taken with food to prevent nausea. Do not lay down for 30  minutes after taking. Be cautious with sun exposure and use good sun protection while on this medication. Pregnant women should not take this medication.   ? ? ?doxycycline (PERIOSTAT) 20 MG tablet - perinasal, perioral ?Take 1 tablet (20 mg total) by mouth daily. 1 po qd prn flares of perioral dermatitis ? ?Related Medications ?Dapsone (ACZONE) 7.5 % GEL ?Apply to face twice a day ? ?Inflamed seborrheic keratosis ?L nose x 1 ? ?With Sebaceous Hyperplasia ? ?Destruction of lesion - L nose x 1 ?Complexity: simple   ?Destruction method: cryotherapy   ?Informed consent: discussed and consent obtained   ?Timeout:  patient name, date of birth, surgical site, and procedure verified ?Lesion destroyed using liquid nitrogen: Yes   ?Region frozen until ice ball extended beyond lesion: Yes   ?Outcome: patient tolerated procedure well with no complications   ?Post-procedure details: wound care instructions given   ? ? ?Return for as scheduled for TBSE. ? ?I, SOthelia Pulling RMA, am acting as scribe for DSarina Ser MD . ?Documentation: I have reviewed the above documentation for accuracy and completeness, and I agree with the above. ? ?DSarina Ser MD ? ?

## 2021-12-22 NOTE — Patient Instructions (Addendum)

## 2021-12-23 DIAGNOSIS — J301 Allergic rhinitis due to pollen: Secondary | ICD-10-CM | POA: Diagnosis not present

## 2021-12-27 DIAGNOSIS — J301 Allergic rhinitis due to pollen: Secondary | ICD-10-CM | POA: Diagnosis not present

## 2021-12-30 ENCOUNTER — Encounter: Payer: MEDICARE | Attending: Urology | Primary: Internal Medicine

## 2021-12-30 ENCOUNTER — Encounter: Payer: Self-pay | Admitting: Dermatology

## 2021-12-30 DIAGNOSIS — J301 Allergic rhinitis due to pollen: Secondary | ICD-10-CM | POA: Diagnosis not present

## 2022-01-05 DIAGNOSIS — G4710 Hypersomnia, unspecified: Secondary | ICD-10-CM | POA: Diagnosis not present

## 2022-01-05 NOTE — Telephone Encounter (Signed)
Pt called to confirm appt

## 2022-01-06 DIAGNOSIS — J301 Allergic rhinitis due to pollen: Secondary | ICD-10-CM | POA: Diagnosis not present

## 2022-01-09 ENCOUNTER — Other Ambulatory Visit: Payer: Self-pay | Admitting: Dermatology

## 2022-01-09 DIAGNOSIS — L719 Rosacea, unspecified: Secondary | ICD-10-CM

## 2022-01-10 DIAGNOSIS — M542 Cervicalgia: Secondary | ICD-10-CM | POA: Diagnosis not present

## 2022-01-10 DIAGNOSIS — R519 Headache, unspecified: Secondary | ICD-10-CM | POA: Diagnosis not present

## 2022-01-10 DIAGNOSIS — M9901 Segmental and somatic dysfunction of cervical region: Secondary | ICD-10-CM | POA: Diagnosis not present

## 2022-01-10 DIAGNOSIS — M9903 Segmental and somatic dysfunction of lumbar region: Secondary | ICD-10-CM | POA: Diagnosis not present

## 2022-01-11 DIAGNOSIS — B029 Zoster without complications: Secondary | ICD-10-CM | POA: Diagnosis not present

## 2022-01-11 DIAGNOSIS — R1030 Lower abdominal pain, unspecified: Secondary | ICD-10-CM | POA: Diagnosis not present

## 2022-01-11 DIAGNOSIS — R110 Nausea: Secondary | ICD-10-CM | POA: Diagnosis not present

## 2022-01-11 DIAGNOSIS — R140 Abdominal distension (gaseous): Secondary | ICD-10-CM | POA: Diagnosis not present

## 2022-01-13 DIAGNOSIS — J301 Allergic rhinitis due to pollen: Secondary | ICD-10-CM | POA: Diagnosis not present

## 2022-01-27 DIAGNOSIS — J301 Allergic rhinitis due to pollen: Secondary | ICD-10-CM | POA: Diagnosis not present

## 2022-02-02 DIAGNOSIS — Z03818 Encounter for observation for suspected exposure to other biological agents ruled out: Secondary | ICD-10-CM | POA: Diagnosis not present

## 2022-02-02 DIAGNOSIS — J029 Acute pharyngitis, unspecified: Secondary | ICD-10-CM | POA: Diagnosis not present

## 2022-02-02 DIAGNOSIS — J019 Acute sinusitis, unspecified: Secondary | ICD-10-CM | POA: Diagnosis not present

## 2022-02-03 ENCOUNTER — Encounter: Payer: MEDICARE | Attending: Urology | Primary: Internal Medicine

## 2022-02-03 DIAGNOSIS — J301 Allergic rhinitis due to pollen: Secondary | ICD-10-CM | POA: Diagnosis not present

## 2022-02-07 DIAGNOSIS — M9901 Segmental and somatic dysfunction of cervical region: Secondary | ICD-10-CM | POA: Diagnosis not present

## 2022-02-07 DIAGNOSIS — M542 Cervicalgia: Secondary | ICD-10-CM | POA: Diagnosis not present

## 2022-02-07 DIAGNOSIS — M9903 Segmental and somatic dysfunction of lumbar region: Secondary | ICD-10-CM | POA: Diagnosis not present

## 2022-02-07 DIAGNOSIS — R519 Headache, unspecified: Secondary | ICD-10-CM | POA: Diagnosis not present

## 2022-02-09 DIAGNOSIS — Z125 Encounter for screening for malignant neoplasm of prostate: Secondary | ICD-10-CM | POA: Diagnosis not present

## 2022-02-09 DIAGNOSIS — R7303 Prediabetes: Secondary | ICD-10-CM | POA: Diagnosis not present

## 2022-02-10 DIAGNOSIS — J301 Allergic rhinitis due to pollen: Secondary | ICD-10-CM | POA: Diagnosis not present

## 2022-02-16 DIAGNOSIS — J4530 Mild persistent asthma, uncomplicated: Secondary | ICD-10-CM | POA: Diagnosis not present

## 2022-02-16 DIAGNOSIS — D696 Thrombocytopenia, unspecified: Secondary | ICD-10-CM | POA: Diagnosis not present

## 2022-02-16 DIAGNOSIS — R7303 Prediabetes: Secondary | ICD-10-CM | POA: Diagnosis not present

## 2022-02-16 DIAGNOSIS — E559 Vitamin D deficiency, unspecified: Secondary | ICD-10-CM | POA: Diagnosis not present

## 2022-02-16 DIAGNOSIS — G4733 Obstructive sleep apnea (adult) (pediatric): Secondary | ICD-10-CM | POA: Diagnosis not present

## 2022-02-16 DIAGNOSIS — Z0000 Encounter for general adult medical examination without abnormal findings: Secondary | ICD-10-CM | POA: Diagnosis not present

## 2022-02-16 DIAGNOSIS — I480 Paroxysmal atrial fibrillation: Secondary | ICD-10-CM | POA: Diagnosis not present

## 2022-02-16 DIAGNOSIS — I10 Essential (primary) hypertension: Secondary | ICD-10-CM | POA: Diagnosis not present

## 2022-02-17 DIAGNOSIS — J301 Allergic rhinitis due to pollen: Secondary | ICD-10-CM | POA: Diagnosis not present

## 2022-02-24 DIAGNOSIS — J301 Allergic rhinitis due to pollen: Secondary | ICD-10-CM | POA: Diagnosis not present

## 2022-03-01 LAB — HEMOGLOBIN A1C: Hemoglobin A1C, External: 7.2 % — ABNORMAL HIGH (ref 3.2–7.0)

## 2022-03-07 DIAGNOSIS — R519 Headache, unspecified: Secondary | ICD-10-CM | POA: Diagnosis not present

## 2022-03-07 DIAGNOSIS — M542 Cervicalgia: Secondary | ICD-10-CM | POA: Diagnosis not present

## 2022-03-07 DIAGNOSIS — M9901 Segmental and somatic dysfunction of cervical region: Secondary | ICD-10-CM | POA: Diagnosis not present

## 2022-03-07 DIAGNOSIS — M9903 Segmental and somatic dysfunction of lumbar region: Secondary | ICD-10-CM | POA: Diagnosis not present

## 2022-03-09 DIAGNOSIS — Z961 Presence of intraocular lens: Secondary | ICD-10-CM | POA: Diagnosis not present

## 2022-03-17 DIAGNOSIS — J301 Allergic rhinitis due to pollen: Secondary | ICD-10-CM | POA: Diagnosis not present

## 2022-03-20 DIAGNOSIS — J301 Allergic rhinitis due to pollen: Secondary | ICD-10-CM | POA: Diagnosis not present

## 2022-03-24 DIAGNOSIS — J301 Allergic rhinitis due to pollen: Secondary | ICD-10-CM | POA: Diagnosis not present

## 2022-03-28 DIAGNOSIS — R519 Headache, unspecified: Secondary | ICD-10-CM | POA: Diagnosis not present

## 2022-03-28 DIAGNOSIS — M9903 Segmental and somatic dysfunction of lumbar region: Secondary | ICD-10-CM | POA: Diagnosis not present

## 2022-03-28 DIAGNOSIS — M9901 Segmental and somatic dysfunction of cervical region: Secondary | ICD-10-CM | POA: Diagnosis not present

## 2022-03-28 DIAGNOSIS — M542 Cervicalgia: Secondary | ICD-10-CM | POA: Diagnosis not present

## 2022-03-31 DIAGNOSIS — J301 Allergic rhinitis due to pollen: Secondary | ICD-10-CM | POA: Diagnosis not present

## 2022-04-04 DIAGNOSIS — R519 Headache, unspecified: Secondary | ICD-10-CM | POA: Diagnosis not present

## 2022-04-04 DIAGNOSIS — M542 Cervicalgia: Secondary | ICD-10-CM | POA: Diagnosis not present

## 2022-04-04 DIAGNOSIS — M9901 Segmental and somatic dysfunction of cervical region: Secondary | ICD-10-CM | POA: Diagnosis not present

## 2022-04-04 DIAGNOSIS — M9903 Segmental and somatic dysfunction of lumbar region: Secondary | ICD-10-CM | POA: Diagnosis not present

## 2022-04-07 DIAGNOSIS — J301 Allergic rhinitis due to pollen: Secondary | ICD-10-CM | POA: Diagnosis not present

## 2022-04-12 ENCOUNTER — Encounter: Payer: PPO | Admitting: Dermatology

## 2022-04-14 DIAGNOSIS — Z79899 Other long term (current) drug therapy: Secondary | ICD-10-CM | POA: Diagnosis not present

## 2022-04-14 DIAGNOSIS — K4090 Unilateral inguinal hernia, without obstruction or gangrene, not specified as recurrent: Secondary | ICD-10-CM | POA: Diagnosis not present

## 2022-04-14 DIAGNOSIS — N5201 Erectile dysfunction due to arterial insufficiency: Secondary | ICD-10-CM | POA: Diagnosis not present

## 2022-04-14 DIAGNOSIS — N401 Enlarged prostate with lower urinary tract symptoms: Secondary | ICD-10-CM | POA: Diagnosis not present

## 2022-04-14 DIAGNOSIS — J301 Allergic rhinitis due to pollen: Secondary | ICD-10-CM | POA: Diagnosis not present

## 2022-04-20 DIAGNOSIS — J3089 Other allergic rhinitis: Secondary | ICD-10-CM | POA: Diagnosis not present

## 2022-04-20 DIAGNOSIS — J4530 Mild persistent asthma, uncomplicated: Secondary | ICD-10-CM | POA: Diagnosis not present

## 2022-04-21 DIAGNOSIS — J301 Allergic rhinitis due to pollen: Secondary | ICD-10-CM | POA: Diagnosis not present

## 2022-04-25 DIAGNOSIS — M542 Cervicalgia: Secondary | ICD-10-CM | POA: Diagnosis not present

## 2022-04-25 DIAGNOSIS — M9901 Segmental and somatic dysfunction of cervical region: Secondary | ICD-10-CM | POA: Diagnosis not present

## 2022-04-25 DIAGNOSIS — M9903 Segmental and somatic dysfunction of lumbar region: Secondary | ICD-10-CM | POA: Diagnosis not present

## 2022-04-25 DIAGNOSIS — R519 Headache, unspecified: Secondary | ICD-10-CM | POA: Diagnosis not present

## 2022-05-04 DIAGNOSIS — G4733 Obstructive sleep apnea (adult) (pediatric): Secondary | ICD-10-CM | POA: Diagnosis not present

## 2022-05-04 DIAGNOSIS — G4710 Hypersomnia, unspecified: Secondary | ICD-10-CM | POA: Diagnosis not present

## 2022-05-05 DIAGNOSIS — J301 Allergic rhinitis due to pollen: Secondary | ICD-10-CM | POA: Diagnosis not present

## 2022-05-12 DIAGNOSIS — J301 Allergic rhinitis due to pollen: Secondary | ICD-10-CM | POA: Diagnosis not present

## 2022-05-18 ENCOUNTER — Encounter: Payer: PPO | Admitting: Dermatology

## 2022-05-19 DIAGNOSIS — J301 Allergic rhinitis due to pollen: Secondary | ICD-10-CM | POA: Diagnosis not present

## 2022-05-22 ENCOUNTER — Ambulatory Visit: Payer: PPO | Admitting: Dermatology

## 2022-05-22 DIAGNOSIS — Z79899 Other long term (current) drug therapy: Secondary | ICD-10-CM | POA: Diagnosis not present

## 2022-05-22 DIAGNOSIS — L578 Other skin changes due to chronic exposure to nonionizing radiation: Secondary | ICD-10-CM

## 2022-05-22 DIAGNOSIS — L409 Psoriasis, unspecified: Secondary | ICD-10-CM | POA: Diagnosis not present

## 2022-05-22 DIAGNOSIS — D229 Melanocytic nevi, unspecified: Secondary | ICD-10-CM | POA: Diagnosis not present

## 2022-05-22 DIAGNOSIS — L719 Rosacea, unspecified: Secondary | ICD-10-CM

## 2022-05-22 DIAGNOSIS — L821 Other seborrheic keratosis: Secondary | ICD-10-CM | POA: Diagnosis not present

## 2022-05-22 DIAGNOSIS — L710 Perioral dermatitis: Secondary | ICD-10-CM

## 2022-05-22 DIAGNOSIS — D1800 Hemangioma unspecified site: Secondary | ICD-10-CM

## 2022-05-22 DIAGNOSIS — L814 Other melanin hyperpigmentation: Secondary | ICD-10-CM | POA: Diagnosis not present

## 2022-05-22 DIAGNOSIS — D692 Other nonthrombocytopenic purpura: Secondary | ICD-10-CM | POA: Diagnosis not present

## 2022-05-22 DIAGNOSIS — L720 Epidermal cyst: Secondary | ICD-10-CM | POA: Diagnosis not present

## 2022-05-22 DIAGNOSIS — I781 Nevus, non-neoplastic: Secondary | ICD-10-CM

## 2022-05-22 DIAGNOSIS — Z1283 Encounter for screening for malignant neoplasm of skin: Secondary | ICD-10-CM | POA: Diagnosis not present

## 2022-05-22 MED ORDER — METRONIDAZOLE 0.75 % EX CREA: TOPICAL_CREAM | CUTANEOUS | 11 refills | Status: DC

## 2022-05-22 MED ORDER — DOXYCYCLINE HYCLATE 20 MG PO TABS: 20.0000 mg | ORAL_TABLET | ORAL | 3 refills | Status: AC

## 2022-05-22 NOTE — Progress Notes (Unsigned)
Follow-Up Visit   Subjective  Jimmy Obrien is a 76 y.o. male who presents for the following: Annual Exam (1 year tbse. Hx of rosacea , spot at left side of nose , cyst next to left eye.). The patient presents for Total-Body Skin Exam (TBSE) for skin cancer screening and mole check.  The patient has spots, moles and lesions to be evaluated, some may be new or changing and the patient has concerns that these could be cancer.  The following portions of the chart were reviewed this encounter and updated as appropriate:  Tobacco  Allergies  Meds  Problems  Med Hx  Surg Hx  Fam Hx     Review of Systems: No other skin or systemic complaints except as noted in HPI or Assessment and Plan.  Objective  Well appearing patient in no apparent distress; mood and affect are within normal limits.  A full examination was performed including scalp, head, eyes, ears, nose, lips, neck, chest, axillae, abdomen, back, buttocks, bilateral upper extremities, bilateral lower extremities, hands, feet, fingers, toes, fingernails, and toenails. All findings within normal limits unless otherwise noted below.  forehead Smooth white papule(s).    Assessment & Plan  Rosacea  Chronic and persistent condition with duration or expected duration over one year. Condition is symptomatic / bothersome to patient. Not to goal. Improved Head - Anterior (Face) Rosacea is a chronic progressive skin condition usually affecting the face of adults, causing redness and/or acne bumps. It is treatable but not curable. It sometimes affects the eyes (ocular rosacea) as well. It may respond to topical and/or systemic medication and can flare with stress, sun exposure, alcohol, exercise and some foods.  Daily application of broad spectrum spf 30+ sunscreen to face is recommended to reduce flares. Continue  Metrocream qhs Continue Doxycycline '20mg'$  1 po qd with food and drink for 1 month  (aware of pts tetracycline allergy, reaction  was inflammation in groin, may be r/t old tetracycline, advised pt he may d/c doxycycline if he notices same rxn).  metroNIDAZOLE (METROCREAM) 0.75 % cream - Head - Anterior (Face) Apply topically at bedtime. TO FACE FOR ROSACEA  doxycycline (PERIOSTAT) 20 MG tablet - Head - Anterior (Face) Take 1 tablet (20 mg total) by mouth See admin instructions. Take bid when flared when no longer flared can take once daily. Take with food and drink for rosacea  Perioral dermatitis Head - Anterior (Face) Perioral dermatitis is an eruption which is usually located around the mouth and nose.  It can be a rash and/or red bumps.  It occasionally occurs around the eyes.  It may be itchy and may burn.  The exact cause is unknown.  Some types of makeup, moisturizers, dental products, and prescription creams may be partially responsible for the eruption.  Topical steroids such as cortisone creams can temporarily make the rash better but with discontinuation the rash tends to recur and worsen, so they should be avoided. Topical antibiotics, elidel cream, protopic ointment, and oral antibiotics may be prescribed to treat this condition.  Although perioral dermatitis is not an infection, some antibiotics have anti-inflammatory properties that help it greatly.    Prn flares decrease to Doxycycline '20mg'$  1 po qd #30, 3rf Can use twice daily if flared   Dapsone 7.5 % gel bid    Doxycycline should be taken with food to prevent nausea. Do not lay down for 30 minutes after taking. Be cautious with sun exposure and use good sun protection while on  this medication. Pregnant women should not take this medication.    doxycycline (PERIOSTAT) 20 MG tablet - Head - Anterior (Face) Take 1 tablet (20 mg total) by mouth See admin instructions. Take bid when flared when no longer flared can take once daily. Take with food and drink for rosacea  Related Medications Dapsone (ACZONE) 7.5 % GEL Apply to face twice a  day  Milia forehead Benign, observe.   Psoriasis Right Forearm - Anterior Psoriasis is a chronic non-curable, but treatable genetic/hereditary disease that may have other systemic features affecting other organ systems such as joints (Psoriatic Arthritis). It is associated with an increased risk of inflammatory bowel disease, heart disease, non-alcoholic fatty liver disease, and depression.   Cont  only when flared Ultravate cream qd up to 5d/wk aa hands until clear, avoid f/g/a   Related Medications halobetasol (ULTRAVATE) 0.05 % cream Apply topically as directed for 14 days, ONCE A DAY up to 5 days a week to AFFECTED AREAS OF psoriasis on hands AS NEEDED FOR flares. Avoid face, groin, axilla  Lentigines - Scattered tan macules - Due to sun exposure - Benign-appearing, observe - Recommend daily broad spectrum sunscreen SPF 30+ to sun-exposed areas, reapply every 2 hours as needed. - Call for any changes  Telangiectasia At left side of nose - Dilated blood vessel - Benign appearing on exam - Call for changes  Purpura - Chronic; persistent and recurrent.  Treatable, but not curable. - Violaceous macules and patches - Benign - Related to trauma, age, sun damage and/or use of blood thinners, chronic use of topical and/or oral steroids - Observe - Can use OTC arnica containing moisturizer such as Dermend Bruise Formula if desired - Call for worsening or other concerns  Seborrheic Keratoses - Stuck-on, waxy, tan-brown papules and/or plaques  - Benign-appearing - Discussed benign etiology and prognosis. - Observe - Call for any changes  Melanocytic Nevi - Tan-brown and/or pink-flesh-colored symmetric macules and papules - Benign appearing on exam today - Observation - Call clinic for new or changing moles - Recommend daily use of broad spectrum spf 30+ sunscreen to sun-exposed areas.   Hemangiomas - Red papules - Discussed benign nature - Observe - Call for any  changes  Actinic Damage - Chronic condition, secondary to cumulative UV/sun exposure - diffuse scaly erythematous macules with underlying dyspigmentation - Recommend daily broad spectrum sunscreen SPF 30+ to sun-exposed areas, reapply every 2 hours as needed.  - Staying in the shade or wearing long sleeves, sun glasses (UVA+UVB protection) and wide brim hats (4-inch brim around the entire circumference of the hat) are also recommended for sun protection.  - Call for new or changing lesions.  Skin cancer screening performed today. Return in about 1 year (around 05/23/2023) for TBSE. IRuthell Rummage, CMA, am acting as scribe for Sarina Ser, MD. Documentation: I have reviewed the above documentation for accuracy and completeness, and I agree with the above.  Sarina Ser, MD

## 2022-05-22 NOTE — Patient Instructions (Addendum)
For Rosacea   Continue metro cream daily Continue doxycycline 20 mg twice daily if flared when no longer flared can continue taking once daily   Doxycycline should be taken with food to prevent nausea. Do not lay down for 30 minutes after taking. Be cautious with sun exposure and use good sun protection while on this medication. Pregnant women should not take this medication.    For rough bumpy areas at arms Recommend starting moisturizer with exfoliant (Urea, Salicylic acid, or Lactic acid) one to two times daily to help smooth rough and bumpy skin.  OTC options include Cetaphil Rough and Bumpy lotion (Urea), Eucerin Roughness Relief lotion or spot treatment cream (Urea), CeraVe SA lotion/cream for Rough and Bumpy skin (Sal Acid), Gold Bond Rough and Bumpy cream (Sal Acid), and AmLactin 12% lotion/cream (Lactic Acid).  If applying in morning, also apply sunscreen to sun-exposed areas, since these exfoliating moisturizers can increase sensitivity to sun.     Melanoma ABCDEs  Melanoma is the most dangerous type of skin cancer, and is the leading cause of death from skin disease.  You are more likely to develop melanoma if you: Have light-colored skin, light-colored eyes, or red or blond hair Spend a lot of time in the sun Tan regularly, either outdoors or in a tanning bed Have had blistering sunburns, especially during childhood Have a close family member who has had a melanoma Have atypical moles or large birthmarks  Early detection of melanoma is key since treatment is typically straightforward and cure rates are extremely high if we catch it early.   The first sign of melanoma is often a change in a mole or a new dark spot.  The ABCDE system is a way of remembering the signs of melanoma.  A for asymmetry:  The two halves do not match. B for border:  The edges of the growth are irregular. C for color:  A mixture of colors are present instead of an even brown color. D for diameter:   Melanomas are usually (but not always) greater than 54m - the size of a pencil eraser. E for evolution:  The spot keeps changing in size, shape, and color.  Please check your skin once per month between visits. You can use a small mirror in front and a large mirror behind you to keep an eye on the back side or your body.   If you see any new or changing lesions before your next follow-up, please call to schedule a visit.  Please continue daily skin protection including broad spectrum sunscreen SPF 30+ to sun-exposed areas, reapplying every 2 hours as needed when you're outdoors.   Staying in the shade or wearing long sleeves, sun glasses (UVA+UVB protection) and wide brim hats (4-inch brim around the entire circumference of the hat) are also recommended for sun protection.    Due to recent changes in healthcare laws, you may see results of your pathology and/or laboratory studies on MyChart before the doctors have had a chance to review them. We understand that in some cases there may be results that are confusing or concerning to you. Please understand that not all results are received at the same time and often the doctors may need to interpret multiple results in order to provide you with the best plan of care or course of treatment. Therefore, we ask that you please give u[place]2 business days to thoroughly review all your results before contacting the office for clarification. Should we see a critical  lab result, you will be contacted sooner.   If You Need Anything After Your Visit  If you have any questions or concerns for your doctor, please call our main line at [phone] and press option 4 to reach your doctor's medical assistant. If no one answers, please leave a voicemail as directed and we will return your call as soon as possible. Messages left after 4 pm will be answered the following business day.   You may also send [place] a message via [place]. We typically respond to MyChart messages  within 1-2 business days.  For prescription refills, please ask your pharmacy to contact our office. Our fax number is [phone].  If you have an urgent issue when the clinic is closed that cannot wait until the next business day, you can page your doctor at the number below.    Please note that while we do our best to be available for urgent issues outside of office hours, we are not available 24/7.   If you have an urgent issue and are unable to reach [place], you may choose to seek medical care at your doctor's office, retail clinic, urgent care center, or emergency room.  If you have a medical emergency, please immediately call 911 or go to the emergency department.  Pager Numbers  - Dr. Nehemiah Massed: [phone]  - Dr. Laurence Ferrari: [phone]  - Dr. Nicole Kindred: [phone]  In the event of inclement weather, please call our main line at [phone] for an update on the status of any delays or closures.  Dermatology Medication Tips: Please keep the boxes that topical medications come in in order to help keep track of the instructions about where and how to use these. Pharmacies typically print the medication instructions only on the boxes and not directly on the medication tubes.   If your medication is too expensive, please contact our office at [phone] option 4 or send [place] a message through [place].   We are unable to tell what your co-pay for medications will be in advance as this is different depending on your insurance coverage. However, we may be able to find a substitute medication at lower cost or fill out paperwork to get insurance to cover a needed medication.   If a prior authorization is required to get your medication covered by your insurance company, please allow [place] 1-2 business days to complete this process.  Drug prices often vary depending on where the prescription is filled and some pharmacies may offer cheaper prices.  The website www.goodrx.com contains coupons for  medications through different pharmacies. The prices here do not account for what the cost may be with help from insurance (it may be cheaper with your insurance), but the website can give you the price if you did not use any insurance.  - You can print the associated coupon and take it with your prescription to the pharmacy.  - You may also stop by our office during regular business hours and pick up a GoodRx coupon card.  - If you need your prescription sent electronically to a different pharmacy, notify our office through [facility] or by phone at [phone] option 4.     Si Usted Necesita Algo Despus de Su Visita  Tambin puede enviarnos un mensaje a travs de Pharmacist, community. Por lo general respondemos a los mensajes de MyChart en el transcurso de 1 a 2 das hbiles.  Para renovar recetas, por favor pida a su farmacia que se ponga en contacto con nuestra oficina.  Harland Dingwall de fax es [place] [phone].  Si tiene un asunto urgente cuando la clnica est cerrada y que no puede esperar hasta el siguiente da hbil, puede llamar/localizar a su doctor(a) al nmero que aparece a continuacin.   Por favor, tenga en cuenta que aunque hacemos todo lo posible para estar disponibles para asuntos urgentes fuera del horario de [place], no estamos disponibles las 24 horas del da, los 7 das de la [place].   Si tiene un problema urgente y no puede comunicarse con nosotros, puede optar por buscar atencin mdica  en el consultorio de su doctor(a), en una clnica privada, en un centro de atencin urgente o en una sala de emergencias.  Si tiene Engineering geologist, por favor llame inmediatamente al 911 o vaya a la sala de emergencias.  Nmeros de bper  - Dr. Nehemiah Massed: [phone]  - Dra. Moye: [phone]  - Dra. Nicole Kindred: [phone]  En caso de inclemencias del [place], por favor llame a Johnsie Kindred principal al [phone] para una actualizacin sobre el [place] de cualquier retraso o  cierre.  Consejos para la medicacin en dermatologa: Por favor, guarde las cajas en las que vienen los medicamentos de uso tpico para ayudarle a seguir las instrucciones sobre dnde y cmo usarlos. Las farmacias generalmente imprimen las instrucciones del medicamento slo en las cajas y no directamente en los tubos del [place].   Si su medicamento es muy caro, por favor, pngase en contacto con Zigmund Daniel llamando al [phone] y presione la opcin 4 o envenos un mensaje a travs de Pharmacist, community.   No podemos decirle cul ser su copago por los medicamentos por adelantado ya que esto es diferente dependiendo de la cobertura de su seguro. Sin embargo, es posible que podamos encontrar un medicamento sustituto a Electrical engineer un formulario para que el seguro cubra el medicamento que se considera necesario.   Si se requiere una autorizacin previa para que su compaa de seguros [place] su medicamento, por favor permtanos de 1 a 2 das hbiles para completar este proceso.  Los precios de los medicamentos varan con frecuencia dependiendo del Environmental consultant de dnde se surte la receta y alguna farmacias pueden ofrecer precios ms baratos.  El sitio web www.goodrx.com tiene cupones para medicamentos de Airline pilot. Los precios aqu no tienen en cuenta lo que podra costar con la ayuda del seguro (puede ser ms barato con su seguro), pero el sitio web puede darle el precio si no utiliz Research scientist (physical sciences).  - Puede imprimir el cupn correspondiente y llevarlo con su receta a la farmacia.  - Tambin puede pasar por nuestra oficina durante el horario de atencin regular y Charity fundraiser una tarjeta de cupones de GoodRx.  - Si necesita que su receta se enve electrnicamente a una farmacia diferente, informe a nuestra oficina a travs de MyChart de  o por telfono llamando al [phone] y presione la opcin 4.

## 2022-05-23 DIAGNOSIS — M542 Cervicalgia: Secondary | ICD-10-CM | POA: Diagnosis not present

## 2022-05-23 DIAGNOSIS — M9903 Segmental and somatic dysfunction of lumbar region: Secondary | ICD-10-CM | POA: Diagnosis not present

## 2022-05-23 DIAGNOSIS — M9901 Segmental and somatic dysfunction of cervical region: Secondary | ICD-10-CM | POA: Diagnosis not present

## 2022-05-23 DIAGNOSIS — R519 Headache, unspecified: Secondary | ICD-10-CM | POA: Diagnosis not present

## 2022-05-24 ENCOUNTER — Encounter: Payer: Self-pay | Admitting: Dermatology

## 2022-05-26 DIAGNOSIS — J301 Allergic rhinitis due to pollen: Secondary | ICD-10-CM | POA: Diagnosis not present

## 2022-06-01 DIAGNOSIS — B9689 Other specified bacterial agents as the cause of diseases classified elsewhere: Secondary | ICD-10-CM | POA: Diagnosis not present

## 2022-06-01 DIAGNOSIS — Z03818 Encounter for observation for suspected exposure to other biological agents ruled out: Secondary | ICD-10-CM | POA: Diagnosis not present

## 2022-06-01 DIAGNOSIS — J329 Chronic sinusitis, unspecified: Secondary | ICD-10-CM | POA: Diagnosis not present

## 2022-06-08 LAB — HEMOGLOBIN A1C: Hemoglobin A1C, External: 7.3 % — ABNORMAL HIGH (ref 3.2–7.0)

## 2022-06-09 DIAGNOSIS — J301 Allergic rhinitis due to pollen: Secondary | ICD-10-CM | POA: Diagnosis not present

## 2022-06-16 DIAGNOSIS — J301 Allergic rhinitis due to pollen: Secondary | ICD-10-CM | POA: Diagnosis not present

## 2022-06-19 DIAGNOSIS — J301 Allergic rhinitis due to pollen: Secondary | ICD-10-CM | POA: Diagnosis not present

## 2022-06-20 DIAGNOSIS — M542 Cervicalgia: Secondary | ICD-10-CM | POA: Diagnosis not present

## 2022-06-20 DIAGNOSIS — M9901 Segmental and somatic dysfunction of cervical region: Secondary | ICD-10-CM | POA: Diagnosis not present

## 2022-06-20 DIAGNOSIS — R519 Headache, unspecified: Secondary | ICD-10-CM | POA: Diagnosis not present

## 2022-06-20 DIAGNOSIS — M9903 Segmental and somatic dysfunction of lumbar region: Secondary | ICD-10-CM | POA: Diagnosis not present

## 2022-06-23 DIAGNOSIS — J301 Allergic rhinitis due to pollen: Secondary | ICD-10-CM | POA: Diagnosis not present

## 2022-06-30 DIAGNOSIS — J301 Allergic rhinitis due to pollen: Secondary | ICD-10-CM | POA: Diagnosis not present

## 2022-07-11 DIAGNOSIS — K219 Gastro-esophageal reflux disease without esophagitis: Secondary | ICD-10-CM | POA: Diagnosis not present

## 2022-07-11 DIAGNOSIS — J301 Allergic rhinitis due to pollen: Secondary | ICD-10-CM | POA: Diagnosis not present

## 2022-07-14 DIAGNOSIS — J301 Allergic rhinitis due to pollen: Secondary | ICD-10-CM | POA: Diagnosis not present

## 2022-07-18 DIAGNOSIS — M9903 Segmental and somatic dysfunction of lumbar region: Secondary | ICD-10-CM | POA: Diagnosis not present

## 2022-07-18 DIAGNOSIS — R519 Headache, unspecified: Secondary | ICD-10-CM | POA: Diagnosis not present

## 2022-07-18 DIAGNOSIS — M9901 Segmental and somatic dysfunction of cervical region: Secondary | ICD-10-CM | POA: Diagnosis not present

## 2022-07-18 DIAGNOSIS — M542 Cervicalgia: Secondary | ICD-10-CM | POA: Diagnosis not present

## 2022-07-19 DIAGNOSIS — J301 Allergic rhinitis due to pollen: Secondary | ICD-10-CM | POA: Diagnosis not present

## 2022-07-19 DIAGNOSIS — K219 Gastro-esophageal reflux disease without esophagitis: Secondary | ICD-10-CM | POA: Diagnosis not present

## 2022-07-19 DIAGNOSIS — G4733 Obstructive sleep apnea (adult) (pediatric): Secondary | ICD-10-CM | POA: Diagnosis not present

## 2022-07-19 DIAGNOSIS — Z23 Encounter for immunization: Secondary | ICD-10-CM | POA: Diagnosis not present

## 2022-07-21 DIAGNOSIS — J301 Allergic rhinitis due to pollen: Secondary | ICD-10-CM | POA: Diagnosis not present

## 2022-07-28 DIAGNOSIS — J301 Allergic rhinitis due to pollen: Secondary | ICD-10-CM | POA: Diagnosis not present

## 2022-08-08 LAB — HEMOGLOBIN A1C: Hemoglobin A1C, External: 7.5 % — ABNORMAL HIGH (ref 3.2–7.0)

## 2022-08-11 DIAGNOSIS — J301 Allergic rhinitis due to pollen: Secondary | ICD-10-CM | POA: Diagnosis not present

## 2022-08-15 DIAGNOSIS — M542 Cervicalgia: Secondary | ICD-10-CM | POA: Diagnosis not present

## 2022-08-15 DIAGNOSIS — M9903 Segmental and somatic dysfunction of lumbar region: Secondary | ICD-10-CM | POA: Diagnosis not present

## 2022-08-15 DIAGNOSIS — R519 Headache, unspecified: Secondary | ICD-10-CM | POA: Diagnosis not present

## 2022-08-15 DIAGNOSIS — M9901 Segmental and somatic dysfunction of cervical region: Secondary | ICD-10-CM | POA: Diagnosis not present

## 2022-08-17 DIAGNOSIS — R7303 Prediabetes: Secondary | ICD-10-CM | POA: Diagnosis not present

## 2022-08-17 DIAGNOSIS — I10 Essential (primary) hypertension: Secondary | ICD-10-CM | POA: Diagnosis not present

## 2022-08-18 DIAGNOSIS — J301 Allergic rhinitis due to pollen: Secondary | ICD-10-CM | POA: Diagnosis not present

## 2022-08-24 DIAGNOSIS — G4733 Obstructive sleep apnea (adult) (pediatric): Secondary | ICD-10-CM | POA: Diagnosis not present

## 2022-08-24 DIAGNOSIS — J4530 Mild persistent asthma, uncomplicated: Secondary | ICD-10-CM | POA: Diagnosis not present

## 2022-08-24 DIAGNOSIS — Z0001 Encounter for general adult medical examination with abnormal findings: Secondary | ICD-10-CM | POA: Diagnosis not present

## 2022-08-24 DIAGNOSIS — I480 Paroxysmal atrial fibrillation: Secondary | ICD-10-CM | POA: Diagnosis not present

## 2022-08-24 DIAGNOSIS — K219 Gastro-esophageal reflux disease without esophagitis: Secondary | ICD-10-CM | POA: Diagnosis not present

## 2022-08-24 DIAGNOSIS — I10 Essential (primary) hypertension: Secondary | ICD-10-CM | POA: Diagnosis not present

## 2022-08-24 DIAGNOSIS — R7303 Prediabetes: Secondary | ICD-10-CM | POA: Diagnosis not present

## 2022-08-24 DIAGNOSIS — I700 Atherosclerosis of aorta: Secondary | ICD-10-CM | POA: Diagnosis not present

## 2022-08-24 DIAGNOSIS — D696 Thrombocytopenia, unspecified: Secondary | ICD-10-CM | POA: Diagnosis not present

## 2022-08-24 DIAGNOSIS — N400 Enlarged prostate without lower urinary tract symptoms: Secondary | ICD-10-CM | POA: Diagnosis not present

## 2022-08-25 DIAGNOSIS — J301 Allergic rhinitis due to pollen: Secondary | ICD-10-CM | POA: Diagnosis not present

## 2022-09-01 DIAGNOSIS — J301 Allergic rhinitis due to pollen: Secondary | ICD-10-CM | POA: Diagnosis not present

## 2022-09-08 DIAGNOSIS — J301 Allergic rhinitis due to pollen: Secondary | ICD-10-CM | POA: Diagnosis not present

## 2022-09-12 DIAGNOSIS — M9903 Segmental and somatic dysfunction of lumbar region: Secondary | ICD-10-CM | POA: Diagnosis not present

## 2022-09-12 DIAGNOSIS — R519 Headache, unspecified: Secondary | ICD-10-CM | POA: Diagnosis not present

## 2022-09-12 DIAGNOSIS — M542 Cervicalgia: Secondary | ICD-10-CM | POA: Diagnosis not present

## 2022-09-12 DIAGNOSIS — M9901 Segmental and somatic dysfunction of cervical region: Secondary | ICD-10-CM | POA: Diagnosis not present

## 2022-09-15 DIAGNOSIS — J301 Allergic rhinitis due to pollen: Secondary | ICD-10-CM | POA: Diagnosis not present

## 2022-09-22 DIAGNOSIS — J301 Allergic rhinitis due to pollen: Secondary | ICD-10-CM | POA: Diagnosis not present

## 2022-09-29 DIAGNOSIS — J301 Allergic rhinitis due to pollen: Secondary | ICD-10-CM | POA: Diagnosis not present

## 2022-10-06 DIAGNOSIS — J301 Allergic rhinitis due to pollen: Secondary | ICD-10-CM | POA: Diagnosis not present

## 2022-10-10 DIAGNOSIS — R519 Headache, unspecified: Secondary | ICD-10-CM | POA: Diagnosis not present

## 2022-10-10 DIAGNOSIS — M9901 Segmental and somatic dysfunction of cervical region: Secondary | ICD-10-CM | POA: Diagnosis not present

## 2022-10-10 DIAGNOSIS — M542 Cervicalgia: Secondary | ICD-10-CM | POA: Diagnosis not present

## 2022-10-10 DIAGNOSIS — M9903 Segmental and somatic dysfunction of lumbar region: Secondary | ICD-10-CM | POA: Diagnosis not present

## 2022-10-13 DIAGNOSIS — J301 Allergic rhinitis due to pollen: Secondary | ICD-10-CM | POA: Diagnosis not present

## 2022-10-20 DIAGNOSIS — J301 Allergic rhinitis due to pollen: Secondary | ICD-10-CM | POA: Diagnosis not present

## 2022-10-24 ENCOUNTER — Other Ambulatory Visit: Payer: Self-pay

## 2022-10-24 DIAGNOSIS — R1031 Right lower quadrant pain: Secondary | ICD-10-CM | POA: Diagnosis not present

## 2022-10-24 DIAGNOSIS — K5909 Other constipation: Secondary | ICD-10-CM | POA: Diagnosis not present

## 2022-10-24 DIAGNOSIS — R1032 Left lower quadrant pain: Secondary | ICD-10-CM

## 2022-10-24 DIAGNOSIS — K5792 Diverticulitis of intestine, part unspecified, without perforation or abscess without bleeding: Secondary | ICD-10-CM

## 2022-10-26 DIAGNOSIS — E0520 Thyrotoxicosis with toxic multinodular goiter without thyrotoxic crisis or storm: Secondary | ICD-10-CM | POA: Diagnosis not present

## 2022-10-27 DIAGNOSIS — J301 Allergic rhinitis due to pollen: Secondary | ICD-10-CM | POA: Diagnosis not present

## 2022-11-02 DIAGNOSIS — E0520 Thyrotoxicosis with toxic multinodular goiter without thyrotoxic crisis or storm: Secondary | ICD-10-CM | POA: Diagnosis not present

## 2022-11-03 DIAGNOSIS — J301 Allergic rhinitis due to pollen: Secondary | ICD-10-CM | POA: Diagnosis not present

## 2022-11-06 ENCOUNTER — Other Ambulatory Visit: Payer: PPO

## 2022-11-07 ENCOUNTER — Ambulatory Visit
Admission: RE | Admit: 2022-11-07 | Discharge: 2022-11-07 | Disposition: A | Discharge status: Home / Self Care | Payer: PPO | Source: Ambulatory Visit | Attending: Gastroenterology | Admitting: Gastroenterology

## 2022-11-07 DIAGNOSIS — K5909 Other constipation: Secondary | ICD-10-CM | POA: Diagnosis not present

## 2022-11-07 DIAGNOSIS — K5792 Diverticulitis of intestine, part unspecified, without perforation or abscess without bleeding: Secondary | ICD-10-CM | POA: Insufficient documentation

## 2022-11-07 DIAGNOSIS — M9903 Segmental and somatic dysfunction of lumbar region: Secondary | ICD-10-CM | POA: Diagnosis not present

## 2022-11-07 DIAGNOSIS — R1032 Left lower quadrant pain: Secondary | ICD-10-CM

## 2022-11-07 DIAGNOSIS — R1031 Right lower quadrant pain: Secondary | ICD-10-CM | POA: Insufficient documentation

## 2022-11-07 DIAGNOSIS — N1330 Unspecified hydronephrosis: Secondary | ICD-10-CM | POA: Diagnosis not present

## 2022-11-07 DIAGNOSIS — K760 Fatty (change of) liver, not elsewhere classified: Secondary | ICD-10-CM | POA: Diagnosis not present

## 2022-11-07 DIAGNOSIS — M9901 Segmental and somatic dysfunction of cervical region: Secondary | ICD-10-CM | POA: Diagnosis not present

## 2022-11-07 DIAGNOSIS — K5730 Diverticulosis of large intestine without perforation or abscess without bleeding: Secondary | ICD-10-CM | POA: Diagnosis not present

## 2022-11-07 DIAGNOSIS — R519 Headache, unspecified: Secondary | ICD-10-CM | POA: Diagnosis not present

## 2022-11-07 DIAGNOSIS — M542 Cervicalgia: Secondary | ICD-10-CM | POA: Diagnosis not present

## 2022-11-07 MED ORDER — IOHEXOL 300 MG/ML  SOLN
100.0000 mL | Freq: Once | INTRAMUSCULAR | Status: AC | PRN
  Administered 2022-11-07: 100 mL via INTRAVENOUS

## 2022-11-09 LAB — HEMOGLOBIN A1C: Hemoglobin A1C, External: 7.3 % — ABNORMAL HIGH (ref 3.2–7.0)

## 2022-11-10 DIAGNOSIS — J301 Allergic rhinitis due to pollen: Secondary | ICD-10-CM | POA: Diagnosis not present

## 2022-11-16 DIAGNOSIS — J4530 Mild persistent asthma, uncomplicated: Secondary | ICD-10-CM | POA: Diagnosis not present

## 2022-11-16 DIAGNOSIS — G4733 Obstructive sleep apnea (adult) (pediatric): Secondary | ICD-10-CM | POA: Diagnosis not present

## 2022-11-16 DIAGNOSIS — R0602 Shortness of breath: Secondary | ICD-10-CM | POA: Diagnosis not present

## 2022-11-17 DIAGNOSIS — J301 Allergic rhinitis due to pollen: Secondary | ICD-10-CM | POA: Diagnosis not present

## 2022-11-24 DIAGNOSIS — J301 Allergic rhinitis due to pollen: Secondary | ICD-10-CM | POA: Diagnosis not present

## 2022-11-28 DIAGNOSIS — J301 Allergic rhinitis due to pollen: Secondary | ICD-10-CM | POA: Diagnosis not present

## 2022-12-01 DIAGNOSIS — J301 Allergic rhinitis due to pollen: Secondary | ICD-10-CM | POA: Diagnosis not present

## 2022-12-05 DIAGNOSIS — R519 Headache, unspecified: Secondary | ICD-10-CM | POA: Diagnosis not present

## 2022-12-05 DIAGNOSIS — M542 Cervicalgia: Secondary | ICD-10-CM | POA: Diagnosis not present

## 2022-12-05 DIAGNOSIS — M9903 Segmental and somatic dysfunction of lumbar region: Secondary | ICD-10-CM | POA: Diagnosis not present

## 2022-12-05 DIAGNOSIS — M9901 Segmental and somatic dysfunction of cervical region: Secondary | ICD-10-CM | POA: Diagnosis not present

## 2022-12-12 DIAGNOSIS — K219 Gastro-esophageal reflux disease without esophagitis: Secondary | ICD-10-CM | POA: Diagnosis not present

## 2022-12-12 DIAGNOSIS — K5792 Diverticulitis of intestine, part unspecified, without perforation or abscess without bleeding: Secondary | ICD-10-CM | POA: Diagnosis not present

## 2022-12-15 DIAGNOSIS — J301 Allergic rhinitis due to pollen: Secondary | ICD-10-CM | POA: Diagnosis not present

## 2022-12-29 DIAGNOSIS — J301 Allergic rhinitis due to pollen: Secondary | ICD-10-CM | POA: Diagnosis not present

## 2023-01-02 DIAGNOSIS — M9901 Segmental and somatic dysfunction of cervical region: Secondary | ICD-10-CM | POA: Diagnosis not present

## 2023-01-02 DIAGNOSIS — R519 Headache, unspecified: Secondary | ICD-10-CM | POA: Diagnosis not present

## 2023-01-02 DIAGNOSIS — M542 Cervicalgia: Secondary | ICD-10-CM | POA: Diagnosis not present

## 2023-01-02 DIAGNOSIS — M9903 Segmental and somatic dysfunction of lumbar region: Secondary | ICD-10-CM | POA: Diagnosis not present

## 2023-01-05 DIAGNOSIS — J301 Allergic rhinitis due to pollen: Secondary | ICD-10-CM | POA: Diagnosis not present

## 2023-01-12 DIAGNOSIS — J301 Allergic rhinitis due to pollen: Secondary | ICD-10-CM | POA: Diagnosis not present
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
## 2023-01-19 DIAGNOSIS — J301 Allergic rhinitis due to pollen: Secondary | ICD-10-CM | POA: Diagnosis not present

## 2023-01-24 ENCOUNTER — Encounter

## 2023-01-24 ENCOUNTER — Encounter: Admit: 2023-01-24 | Discharge: 2023-01-24 | Payer: MEDICARE | Attending: Adult Health | Primary: Internal Medicine

## 2023-01-24 DIAGNOSIS — C61 Malignant neoplasm of prostate: Secondary | ICD-10-CM

## 2023-01-24 LAB — AMB POC URINALYSIS DIP STICK AUTO W/O MICRO
Bilirubin, Urine, POC: NEGATIVE
Glucose, Urine, POC: NEGATIVE
KETONES, Urine, POC: NEGATIVE
Nitrite, Urine, POC: NEGATIVE
Protein, Urine, POC: 30
Specific Gravity, Urine, POC: 1.02 (ref 1.001–1.035)
Urobilinogen, POC: 0.2
pH, Urine, POC: 5.5 (ref 4.6–8.0)

## 2023-01-24 MED ORDER — FINASTERIDE 5 MG PO TABS
5 | ORAL_TABLET | Freq: Every day | ORAL | 3 refills | Status: AC
Start: 2023-01-24 — End: ?

## 2023-01-24 MED ORDER — DOXYCYCLINE MONOHYDRATE 100 MG PO TABS
100 | ORAL_TABLET | Freq: Two times a day (BID) | ORAL | 0 refills | Status: AC
Start: 2023-01-24 — End: 2023-01-31

## 2023-01-24 NOTE — Progress Notes (Signed)
[facility]  [address]  [place], [place] [address]  [phone]          [name]  DOB: Nov 14, 1945    Chief Complaint   Patient presents with    Follow-up    Urinary Tract Infection          HPI     Jason Fernandez is a 77 y.o. male  Here today with complaints of painful urination. He was seen last year due to episode of gross blood. He was scheduled to have cystoscopy but he cancelled once and no showed for the other 2 appts that had been arranged.  Pt has history of CaP and BPH.  Reports min LUTS and is no longer on Flomax or Proscar.  Stream slow at times with mild daytime freq.  Previous CVA in 2017 without residual effects.  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  PSA remains stable at 0.094 on 09/16/20.  Denies any hematuria.  PVR was 19cc at prior visit.     [facility]  He is being seen for Alzheimer's.  His wife is the caregiver and tells me that she had a hard time getting him here today.  Today he is denying any issues whatsoever.  The patient was wife tells me however that he had been complaining of some suprapubic discomfort and some discomfort with urination.  He denies that today however.    He missed appointments last year for cystoscopy secondary to an episode of gross blood.  The wife tells me at this time she does not think that he would be able to proceed with that sort of testing.    Past Medical History:   Diagnosis Date    Hypercholesterolemia     Hypertension     Personal history of prostate cancer      Past Surgical History:   Procedure Laterality Date    OTHER SURGICAL HISTORY      brachytherapy 3/04     Current Outpatient Medications   Medication Sig Dispense Refill    doxycycline monohydrate (ADOXA) 100 MG tablet Take 1 tablet by mouth 2 times daily for 7 days 14 tablet 0    finasteride (PROSCAR) 5 MG tablet Take 1 tablet by mouth daily 90 tablet 3    aspirin 81 MG EC tablet Take 325 mg by mouth daily       Cholecalciferol 50 MCG (2000 UT) CAPS Take by mouth 2 times daily      donepezil (ARICEPT) 5 MG tablet Take by mouth      doxycycline monohydrate (MONODOX) 100 MG capsule Take 100 mg by mouth 2 times daily      FLUoxetine HCl, PMDD, 20 MG TABS Take 20 mg by mouth daily       No current facility-administered medications for this visit.     Allergies   Allergen Reactions    Lisinopril Hives    Meloxicam Hives    Sulfa Antibiotics Rash     Social History     Socioeconomic History    Marital status: Married     Spouse name: Not on file    Number of children: Not on file    Years of education: Not on file    Highest education level: Not on file   Occupational History    Not on file   Tobacco Use    Smoking status: Never    Smokeless tobacco: Not on  file   Substance and Sexual Activity    Alcohol use: No    Drug use: Not on file    Sexual activity: Not on file   Other Topics Concern    Not on file   Social History Narrative    Not on file     Social Determinants of Health     Financial Resource Strain: Not on file   Food Insecurity: Not on file   Transportation Needs: Not on file   Physical Activity: Not on file   Stress: Not on file   Social Connections: Not on file   Intimate Partner Violence: Not on file   Housing Stability: Not on file     No family history on file.    ROS  See HPI      Urinalysis  UA - Dipstick  Results for orders placed or performed in visit on 01/24/23   AMB POC URINALYSIS DIP STICK AUTO W/O MICRO   Result Value Ref Range    Color (UA POC)      Clarity (UA POC)      Glucose, Urine, POC Negative     Bilirubin, Urine, POC Negative     KETONES, Urine, POC Negative     Specific Gravity, Urine, POC 1.020 1.001 - 1.035    Blood (UA POC) Small     pH, Urine, POC 5.5 4.6 - 8.0    Protein, Urine, POC 30     Urobilinogen, POC 0.2 mg/dL     Nitrite, Urine, POC Negative     Leukocyte Esterase, Urine, POC Small        PHYSICAL EXAM    GENERAL: No acute distress, Awake, Alert, Oriented X 3, Gait normal  ABDOMEN:  soft, non tender, non-distended, positive bowel sounds, no organomegaly, no palpable masses, no guarding, no rebound tenderness  SKIN: No rash, no erythema, no lacerations or abrasions, no ecchymosis  MUSCULOSKELETAL - MAEW, no edema     Assessment and Plan    ICD-10-CM    1. Malignant neoplasm of prostate (HCC)  C61 AMB POC URINALYSIS DIP STICK AUTO W/O MICRO     Culture, Urine      2. Gross hematuria  R31.0 Culture, Urine      3. Dysuria  R30.0 Culture, Urine        Prostate cancer-  Appears that the cancer is stable.     Dysuria/hematuria-  Culture the urine. Doxycycline will be given BID for 7 days. I will call them with culture results.   Restart finasteride.    Orders Placed This Encounter    Culture, Urine     Standing Status:   Future     Standing Expiration Date:   01/24/2024     Order Specific Question:   Specify (ex-cath, midstream, cysto, etc)?     Answer:   voided    AMB POC URINALYSIS DIP STICK AUTO W/O MICRO    doxycycline monohydrate (ADOXA) 100 MG tablet     Sig: Take 1 tablet by mouth 2 times daily for 7 days     Dispense:  14 tablet     Refill:  0    finasteride (PROSCAR) 5 MG tablet     Sig: Take 1 tablet by mouth daily     Dispense:  90 tablet     Refill:  3       Return in about 1 year (around 01/24/2024) for follow up with [place].    Total billable time associated with  this visit was 30  minutes, during which I coordinated care, counseled the patient and/or family, documented information in the electronic medical record, independently interpreted results, obtained history and performed a medically appropriate examination, ordered medications/tests, reviewed tests and communicated with other health care professionals regarding this patient.     Hilarie Fredrickson, ANP    [facility]   [address]  [place], [place]   [address]  Phone: [phone]  Fax: [phone]     Dr. Archie Patten was supervising physician today and approves plan of care.    Elements of this note have been  dictated using speech recognition software.  Although reviewed, errors of speech recognition may have occurred.

## 2023-01-26 DIAGNOSIS — J301 Allergic rhinitis due to pollen: Secondary | ICD-10-CM | POA: Diagnosis not present

## 2023-01-28 LAB — CULTURE, URINE
Culture: 10000
Culture: >2

## 2023-01-31 DIAGNOSIS — R519 Headache, unspecified: Secondary | ICD-10-CM | POA: Diagnosis not present

## 2023-01-31 DIAGNOSIS — M542 Cervicalgia: Secondary | ICD-10-CM | POA: Diagnosis not present

## 2023-01-31 DIAGNOSIS — M9903 Segmental and somatic dysfunction of lumbar region: Secondary | ICD-10-CM | POA: Diagnosis not present

## 2023-01-31 DIAGNOSIS — M9901 Segmental and somatic dysfunction of cervical region: Secondary | ICD-10-CM | POA: Diagnosis not present

## 2023-02-02 DIAGNOSIS — J301 Allergic rhinitis due to pollen: Secondary | ICD-10-CM | POA: Diagnosis not present

## 2023-02-07 DIAGNOSIS — J301 Allergic rhinitis due to pollen: Secondary | ICD-10-CM | POA: Diagnosis not present

## 2023-02-14 ENCOUNTER — Encounter: Payer: Self-pay | Admitting: Internal Medicine

## 2023-02-16 DIAGNOSIS — R1032 Left lower quadrant pain: Secondary | ICD-10-CM | POA: Diagnosis not present

## 2023-02-16 DIAGNOSIS — R350 Frequency of micturition: Secondary | ICD-10-CM | POA: Diagnosis not present

## 2023-02-16 DIAGNOSIS — J301 Allergic rhinitis due to pollen: Secondary | ICD-10-CM | POA: Diagnosis not present

## 2023-02-16 DIAGNOSIS — R109 Unspecified abdominal pain: Secondary | ICD-10-CM | POA: Diagnosis not present

## 2023-02-16 DIAGNOSIS — R399 Unspecified symptoms and signs involving the genitourinary system: Secondary | ICD-10-CM | POA: Diagnosis not present

## 2023-03-05 DIAGNOSIS — Z125 Encounter for screening for malignant neoplasm of prostate: Secondary | ICD-10-CM | POA: Diagnosis not present

## 2023-03-05 DIAGNOSIS — R7303 Prediabetes: Secondary | ICD-10-CM | POA: Diagnosis not present

## 2023-03-06 DIAGNOSIS — M542 Cervicalgia: Secondary | ICD-10-CM | POA: Diagnosis not present

## 2023-03-06 DIAGNOSIS — M9903 Segmental and somatic dysfunction of lumbar region: Secondary | ICD-10-CM | POA: Diagnosis not present

## 2023-03-06 DIAGNOSIS — R519 Headache, unspecified: Secondary | ICD-10-CM | POA: Diagnosis not present

## 2023-03-06 DIAGNOSIS — M9901 Segmental and somatic dysfunction of cervical region: Secondary | ICD-10-CM | POA: Diagnosis not present

## 2023-03-06 DIAGNOSIS — J4530 Mild persistent asthma, uncomplicated: Secondary | ICD-10-CM | POA: Diagnosis not present

## 2023-03-06 DIAGNOSIS — Z03818 Encounter for observation for suspected exposure to other biological agents ruled out: Secondary | ICD-10-CM | POA: Diagnosis not present

## 2023-03-06 DIAGNOSIS — U071 COVID-19: Secondary | ICD-10-CM | POA: Diagnosis not present

## 2023-03-12 DIAGNOSIS — Z961 Presence of intraocular lens: Secondary | ICD-10-CM | POA: Diagnosis not present

## 2023-03-12 DIAGNOSIS — H43813 Vitreous degeneration, bilateral: Secondary | ICD-10-CM | POA: Diagnosis not present

## 2023-03-14 DIAGNOSIS — J4530 Mild persistent asthma, uncomplicated: Secondary | ICD-10-CM | POA: Diagnosis not present

## 2023-03-14 DIAGNOSIS — N400 Enlarged prostate without lower urinary tract symptoms: Secondary | ICD-10-CM | POA: Diagnosis not present

## 2023-03-14 DIAGNOSIS — G4733 Obstructive sleep apnea (adult) (pediatric): Secondary | ICD-10-CM | POA: Diagnosis not present

## 2023-03-14 DIAGNOSIS — D696 Thrombocytopenia, unspecified: Secondary | ICD-10-CM | POA: Diagnosis not present

## 2023-03-14 DIAGNOSIS — Z0000 Encounter for general adult medical examination without abnormal findings: Secondary | ICD-10-CM | POA: Diagnosis not present

## 2023-03-14 DIAGNOSIS — K219 Gastro-esophageal reflux disease without esophagitis: Secondary | ICD-10-CM | POA: Diagnosis not present

## 2023-03-14 DIAGNOSIS — I480 Paroxysmal atrial fibrillation: Secondary | ICD-10-CM | POA: Diagnosis not present

## 2023-03-14 DIAGNOSIS — I700 Atherosclerosis of aorta: Secondary | ICD-10-CM | POA: Diagnosis not present

## 2023-03-14 DIAGNOSIS — I10 Essential (primary) hypertension: Secondary | ICD-10-CM | POA: Diagnosis not present

## 2023-03-14 DIAGNOSIS — R7303 Prediabetes: Secondary | ICD-10-CM | POA: Diagnosis not present

## 2023-03-23 DIAGNOSIS — J301 Allergic rhinitis due to pollen: Secondary | ICD-10-CM | POA: Diagnosis not present

## 2023-03-30 DIAGNOSIS — J301 Allergic rhinitis due to pollen: Secondary | ICD-10-CM | POA: Diagnosis not present

## 2023-04-03 DIAGNOSIS — M542 Cervicalgia: Secondary | ICD-10-CM | POA: Diagnosis not present

## 2023-04-03 DIAGNOSIS — M9903 Segmental and somatic dysfunction of lumbar region: Secondary | ICD-10-CM | POA: Diagnosis not present

## 2023-04-03 DIAGNOSIS — M9901 Segmental and somatic dysfunction of cervical region: Secondary | ICD-10-CM | POA: Diagnosis not present

## 2023-04-03 DIAGNOSIS — R519 Headache, unspecified: Secondary | ICD-10-CM | POA: Diagnosis not present

## 2023-04-06 DIAGNOSIS — J301 Allergic rhinitis due to pollen: Secondary | ICD-10-CM | POA: Diagnosis not present

## 2023-04-12 DIAGNOSIS — G4710 Hypersomnia, unspecified: Secondary | ICD-10-CM | POA: Diagnosis not present

## 2023-04-12 DIAGNOSIS — G4733 Obstructive sleep apnea (adult) (pediatric): Secondary | ICD-10-CM | POA: Diagnosis not present

## 2023-04-13 DIAGNOSIS — J301 Allergic rhinitis due to pollen: Secondary | ICD-10-CM | POA: Diagnosis not present

## 2023-04-17 ENCOUNTER — Other Ambulatory Visit: Payer: Self-pay | Admitting: Ophthalmology

## 2023-04-17 DIAGNOSIS — G453 Amaurosis fugax: Secondary | ICD-10-CM | POA: Diagnosis not present

## 2023-04-19 ENCOUNTER — Ambulatory Visit
Admission: RE | Admit: 2023-04-19 | Discharge: 2023-04-19 | Disposition: A | Discharge status: Home / Self Care | Payer: PPO | Source: Ambulatory Visit | Attending: Ophthalmology | Admitting: Ophthalmology

## 2023-04-19 DIAGNOSIS — J4530 Mild persistent asthma, uncomplicated: Secondary | ICD-10-CM | POA: Diagnosis not present

## 2023-04-19 DIAGNOSIS — G453 Amaurosis fugax: Secondary | ICD-10-CM | POA: Diagnosis not present

## 2023-04-19 DIAGNOSIS — J3089 Other allergic rhinitis: Secondary | ICD-10-CM | POA: Diagnosis not present

## 2023-04-20 DIAGNOSIS — J301 Allergic rhinitis due to pollen: Secondary | ICD-10-CM | POA: Diagnosis not present

## 2023-04-27 DIAGNOSIS — J301 Allergic rhinitis due to pollen: Secondary | ICD-10-CM | POA: Diagnosis not present

## 2023-04-29 DIAGNOSIS — B9789 Other viral agents as the cause of diseases classified elsewhere: Secondary | ICD-10-CM | POA: Diagnosis not present

## 2023-04-29 DIAGNOSIS — J019 Acute sinusitis, unspecified: Secondary | ICD-10-CM | POA: Diagnosis not present

## 2023-04-29 DIAGNOSIS — Z03818 Encounter for observation for suspected exposure to other biological agents ruled out: Secondary | ICD-10-CM | POA: Diagnosis not present

## 2023-04-29 DIAGNOSIS — J4521 Mild intermittent asthma with (acute) exacerbation: Secondary | ICD-10-CM | POA: Diagnosis not present

## 2023-05-01 DIAGNOSIS — M9901 Segmental and somatic dysfunction of cervical region: Secondary | ICD-10-CM | POA: Diagnosis not present

## 2023-05-01 DIAGNOSIS — M9903 Segmental and somatic dysfunction of lumbar region: Secondary | ICD-10-CM | POA: Diagnosis not present

## 2023-05-01 DIAGNOSIS — M542 Cervicalgia: Secondary | ICD-10-CM | POA: Diagnosis not present

## 2023-05-01 DIAGNOSIS — R519 Headache, unspecified: Secondary | ICD-10-CM | POA: Diagnosis not present

## 2023-05-02 DIAGNOSIS — J301 Allergic rhinitis due to pollen: Secondary | ICD-10-CM | POA: Diagnosis not present

## 2023-05-04 DIAGNOSIS — J301 Allergic rhinitis due to pollen: Secondary | ICD-10-CM | POA: Diagnosis not present

## 2023-05-11 DIAGNOSIS — H6982 Other specified disorders of Eustachian tube, left ear: Secondary | ICD-10-CM | POA: Diagnosis not present

## 2023-05-11 DIAGNOSIS — J3489 Other specified disorders of nose and nasal sinuses: Secondary | ICD-10-CM | POA: Diagnosis not present

## 2023-05-11 DIAGNOSIS — J0100 Acute maxillary sinusitis, unspecified: Secondary | ICD-10-CM | POA: Diagnosis not present

## 2023-05-11 DIAGNOSIS — J301 Allergic rhinitis due to pollen: Secondary | ICD-10-CM | POA: Diagnosis not present

## 2023-05-18 DIAGNOSIS — J301 Allergic rhinitis due to pollen: Secondary | ICD-10-CM | POA: Diagnosis not present

## 2023-05-22 ENCOUNTER — Encounter: Payer: Self-pay | Admitting: Internal Medicine

## 2023-05-23 ENCOUNTER — Encounter
Admission: RE | Disposition: A | Discharge status: Home / Self Care | Payer: Self-pay | Source: Home / Self Care | Attending: Internal Medicine

## 2023-05-23 ENCOUNTER — Ambulatory Visit: Payer: PPO | Admitting: Anesthesiology

## 2023-05-23 ENCOUNTER — Ambulatory Visit
Admission: RE | Admit: 2023-05-23 | Discharge: 2023-05-23 | Disposition: A | Discharge status: Home / Self Care | Payer: PPO | Attending: Internal Medicine | Admitting: Internal Medicine

## 2023-05-23 DIAGNOSIS — G4730 Sleep apnea, unspecified: Secondary | ICD-10-CM | POA: Insufficient documentation

## 2023-05-23 DIAGNOSIS — K5730 Diverticulosis of large intestine without perforation or abscess without bleeding: Secondary | ICD-10-CM | POA: Insufficient documentation

## 2023-05-23 DIAGNOSIS — K2970 Gastritis, unspecified, without bleeding: Secondary | ICD-10-CM | POA: Insufficient documentation

## 2023-05-23 DIAGNOSIS — D123 Benign neoplasm of transverse colon: Secondary | ICD-10-CM | POA: Insufficient documentation

## 2023-05-23 DIAGNOSIS — Z79899 Other long term (current) drug therapy: Secondary | ICD-10-CM | POA: Insufficient documentation

## 2023-05-23 DIAGNOSIS — K648 Other hemorrhoids: Secondary | ICD-10-CM | POA: Diagnosis not present

## 2023-05-23 DIAGNOSIS — Z09 Encounter for follow-up examination after completed treatment for conditions other than malignant neoplasm: Secondary | ICD-10-CM | POA: Insufficient documentation

## 2023-05-23 DIAGNOSIS — I4891 Unspecified atrial fibrillation: Secondary | ICD-10-CM | POA: Diagnosis not present

## 2023-05-23 DIAGNOSIS — K2289 Other specified disease of esophagus: Secondary | ICD-10-CM | POA: Diagnosis not present

## 2023-05-23 DIAGNOSIS — Z980 Intestinal bypass and anastomosis status: Secondary | ICD-10-CM | POA: Diagnosis not present

## 2023-05-23 DIAGNOSIS — I10 Essential (primary) hypertension: Secondary | ICD-10-CM | POA: Insufficient documentation

## 2023-05-23 DIAGNOSIS — K640 First degree hemorrhoids: Secondary | ICD-10-CM | POA: Insufficient documentation

## 2023-05-23 DIAGNOSIS — K3189 Other diseases of stomach and duodenum: Secondary | ICD-10-CM | POA: Diagnosis not present

## 2023-05-23 DIAGNOSIS — K635 Polyp of colon: Secondary | ICD-10-CM | POA: Diagnosis not present

## 2023-05-23 DIAGNOSIS — K219 Gastro-esophageal reflux disease without esophagitis: Secondary | ICD-10-CM | POA: Diagnosis not present

## 2023-05-23 HISTORY — PX: BIOPSY: SHX5522

## 2023-05-23 HISTORY — PX: ESOPHAGOGASTRODUODENOSCOPY: SHX5428

## 2023-05-23 HISTORY — PX: POLYPECTOMY: SHX5525

## 2023-05-23 HISTORY — PX: COLONOSCOPY WITH PROPOFOL: SHX5780

## 2023-05-23 SURGERY — COLONOSCOPY WITH PROPOFOL
Anesthesia: General

## 2023-05-23 MED ORDER — SODIUM CHLORIDE 0.9 % IV SOLN
INTRAVENOUS | Status: DC
  Administered 2023-05-23: 20 mL/h via INTRAVENOUS

## 2023-05-23 MED ORDER — PROPOFOL 10 MG/ML IV BOLUS
INTRAVENOUS | Status: DC | PRN
  Administered 2023-05-23 (×2): 50 mg via INTRAVENOUS

## 2023-05-23 MED ORDER — PROPOFOL 500 MG/50ML IV EMUL
INTRAVENOUS | Status: DC | PRN
  Administered 2023-05-23: 125 ug/kg/min via INTRAVENOUS

## 2023-05-23 NOTE — Anesthesia Preprocedure Evaluation (Addendum)
Anesthesia Evaluation  Patient identified by MRN, date of birth, ID band Patient awake    Reviewed: Allergy & Precautions, NPO status , Patient's Chart, lab work & pertinent test results  History of Anesthesia Complications Negative for: history of anesthetic complications  Airway Mallampati: I  TM Distance: >3 FB Neck ROM: full    Dental   Pulmonary asthma , sleep apnea    Pulmonary exam normal        Cardiovascular hypertension, On Medications + dysrhythmias Atrial Fibrillation      Neuro/Psych negative neurological ROS  negative psych ROS   GI/Hepatic Neg liver ROS,GERD  Medicated,,  Endo/Other   Hyperthyroidism   Renal/GU      Musculoskeletal   Abdominal   Peds  Hematology negative hematology ROS (+)   Anesthesia Other Findings Past Medical History: No date: Asthma     Comment:  exercise induced No date: BPH (benign prostatic hyperplasia) No date: Diverticulitis No date: Diverticulosis No date: Fatty liver No date: GERD (gastroesophageal reflux disease) No date: History of kidney stones No date: Hx of atrial fibrillation without current medication No date: Hypertension No date: Hyperthyroidism     Comment:  Multinodular Goiter No date: Sleep apnea     Comment:  No CPAP No date: Thrombocytopenia (HCC)  Past Surgical History: 07/13/2021: CATARACT EXTRACTION W/PHACO; Right     Comment:  Procedure: CATARACT EXTRACTION PHACO AND INTRAOCULAR               LENS PLACEMENT (IOC) RIGHT 2.92 00:50.2;  Surgeon:               Lockie Mola, MD;  Location: [facility];              Service: Ophthalmology;  Laterality: Right; 07/27/2021: CATARACT EXTRACTION W/PHACO; Left     Comment:  Procedure: CATARACT EXTRACTION PHACO AND INTRAOCULAR               LENS PLACEMENT (IOC) LEFT 3.67 00:39.7;  Surgeon:               Lockie Mola, MD;  Location: [facility];              Service:  Ophthalmology;  Laterality: Left; No date: CHOLECYSTECTOMY No date: COLON RESECTION 03/31/2019: COLONOSCOPY WITH PROPOFOL; N/A     Comment:  Procedure: COLONOSCOPY WITH PROPOFOL;  Surgeon: [place],               Boykin Nearing, MD;  Location: [facility] ENDOSCOPY;  Service:               Endoscopy;  Laterality: N/A; 08/16/2016: ESOPHAGOGASTRODUODENOSCOPY (EGD) WITH PROPOFOL; N/A     Comment:  Procedure: ESOPHAGOGASTRODUODENOSCOPY (EGD) WITH               PROPOFOL;  Surgeon: Scot Jun, MD;  Location: [facility]              ENDOSCOPY;  Service: Endoscopy;  Laterality: N/A; 03/31/2019: ESOPHAGOGASTRODUODENOSCOPY (EGD) WITH PROPOFOL; N/A     Comment:  Procedure: ESOPHAGOGASTRODUODENOSCOPY (EGD) WITH               PROPOFOL;  Surgeon: Toledo, Boykin Nearing, MD;  Location:               [facility] ENDOSCOPY;  Service: Endoscopy;  Laterality: N/A; 05/15/2019: EXTRACORPOREAL SHOCK WAVE LITHOTRIPSY; Left     Comment:  Procedure: EXTRACORPOREAL SHOCK WAVE LITHOTRIPSY (ESWL);  Surgeon: Sondra Come, MD;  Location: [facility] ORS;                Service: Urology;  Laterality: Left; No date: TRANSURETHRAL RESECTION OF PROSTATE     Reproductive/Obstetrics negative OB ROS                             Anesthesia Physical Anesthesia Plan  ASA: 3  Anesthesia Plan: General LMA   Post-op Pain Management: Minimal or no pain anticipated   Induction: Intravenous  PONV Risk Score and Plan: 1 and Propofol infusion, TIVA and Treatment may vary due to age or medical condition  Airway Management Planned: Natural Airway and Nasal Cannula  Additional Equipment:   Intra-op Plan:   Post-operative Plan:   Informed Consent: I have reviewed the patients History and Physical, chart, labs and discussed the procedure including the risks, benefits and alternatives for the proposed anesthesia with the patient or authorized representative who has indicated his/her understanding and acceptance.      Dental Advisory Given  Plan Discussed with: Anesthesiologist, CRNA and Surgeon  Anesthesia Plan Comments: (Patient consented for risks of anesthesia including but not limited to:  - adverse reactions to medications - risk of airway placement if required - damage to eyes, teeth, lips or other oral mucosa - nerve damage due to positioning  - sore throat or hoarseness - Damage to heart, brain, nerves, lungs, other parts of body or loss of life  Patient voiced understanding.)       Anesthesia Quick Evaluation

## 2023-05-23 NOTE — Interval H&P Note (Signed)
History and Physical Interval Note:  05/23/2023 11:10 AM  Jimmy Obrien  has presented today for surgery, with the diagnosis of 562.11 (ICD-9-CM) - K57.92 (ICD-10-CM) - Diverticulitis GERD.  The various methods of treatment have been discussed with the patient and family. After consideration of risks, benefits and other options for treatment, the patient has consented to  Procedure(s): COLONOSCOPY WITH PROPOFOL (N/A) ESOPHAGOGASTRODUODENOSCOPY (EGD) (N/A) as a surgical intervention.  The patient's history has been reviewed, patient examined, no change in status, stable for surgery.  I have reviewed the patient's chart and labs.  Questions were answered to the patient's satisfaction.     [place], [place]

## 2023-05-23 NOTE — Op Note (Signed)
[facility] Gastroenterology Patient Name: Jimmy Obrien Procedure Date: 05/23/2023 11:08 AM MRN: [id number] Account #: [id number] Date of Birth: 08/19/1946 Admit Type: Outpatient Age: 77 Room: [facility] ENDO ROOM 2 Gender: Male Note Status: Finalized Instrument Name: Colonoscope [id number] Procedure:             Colonoscopy Indications:           Follow-up of diverticulitis Providers:             Boykin Nearing. Mitchel Delduca MD, MD Medicines:             Propofol per Anesthesia Complications:         No immediate complications. Estimated blood loss:                         Minimal. Procedure:             Pre-Anesthesia Assessment:                        - The risks and benefits of the procedure and the                         sedation options and risks were discussed with the                         patient. All questions were answered and informed                         consent was obtained.                        - Patient identification and proposed procedure were                         verified prior to the procedure by the nurse. The                         procedure was verified in the procedure room.                        - ASA Grade Assessment: III - A patient with severe                         systemic disease.                        - After reviewing the risks and benefits, the patient                         was deemed in satisfactory condition to undergo the                         procedure.                        After obtaining informed consent, the colonoscope was                         passed under direct vision. Throughout the procedure,  the patient's blood pressure, pulse, and oxygen                         saturations were monitored continuously. The                         Colonoscope was introduced through the anus and                         advanced to the the ileocolonic anastomosis. The                         colonoscopy was  performed without difficulty. The                         patient tolerated the procedure well. The quality of                         the bowel preparation was adequate. Ileocolonic                         anastomosis and neoterminal ileum were photographed. Findings:      The perianal and digital rectal examinations were normal. Pertinent       negatives include normal sphincter tone and no palpable rectal lesions.      Non-bleeding internal hemorrhoids were found during retroflexion. The       hemorrhoids were Grade I (internal hemorrhoids that do not prolapse).      There was evidence of a prior end-to-side colo-colonic anastomosis in       the ascending colon. This was patent and was characterized by healthy       appearing mucosa. The anastomosis was traversed.      The neo-terminal ileum appeared normal.      Three sessile polyps were found in the transverse colon. The polyps were       3 to 5 mm in size. These polyps were removed with a jumbo cold forceps.       Resection and retrieval were complete.      A few medium-mouthed diverticula were found in the sigmoid colon.      The exam was otherwise without abnormality. Impression:            - Non-bleeding internal hemorrhoids.                        - Patent end-to-side colo-colonic anastomosis,                         characterized by healthy appearing mucosa.                        - The examined portion of the ileum was normal.                        - Three 3 to 5 mm polyps in the transverse colon,                         removed with a jumbo cold forceps. Resected and  retrieved.                        - Diverticulosis in the sigmoid colon.                        - The examination was otherwise normal. Recommendation:        - Await pathology results from EGD, also performed                         today.                        - Discontinue Carafate, Continue Protonix 40mg  twice                          daily.                        - Repeat colonoscopy in 3 - 5 years for surveillance                         based on pathology results.                        - Follow up with Tawni Pummel, PA-C at [facility]. [phone].                        - Telephone GI office to schedule appointment.                        - The findings and recommendations were discussed with                         the patient. Procedure Code(s):     --- Professional ---                        [phone], Colonoscopy, flexible; with biopsy, single or                         multiple Diagnosis Code(s):     --- Professional ---                        K57.30, Diverticulosis of large intestine without                         perforation or abscess without bleeding                        K57.32, Diverticulitis of large intestine without                         perforation or abscess without bleeding                        D12.3, Benign neoplasm of transverse colon (hepatic  flexure or splenic flexure)                        K64.0, First degree hemorrhoids                        Z98.0, Intestinal bypass and anastomosis status CPT copyright 2022 American Medical Association. All rights reserved. The codes documented in this report are preliminary and upon coder review may  be revised to meet current compliance requirements. Stanton Kidney MD, MD 05/23/2023 11:49:49 AM This report has been signed electronically. Number of Addenda: 0 Note Initiated On: 05/23/2023 11:08 AM Total Procedure Duration: 0 hours 10 minutes 30 seconds  Estimated Blood Loss:  Estimated blood loss was minimal. Estimated blood loss                         was minimal.      [facility]

## 2023-05-23 NOTE — Anesthesia Postprocedure Evaluation (Signed)
Anesthesia Post Note  Patient: Jimmy Obrien  Procedure(s) Performed: COLONOSCOPY WITH PROPOFOL ESOPHAGOGASTRODUODENOSCOPY (EGD) BIOPSY POLYPECTOMY  Patient location during evaluation: Endoscopy Anesthesia Type: General Level of consciousness: awake and alert Pain management: pain level controlled Vital Signs Assessment: post-procedure vital signs reviewed and stable Respiratory status: spontaneous breathing, nonlabored ventilation, respiratory function stable and patient connected to nasal cannula oxygen Cardiovascular status: blood pressure returned to baseline and stable Postop Assessment: no apparent nausea or vomiting Anesthetic complications: no   No notable events documented.   Last Vitals:  Vitals:   05/23/23 1147 05/23/23 1157  BP: 104/66 109/73  Pulse: (!) 51 (!) 49  Resp: 12 14  Temp: (!) 35.7 C   SpO2: 100% 100%    Last Pain:  Vitals:   05/23/23 1157  TempSrc:   PainSc: 0-No pain                 Louie Boston

## 2023-05-23 NOTE — Transfer of Care (Signed)
Immediate Anesthesia Transfer of Care Note  Patient: Jimmy Obrien  Procedure(s) Performed: COLONOSCOPY WITH PROPOFOL ESOPHAGOGASTRODUODENOSCOPY (EGD) BIOPSY POLYPECTOMY  Patient Location: PACU  Anesthesia Type:General  Level of Consciousness: drowsy  Airway & Oxygen Therapy: Patient Spontanous Breathing and Patient connected to nasal cannula oxygen  Post-op Assessment: Report given to RN, Post -op Vital signs reviewed and stable, and Patient moving all extremities  Post vital signs: Reviewed and stable  Last Vitals:  Vitals Value Taken Time  BP 104/66 05/23/23 1147  Temp 35.7 C 05/23/23 1147  Pulse 52 05/23/23 1147  Resp 12 05/23/23 1147  SpO2 98 % 05/23/23 1147  Vitals shown include unfiled device data.  Last Pain:  Vitals:   05/23/23 1147  TempSrc: Temporal  PainSc: Asleep         Complications: No notable events documented.

## 2023-05-23 NOTE — Op Note (Signed)
[facility] Gastroenterology Patient Name: Jimmy Obrien Procedure Date: 05/23/2023 11:08 AM MRN: [id number] Account #: [id number] Date of Birth: 10-19-1945 Admit Type: Outpatient Age: 77 Room: [facility] ENDO ROOM 2 Gender: Male Note Status: Finalized Instrument Name: Upper Endoscope [id number] Procedure:             Upper GI endoscopy Indications:           Suspected gastro-esophageal reflux disease, Failure to                         respond to medical treatment Providers:             Boykin Nearing. Keyondra Lagrand MD, MD Medicines:             Propofol per Anesthesia Complications:         No immediate complications. Estimated blood loss:                         Minimal. Procedure:             Pre-Anesthesia Assessment:                        - The risks and benefits of the procedure and the                         sedation options and risks were discussed with the                         patient. All questions were answered and informed                         consent was obtained.                        - Patient identification and proposed procedure were                         verified prior to the procedure by the nurse. The                         procedure was verified in the procedure room.                        - ASA Grade Assessment: III - A patient with severe                         systemic disease.                        - After reviewing the risks and benefits, the patient                         was deemed in satisfactory condition to undergo the                         procedure.                        After obtaining informed consent, the endoscope was  passed under direct vision. Throughout the procedure,                         the patient's blood pressure, pulse, and oxygen                         saturations were monitored continuously. The                         Endosonoscope was introduced through the mouth, and                          advanced to the third part of duodenum. The upper GI                         endoscopy was accomplished without difficulty. The                         patient tolerated the procedure well. Findings:      The examined esophagus was normal. 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      Diffuse mild inflammation characterized by congestion (edema), erythema       and friability was found in the entire examined stomach.      The exam of the stomach was otherwise normal.      The examined duodenum was normal. Impression:            - Normal esophagus.                        - Gastritis.                        - Normal examined duodenum.                        - Biopsies were taken with a cold forceps for                         evaluation of eosinophilic esophagitis. Recommendation:        - Await pathology results.                        - Discontinue Carafate, continue Protonix 40 mg twice                         daily.                        - Follow up with Tawni Pummel, PA-C at [facility]. [phone].                        - Telephone GI office to schedule appointment.                        - Proceed with colonoscopy Procedure Code(s):     ---  Professional ---                        [phone], Esophagogastroduodenoscopy, flexible,                         transoral; with biopsy, single or multiple Diagnosis Code(s):     --- Professional ---                        K29.70, Gastritis, unspecified, without bleeding CPT copyright 2022 American Medical Association. All rights reserved. The codes documented in this report are preliminary and upon coder review may  be revised to meet current compliance requirements. Stanton Kidney MD, MD 05/23/2023 11:28:35 AM This report has been signed electronically. Number of Addenda: 0 Note  Initiated On: 05/23/2023 11:08 AM Estimated Blood Loss:  Estimated blood loss was minimal.      [facility]

## 2023-05-23 NOTE — H&P (Signed)
Outpatient short stay form Pre-procedure 05/23/2023 11:08 AM Jimmy Obrien K. Jimmy Obrien, M.D.  Primary Physician: Jimmy Obrien, M.D.  Reason for visit:  GERD, diverticulitis  History of present illness:  [name] reports his abdominal pain drastically improving with the most recent antibiotic regimen, he completed the whole course of Flagyl and about half of the Levaquin. He is no longer having any lower abdominal pain. He reports having 2-3 bowel movements per day. Typically feels that he moves his stools well. He denies any rectal bleeding or melena.  He does note since being on the antibiotics he is having more burning and indigestion symptoms. He also feels some mild bloating. He is on Protonix 40 mg twice a day. He started Pepcid initially for symptoms and did not notice a big improvement but started some Carafate that he had at home last week and this seems to be helping symptoms much more. He denies any nausea vomiting or dysphagia. Feels some bloating in epigastric area.     Current Facility-Administered Medications:    0.9 %  sodium chloride infusion, , Intravenous, Continuous, Jimmy Obrien, Jimmy Nearing, MD, Last Rate: 20 mL/hr at 05/23/23 1022, 20 mL/hr at 05/23/23 1022  Medications Prior to Admission  Medication Sig Dispense Refill Last Dose   acetaminophen (TYLENOL) 650 MG CR tablet Take 1,300 mg by mouth 2 (two) times daily.   05/22/2023   albuterol (PROVENTIL HFA;VENTOLIN HFA) 108 (90 Base) MCG/ACT inhaler Inhale 2 puffs into the lungs every 6 (six) hours as needed for wheezing or shortness of breath.   05/22/2023   amphetamine-dextroamphetamine (ADDERALL) 10 MG tablet Take 10 mg by mouth daily with breakfast.   05/22/2023   Ascorbic Acid (VITAMIN C) 1000 MG tablet Take 1,000 mg by mouth 2 (two) times daily.   05/22/2023   aspirin EC 81 MG tablet Take 81 mg by mouth daily.   Past Week   atenolol (TENORMIN) 25 MG tablet Take 12.5 mg by mouth daily.   05/23/2023 at 0700   azelastine (ASTELIN) 0.1 %  nasal spray Place 1 spray into both nostrils 2 (two) times daily. Use in each nostril as directed   05/22/2023   budesonide (RHINOCORT AQUA) 32 MCG/ACT nasal spray Place 1 spray into both nostrils daily.   05/22/2023   Camphor-Menthol-Methyl Sal (FLEXALL ULTRA PLUS EX) Apply 1 application topically as needed.   05/22/2023   Cholecalciferol 10000 units CAPS Take 2,000 Units by mouth 1 day or 1 dose.   05/22/2023   citalopram (CELEXA) 10 MG tablet    05/22/2023   cyanocobalamin 1000 MCG tablet Take 1,000 mcg by mouth in the morning and at bedtime.   05/22/2023   Dapsone (ACZONE) 7.5 % GEL Apply to face twice a day 60 g 2 05/22/2023   diazepam (VALIUM) 5 MG tablet Take 5 mg by mouth 2 (two) times daily as needed.   05/22/2023   DUTASTERIDE-TAMSULOSIN HCL PO Take by mouth daily.   05/22/2023   FLOVENT HFA 110 MCG/ACT inhaler    05/22/2023   gentamicin ointment (GARAMYCIN) 0.1 % Apply 1 application topically 2 (two) times daily. In nose   05/22/2023   halobetasol (ULTRAVATE) 0.05 % cream Apply topically as directed for 14 days, ONCE A DAY up to 5 days a week to AFFECTED AREAS OF psoriasis on hands AS NEEDED FOR flares. Avoid face, groin, axilla 50 g 0 05/22/2023   meloxicam (MOBIC) 7.5 MG tablet Take 7.5 mg by mouth daily.   05/22/2023   methimazole (TAPAZOLE) 5 MG  tablet Take 5 mg by mouth daily.   05/22/2023   metroNIDAZOLE (METROCREAM) 0.75 % cream Apply topically at bedtime. TO FACE FOR ROSACEA 45 g 11 05/22/2023   montelukast (SINGULAIR) 10 MG tablet    05/22/2023   Multiple Vitamin (MULTIVITAMIN) tablet Take 1 tablet by mouth daily.   05/22/2023   pantoprazole (PROTONIX) 40 MG tablet Take 40 mg by mouth 2 (two) times daily.   05/22/2023   polyethylene glycol (MIRALAX) 17 g packet Take 17 g by mouth daily. 14 each 0 05/22/2023   senna-docusate (SENOKOT-S) 8.6-50 MG tablet Take 2 tablets by mouth 2 (two) times daily. 30 tablet 0 05/22/2023   silodosin (RAPAFLO) 8 MG CAPS capsule Take 8 mg by mouth daily with  breakfast.   05/22/2023   Simethicone 180 MG CAPS Take 125 mg by mouth every 6 (six) hours as needed for flatulence. (Phazyme)   05/22/2023   sucralfate (CARAFATE) 1 g tablet Take 1 g by mouth 3 (three) times daily with meals as needed.   05/22/2023   Vitamin A 2400 MCG (8000 UT) TABS Take by mouth daily.   05/22/2023   zinc gluconate 50 MG tablet Take 50 mg by mouth daily.   05/22/2023   levocetirizine (XYZAL) 5 MG tablet Take 5 mg by mouth every evening. (Patient not taking: Reported on 06/29/2021)        Allergies  Allergen Reactions   Levaquin [Levofloxacin]    Ciprofloxacin Other (See Comments)    Joint aches    Other Rash    Other reaction(s): Unknown Inflammation   Penicillins Rash   Sulfa Antibiotics Rash   Tetracyclines & Related Other (See Comments)    Inflammation      Past Medical History:  Diagnosis Date   Asthma    exercise induced   BPH (benign prostatic hyperplasia)    Diverticulitis    Diverticulosis    Fatty liver    GERD (gastroesophageal reflux disease)    History of kidney stones    Hx of atrial fibrillation without current medication    Hypertension    Hyperthyroidism    Multinodular Goiter   Sleep apnea    No CPAP   Thrombocytopenia (HCC)     Review of systems:  Otherwise negative.    Physical Exam  Gen: Alert, oriented. Appears stated age.  HEENT: [place]/AT. PERRLA. Lungs: CTA, no wheezes. CV: RR nl S1, S2. Abd: soft, benign, no masses. BS+ Ext: No edema. Pulses 2+    Planned procedures: Proceed with EGD and colonoscopy. The patient understands the nature of the planned procedure, indications, risks, alternatives and potential complications including but not limited to bleeding, infection, perforation, damage to internal organs and possible oversedation/side effects from anesthesia. The patient agrees and gives consent to proceed.  Please refer to procedure notes for findings, recommendations and patient disposition/instructions.      Jimmy Obrien K. Jimmy Obrien, M.D. Gastroenterology 05/23/2023  11:08 AM

## 2023-05-24 ENCOUNTER — Encounter: Payer: Self-pay | Admitting: Internal Medicine

## 2023-05-24 LAB — SURGICAL PATHOLOGY

## 2023-05-29 DIAGNOSIS — M542 Cervicalgia: Secondary | ICD-10-CM | POA: Diagnosis not present

## 2023-05-29 DIAGNOSIS — M9901 Segmental and somatic dysfunction of cervical region: Secondary | ICD-10-CM | POA: Diagnosis not present

## 2023-05-29 DIAGNOSIS — J328 Other chronic sinusitis: Secondary | ICD-10-CM | POA: Diagnosis not present

## 2023-05-29 DIAGNOSIS — M9903 Segmental and somatic dysfunction of lumbar region: Secondary | ICD-10-CM | POA: Diagnosis not present

## 2023-05-29 DIAGNOSIS — R519 Headache, unspecified: Secondary | ICD-10-CM | POA: Diagnosis not present

## 2023-05-29 DIAGNOSIS — J3489 Other specified disorders of nose and nasal sinuses: Secondary | ICD-10-CM | POA: Diagnosis not present

## 2023-05-30 ENCOUNTER — Ambulatory Visit (INDEPENDENT_AMBULATORY_CARE_PROVIDER_SITE_OTHER): Payer: PPO | Admitting: Dermatology

## 2023-05-30 ENCOUNTER — Encounter: Payer: Self-pay | Admitting: Dermatology

## 2023-05-30 VITALS — BP 127/80

## 2023-05-30 DIAGNOSIS — D1801 Hemangioma of skin and subcutaneous tissue: Secondary | ICD-10-CM

## 2023-05-30 DIAGNOSIS — L710 Perioral dermatitis: Secondary | ICD-10-CM | POA: Diagnosis not present

## 2023-05-30 DIAGNOSIS — L719 Rosacea, unspecified: Secondary | ICD-10-CM | POA: Diagnosis not present

## 2023-05-30 DIAGNOSIS — Z1283 Encounter for screening for malignant neoplasm of skin: Secondary | ICD-10-CM | POA: Diagnosis not present

## 2023-05-30 DIAGNOSIS — L821 Other seborrheic keratosis: Secondary | ICD-10-CM

## 2023-05-30 DIAGNOSIS — L409 Psoriasis, unspecified: Secondary | ICD-10-CM | POA: Diagnosis not present

## 2023-05-30 DIAGNOSIS — Z79899 Other long term (current) drug therapy: Secondary | ICD-10-CM

## 2023-05-30 DIAGNOSIS — L570 Actinic keratosis: Secondary | ICD-10-CM | POA: Diagnosis not present

## 2023-05-30 DIAGNOSIS — L820 Inflamed seborrheic keratosis: Secondary | ICD-10-CM

## 2023-05-30 DIAGNOSIS — N481 Balanitis: Secondary | ICD-10-CM | POA: Diagnosis not present

## 2023-05-30 DIAGNOSIS — L814 Other melanin hyperpigmentation: Secondary | ICD-10-CM

## 2023-05-30 DIAGNOSIS — D229 Melanocytic nevi, unspecified: Secondary | ICD-10-CM

## 2023-05-30 DIAGNOSIS — W908XXA Exposure to other nonionizing radiation, initial encounter: Secondary | ICD-10-CM

## 2023-05-30 DIAGNOSIS — L578 Other skin changes due to chronic exposure to nonionizing radiation: Secondary | ICD-10-CM | POA: Diagnosis not present

## 2023-05-30 DIAGNOSIS — Z7189 Other specified counseling: Secondary | ICD-10-CM

## 2023-05-30 NOTE — Progress Notes (Signed)
Follow-Up Visit   Subjective  Jimmy Obrien is a 77 y.o. male who presents for the following: Skin Cancer Screening and Full Body Skin Exam, check spots bil legs, no symptoms, check spot R forehead, skin tags neck irritating, Rosacea face, metronidazole cr prn, has not taken doxycycline in a while since rosacea has not been flaring, hx of perioral derm, clear, no treatment at this time, Psoriasis R palm resolved, rash groin, tried metronidazole cr and improved  The patient presents for Total-Body Skin Exam (TBSE) for skin cancer screening and mole check. The patient has spots, moles and lesions to be evaluated, some may be new or changing and the patient may have concern these could be cancer.    The following portions of the chart were reviewed this encounter and updated as appropriate: medications, allergies, medical history  Review of Systems:  No other skin or systemic complaints except as noted in HPI or Assessment and Plan.  Objective  Well appearing patient in no apparent distress; mood and affect are within normal limits.  A full examination was performed including scalp, head, eyes, ears, nose, lips, neck, chest, axillae, abdomen, back, buttocks, bilateral upper extremities, bilateral lower extremities, hands, feet, fingers, toes, fingernails, and toenails. All findings within normal limits unless otherwise noted below.   Relevant physical exam findings are noted in the Assessment and Plan.  R temple x 2, scalp x 1 (3) Pink scaly macules  neck x 25 (25) Stuck on waxy paps with erythema    Assessment & Plan   SKIN CANCER SCREENING PERFORMED TODAY.  ACTINIC DAMAGE - Chronic condition, secondary to cumulative UV/sun exposure - diffuse scaly erythematous macules with underlying dyspigmentation - Recommend daily broad spectrum sunscreen SPF 30+ to sun-exposed areas, reapply every 2 hours as needed.  - Staying in the shade or wearing long sleeves, sun glasses (UVA+UVB  protection) and wide brim hats (4-inch brim around the entire circumference of the hat) are also recommended for sun protection.  - Call for new or changing lesions.  LENTIGINES, SEBORRHEIC KERATOSES, HEMANGIOMAS - Benign normal skin lesions - Benign-appearing - Call for any changes  MELANOCYTIC NEVI - Tan-brown and/or pink-flesh-colored symmetric macules and papules - Benign appearing on exam today - Observation - Call clinic for new or changing moles - Recommend daily use of broad spectrum spf 30+ sunscreen to sun-exposed areas.   ROSACEA Exam erythema face  Chronic condition with duration or expected duration over one year. Currently well-controlled.   Rosacea is a chronic progressive skin condition usually affecting the face of adults, causing redness and/or acne bumps. It is treatable but not curable. It sometimes affects the eyes (ocular rosacea) as well. It may respond to topical and/or systemic medication and can flare with stress, sun exposure, alcohol, exercise, topical steroids (including hydrocortisone/cortisone 10) and some foods.  Daily application of broad spectrum spf 30+ sunscreen to face is recommended to reduce flares.  Patient denies grittiness of the eyes  Treatment Plan PRN flares restart Metronidazole cr qhs PRN flares restart Doxycycline 20mg  1 po qd with food and drink  Doxycycline should be taken with food to prevent nausea. Do not lay down for 30 minutes after taking. Be cautious with sun exposure and use good sun protection while on this medication. Pregnant women should not take this medication.     PERIORAL DERMATITIS Exam: perioral clear today  Treatment Plan: Prn flares restart Dapsone 7.5% gel qd/bid Prn flares restart Doxycycline 20mg  1 po qd  PSORIASIS R  arm, hand Exam: arm and hand clear today  0% BSA.  Chronic condition with duration or expected duration over one year. Currently well-controlled.   patient denies joint pain  Psoriasis  is a chronic non-curable, but treatable genetic/hereditary disease that may have other systemic features affecting other organ systems such as joints (Psoriatic Arthritis). It is associated with an increased risk of inflammatory bowel disease, heart disease, non-alcoholic fatty liver disease, and depression.  Treatments include light and laser treatments; topical medications; and systemic medications including oral and injectables.  Treatment Plan: Prn flares restart Halobetasol cr qd up to 5d/wk aa hand/arm until clear, then prn flares, avoid f/g/a  Topical steroids (such as triamcinolone, fluocinolone, fluocinonide, mometasone, clobetasol, halobetasol, betamethasone, hydrocortisone) can cause thinning and lightening of the skin if they are used for too long in the same area. Your physician has selected the right strength medicine for your problem and area affected on the body. Please use your medication only as directed by your physician to prevent side effects.     BALANITIS groin Exam: erythema dorsum proximal glands  Treatment Plan: Start Opzelura cream qhs until clear, sample x 1,Lot [id number] exp 12/2023  AK (actinic keratosis) (3) R temple x 2, scalp x 1  Actinic keratoses are precancerous spots that appear secondary to cumulative UV radiation exposure/sun exposure over time. They are chronic with expected duration over 1 year. A portion of actinic keratoses will progress to squamous cell carcinoma of the skin. It is not possible to reliably predict which spots will progress to skin cancer and so treatment is recommended to prevent development of skin cancer.  Recommend daily broad spectrum sunscreen SPF 30+ to sun-exposed areas, reapply every 2 hours as needed.  Recommend staying in the shade or wearing long sleeves, sun glasses (UVA+UVB protection) and wide brim hats (4-inch brim around the entire circumference of the hat). Call for new or changing lesions.  Destruction of lesion - R  temple x 2, scalp x 1 (3) Complexity: simple   Destruction method: cryotherapy   Informed consent: discussed and consent obtained   Timeout:  patient name, date of birth, surgical site, and procedure verified Lesion destroyed using liquid nitrogen: Yes   Region frozen until ice ball extended beyond lesion: Yes   Outcome: patient tolerated procedure well with no complications   Post-procedure details: wound care instructions given    Inflamed seborrheic keratosis (25) neck x 25  Symptomatic, irritating, patient would like treated.  Destruction of lesion - neck x 25 (25) Complexity: simple   Destruction method: cryotherapy   Informed consent: discussed and consent obtained   Timeout:  patient name, date of birth, surgical site, and procedure verified Lesion destroyed using liquid nitrogen: Yes   Region frozen until ice ball extended beyond lesion: Yes   Outcome: patient tolerated procedure well with no complications   Post-procedure details: wound care instructions given     Return in about 1 year (around 05/29/2024) for TBSE, Hx of AKs.  I, Ardis Rowan, RMA, am acting as scribe for Armida Sans, MD .   Documentation: I have reviewed the above documentation for accuracy and completeness, and I agree with the above.  Armida Sans, MD

## 2023-05-30 NOTE — Patient Instructions (Addendum)
For rash in groin Start Opzelura cream, small amount once daily until resolved  Cryotherapy Aftercare  Wash gently with soap and water everyday.   Apply Vaseline and Band-Aid daily until healed.     Due to recent changes in healthcare laws, you may see results of your pathology and/or laboratory studies on MyChart before the doctors have had a chance to review them. We understand that in some cases there may be results that are confusing or concerning to you. Please understand that not all results are received at the same time and often the doctors may need to interpret multiple results in order to provide you with the best plan of care or course of treatment. Therefore, we ask that you please give [place] 2 business days to thoroughly review all your results before contacting the office for clarification. Should we see a critical lab result, you will be contacted sooner.   If You Need Anything After Your Visit  If you have any questions or concerns for your doctor, please call our main line at [phone] and press option 4 to reach your doctor's medical assistant. If no one answers, please leave a voicemail as directed and we will return your call as soon as possible. Messages left after 4 pm will be answered the following business day.   You may also send [place] a message via MyChart. We typically respond to MyChart messages within 1-2 business days.  For prescription refills, please ask your pharmacy to contact our office. Our fax number is [phone].  If you have an urgent issue when the clinic is closed that cannot wait until the next business day, you can page your doctor at the number below.    Please note that while we do our best to be available for urgent issues outside of office hours, we are not available 24/7.   If you have an urgent issue and are unable to reach [place], you may choose to seek medical care at your doctor's office, retail clinic, urgent care center, or emergency  room.  If you have a medical emergency, please immediately call 911 or go to the emergency department.  Pager Numbers  - Dr. Gwen Pounds: [phone]  - Dr. Roseanne Reno: [phone]  - Dr. Katrinka Blazing: [phone]   In the event of inclement weather, please call our main line at [phone] for an update on the status of any delays or closures.  Dermatology Medication Tips: Please keep the boxes that topical medications come in in order to help keep track of the instructions about where and how to use these. Pharmacies typically print the medication instructions only on the boxes and not directly on the medication tubes.   If your medication is too expensive, please contact our office at [phone] option 4 or send [place] a message through MyChart.   We are unable to tell what your co-pay for medications will be in advance as this is different depending on your insurance coverage. However, we may be able to find a substitute medication at lower cost or fill out paperwork to get insurance to cover a needed medication.   If a prior authorization is required to get your medication covered by your insurance company, please allow [place] 1-2 business days to complete this process.  Drug prices often vary depending on where the prescription is filled and some pharmacies may offer cheaper prices.  The website www.goodrx.com contains coupons for medications through different pharmacies. The prices here do not account for what the cost may be  with help from insurance (it may be cheaper with your insurance), but the website can give you the price if you did not use any insurance.  - You can print the associated coupon and take it with your prescription to the pharmacy.  - You may also stop by our office during regular business hours and pick up a GoodRx coupon card.  - If you need your prescription sent electronically to a different pharmacy, notify our office through [facility] or by phone at  [phone] option 4.     Si Usted Necesita Algo Despus de Su Visita  Tambin puede enviarnos un mensaje a travs de Clinical cytogeneticist. Por lo general respondemos a los mensajes de MyChart en el transcurso de 1 a 2 das hbiles.  Para renovar recetas, por favor pida a su farmacia que se ponga en contacto con nuestra oficina. Annie Sable de fax es [place] [phone].  Si tiene un asunto urgente cuando la clnica est cerrada y que no puede esperar hasta el siguiente da hbil, puede llamar/localizar a su doctor(a) al nmero que aparece a continuacin.   Por favor, tenga en cuenta que aunque hacemos todo lo posible para estar disponibles para asuntos urgentes fuera del horario de [place], no estamos disponibles las 24 horas del da, los 7 [address]  [address] de la [place].   Si tiene un problema urgente y no puede comunicarse con nosotros, puede optar por buscar atencin mdica  en el consultorio de su doctor(a), en una clnica privada, en un centro de atencin urgente o en una sala de emergencias.  Si tiene Engineer, drilling, por favor llame inmediatamente al 911 o vaya a la sala de emergencias.  Nmeros de bper  - Dr. Gwen Pounds: [phone]  - Dra. Roseanne Reno: [phone]  - Dr. Katrinka Blazing: [phone]   En caso de inclemencias del tiempo, por favor llame a Lacy Duverney principal al [phone] para una actualizacin sobre el [place] de cualquier retraso o cierre.  Consejos para la medicacin en dermatologa: Por favor, guarde las cajas en las que vienen los medicamentos de uso tpico para ayudarle a seguir las instrucciones sobre dnde y cmo usarlos. Las farmacias generalmente imprimen las instrucciones del medicamento slo en las cajas y no directamente en los tubos del [place].   Si su medicamento es muy caro, por favor, pngase en contacto con Rolm Gala llamando al [phone] y presione la opcin 4 o envenos un mensaje a travs de Clinical cytogeneticist.   No podemos decirle cul ser su copago por los  medicamentos por adelantado ya que esto es diferente dependiendo de la cobertura de su seguro. Sin embargo, es posible que podamos encontrar un medicamento sustituto a Audiological scientist un formulario para que el seguro cubra el medicamento que se considera necesario.   Si se requiere una autorizacin previa para que su compaa de seguros [place] su medicamento, por favor permtanos de 1 a 2 das hbiles para completar [address]  Los precios de los medicamentos varan con frecuencia dependiendo del Environmental consultant de dnde se surte la receta y alguna farmacias pueden ofrecer precios ms baratos.  El sitio web www.goodrx.com tiene cupones para medicamentos de Health and safety inspector. Los precios aqu no tienen en cuenta lo que podra costar con la ayuda del seguro (puede ser ms barato con su seguro), pero el sitio web puede darle el precio si no utiliz Tourist information centre manager.  - Puede imprimir el cupn correspondiente y llevarlo con su receta a la farmacia.  - Tambin puede pasar por Rolm Gala  durante el horario de atencin regular y Education officer, museum una tarjeta de cupones de GoodRx.  - Si necesita que su receta se enve electrnicamente a una farmacia diferente, informe a nuestra oficina a travs de MyChart de [place] o por telfono llamando al [phone] y presione la opcin 4.

## 2023-06-01 ENCOUNTER — Encounter: Payer: Self-pay | Admitting: Dermatology

## 2023-06-01 DIAGNOSIS — J301 Allergic rhinitis due to pollen: Secondary | ICD-10-CM | POA: Diagnosis not present

## 2023-06-07 DIAGNOSIS — J320 Chronic maxillary sinusitis: Secondary | ICD-10-CM | POA: Diagnosis not present

## 2023-06-08 DIAGNOSIS — J301 Allergic rhinitis due to pollen: Secondary | ICD-10-CM | POA: Diagnosis not present

## 2023-06-13 DIAGNOSIS — J328 Other chronic sinusitis: Secondary | ICD-10-CM | POA: Diagnosis not present

## 2023-06-13 DIAGNOSIS — J309 Allergic rhinitis, unspecified: Secondary | ICD-10-CM | POA: Diagnosis not present

## 2023-06-14 DIAGNOSIS — M47812 Spondylosis without myelopathy or radiculopathy, cervical region: Secondary | ICD-10-CM | POA: Diagnosis not present

## 2023-06-14 DIAGNOSIS — N400 Enlarged prostate without lower urinary tract symptoms: Secondary | ICD-10-CM | POA: Diagnosis not present

## 2023-06-14 DIAGNOSIS — M542 Cervicalgia: Secondary | ICD-10-CM | POA: Diagnosis not present

## 2023-06-14 DIAGNOSIS — M5031 Other cervical disc degeneration,  high cervical region: Secondary | ICD-10-CM | POA: Diagnosis not present

## 2023-06-15 DIAGNOSIS — J301 Allergic rhinitis due to pollen: Secondary | ICD-10-CM | POA: Diagnosis not present

## 2023-06-22 DIAGNOSIS — J301 Allergic rhinitis due to pollen: Secondary | ICD-10-CM | POA: Diagnosis not present

## 2023-06-26 DIAGNOSIS — M9903 Segmental and somatic dysfunction of lumbar region: Secondary | ICD-10-CM | POA: Diagnosis not present

## 2023-06-26 DIAGNOSIS — M9901 Segmental and somatic dysfunction of cervical region: Secondary | ICD-10-CM | POA: Diagnosis not present

## 2023-06-26 DIAGNOSIS — R519 Headache, unspecified: Secondary | ICD-10-CM | POA: Diagnosis not present

## 2023-06-26 DIAGNOSIS — M542 Cervicalgia: Secondary | ICD-10-CM | POA: Diagnosis not present

## 2023-06-29 DIAGNOSIS — J301 Allergic rhinitis due to pollen: Secondary | ICD-10-CM | POA: Diagnosis not present

## 2023-07-06 DIAGNOSIS — J301 Allergic rhinitis due to pollen: Secondary | ICD-10-CM | POA: Diagnosis not present

## 2023-07-10 ENCOUNTER — Ambulatory Visit: Payer: PPO | Admitting: Urology

## 2023-07-10 ENCOUNTER — Encounter: Payer: Self-pay | Admitting: Urology

## 2023-07-10 ENCOUNTER — Other Ambulatory Visit: Payer: Self-pay

## 2023-07-10 ENCOUNTER — Other Ambulatory Visit
Admission: RE | Admit: 2023-07-10 | Discharge: 2023-07-10 | Disposition: A | Discharge status: Home / Self Care | Payer: PPO | Attending: Urology | Admitting: Urology

## 2023-07-10 VITALS — BP 108/66 | HR 66 | Ht 66.0 in | Wt 164.0 lb

## 2023-07-10 DIAGNOSIS — Z87442 Personal history of urinary calculi: Secondary | ICD-10-CM

## 2023-07-10 DIAGNOSIS — Z7689 Persons encountering health services in other specified circumstances: Secondary | ICD-10-CM

## 2023-07-10 DIAGNOSIS — R3914 Feeling of incomplete bladder emptying: Secondary | ICD-10-CM | POA: Diagnosis not present

## 2023-07-10 DIAGNOSIS — R339 Retention of urine, unspecified: Secondary | ICD-10-CM

## 2023-07-10 DIAGNOSIS — M9901 Segmental and somatic dysfunction of cervical region: Secondary | ICD-10-CM | POA: Diagnosis not present

## 2023-07-10 DIAGNOSIS — N401 Enlarged prostate with lower urinary tract symptoms: Secondary | ICD-10-CM | POA: Insufficient documentation

## 2023-07-10 DIAGNOSIS — N200 Calculus of kidney: Secondary | ICD-10-CM

## 2023-07-10 DIAGNOSIS — R519 Headache, unspecified: Secondary | ICD-10-CM | POA: Diagnosis not present

## 2023-07-10 DIAGNOSIS — M542 Cervicalgia: Secondary | ICD-10-CM | POA: Diagnosis not present

## 2023-07-10 DIAGNOSIS — Z125 Encounter for screening for malignant neoplasm of prostate: Secondary | ICD-10-CM

## 2023-07-10 DIAGNOSIS — M9903 Segmental and somatic dysfunction of lumbar region: Secondary | ICD-10-CM | POA: Diagnosis not present

## 2023-07-10 LAB — URINALYSIS, COMPLETE (UACMP) WITH MICROSCOPIC
Bilirubin Urine: NEGATIVE
Glucose, UA: NEGATIVE mg/dL
Ketones, ur: NEGATIVE mg/dL
Leukocytes,Ua: NEGATIVE
Nitrite: NEGATIVE
Protein, ur: NEGATIVE mg/dL
Specific Gravity, Urine: 1.025 (ref 1.005–1.030)
Squamous Epithelial / HPF: NONE SEEN /[HPF] (ref 0–5)
WBC, UA: NONE SEEN WBC/hpf (ref 0–5)
pH: 6.5 (ref 5.0–8.0)

## 2023-07-10 MED ORDER — SILODOSIN 8 MG PO CAPS: 8.0000 mg | ORAL_CAPSULE | Freq: Every day | ORAL | 3 refills | Status: DC

## 2023-07-10 MED ORDER — DUTASTERIDE 0.5 MG PO CAPS: 0.5000 mg | ORAL_CAPSULE | Freq: Every day | ORAL | 3 refills | Status: DC

## 2023-07-10 NOTE — Progress Notes (Signed)
07/10/23 3:13 PM   Jimmy Obrien, Jimmy Obrien [id number]  CC: BPH/LUTS, PSA screening, history of nephrolithiasis, left renal hernia containing bladder  HPI: I saw Jimmy Obrien today for the above issues, he was previously followed by Dr. Sheppard Penton and transferring his care to [facility] urology.  He was seen by [place] in 2020 when he had shockwave lithotripsy for ureteral stent.  He has been managed long-term by Dr. Sheppard Penton and has a distant history of a TURP, currently on maximal medical therapy with silodosin and dutasteride.  Urinalysis today is pending, PVR is normal at 1ml.  He had a CT in February 2024 that showed a left inguinal hernia containing part of the bladder, no hydronephrosis or stones.  His primary urinary complaint is some urgency and frequency.  He does do some tea during the day.  He denies any dysuria, infections, gross hematuria.  PSA has been normal, most normally 0.05, corrected for dutasteride 0.1, within the normal range.  He is not interested in addressing the inguinal hernia at this time but may be in the future.   PMH: Past Medical History:  Diagnosis Date   Actinic keratosis    Asthma    exercise induced   BPH (benign prostatic hyperplasia)    Diverticulitis    Diverticulosis    Fatty liver    GERD (gastroesophageal reflux disease)    History of kidney stones    Hx of atrial fibrillation without current medication    Hypertension    Hyperthyroidism    Multinodular Goiter   Sleep apnea    No CPAP   Thrombocytopenia (HCC)     Surgical History: Past Surgical History:  Procedure Laterality Date   BIOPSY  05/23/2023   Procedure: BIOPSY;  Surgeon: Norma Fredrickson, Boykin Nearing, MD;  Location: [facility] ENDOSCOPY;  Service: Gastroenterology;;   CATARACT EXTRACTION W/PHACO Right 07/13/2021   Procedure: CATARACT EXTRACTION PHACO AND INTRAOCULAR LENS PLACEMENT (IOC) RIGHT 2.92 00:50.2;  Surgeon: Lockie Mola, MD;  Location: [facility]ERY [facility];  Service: Ophthalmology;  Laterality:  Right;   CATARACT EXTRACTION W/PHACO Left 07/27/2021   Procedure: CATARACT EXTRACTION PHACO AND INTRAOCULAR LENS PLACEMENT (IOC) LEFT 3.67 00:39.7;  Surgeon: Lockie Mola, MD;  Location: [facility]ERY [facility];  Service: Ophthalmology;  Laterality: Left;   CHOLECYSTECTOMY     COLON RESECTION     COLONOSCOPY WITH PROPOFOL N/A 03/31/2019   Procedure: COLONOSCOPY WITH PROPOFOL;  Surgeon: Toledo, Boykin Nearing, MD;  Location: [facility] ENDOSCOPY;  Service: Endoscopy;  Laterality: N/A;   COLONOSCOPY WITH PROPOFOL N/A 05/23/2023   Procedure: COLONOSCOPY WITH PROPOFOL;  Surgeon: Toledo, Boykin Nearing, MD;  Location: [facility] ENDOSCOPY;  Service: Gastroenterology;  Laterality: N/A;   ESOPHAGOGASTRODUODENOSCOPY N/A 05/23/2023   Procedure: ESOPHAGOGASTRODUODENOSCOPY (EGD);  Surgeon: Toledo, Boykin Nearing, MD;  Location: [facility] ENDOSCOPY;  Service: Gastroenterology;  Laterality: N/A;   ESOPHAGOGASTRODUODENOSCOPY (EGD) WITH PROPOFOL N/A 08/16/2016   Procedure: ESOPHAGOGASTRODUODENOSCOPY (EGD) WITH PROPOFOL;  Surgeon: Scot Jun, MD;  Location: [facility] ENDOSCOPY;  Service: Endoscopy;  Laterality: N/A;   ESOPHAGOGASTRODUODENOSCOPY (EGD) WITH PROPOFOL N/A 03/31/2019   Procedure: ESOPHAGOGASTRODUODENOSCOPY (EGD) WITH PROPOFOL;  Surgeon: Toledo, Boykin Nearing, MD;  Location: [facility] ENDOSCOPY;  Service: Endoscopy;  Laterality: N/A;   EXTRACORPOREAL SHOCK WAVE LITHOTRIPSY Left 05/15/2019   Procedure: EXTRACORPOREAL SHOCK WAVE LITHOTRIPSY (ESWL);  Surgeon: Sondra Come, MD;  Location: [facility] ORS;  Service: Urology;  Laterality: Left;   POLYPECTOMY  05/23/2023   Procedure: POLYPECTOMY;  Surgeon: Toledo, Boykin Nearing, MD;  Location: [facility] ENDOSCOPY;  Service: Gastroenterology;;  TRANSURETHRAL RESECTION OF PROSTATE      Social History:  reports that he has never smoked. He has never used smokeless tobacco. He reports current alcohol use. He reports that he does not use drugs.  Physical Exam: BP 108/66 (BP Location: Left Arm, Patient Position:  Sitting, Cuff Size: Normal)   Pulse 66   Ht 5\' 6"  (1.676 m)   Wt 164 lb (74.4 kg)   BMI 26.47 kg/m    Constitutional:  Alert and oriented, No acute distress. Cardiovascular: No clubbing, cyanosis, or edema. Respiratory: Normal respiratory effort, no increased work of breathing. GI: Abdomen is soft, nontender, nondistended, no abdominal masses   Laboratory Data: Reviewed, see HPI  Pertinent Imaging: I have personally viewed and interpreted the CT scan from February 2024 showing no hydronephrosis or stones, left inguinal hernia containing portion of the bladder.  Assessment & Plan:   77 year old male previously followed by Dr. 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  Urinary symptoms currently well-controlled on maximal medical therapy, emptying well.  He is not is and having the hernia addressed at this time, but we discussed this may improve his urinary symptoms somewhat.  Reassurance provided regarding normal PSA, can discontinue screening per guideline recommendations.  I reviewed the outside notes from Dr. Sheppard Penton.  Silodosin and dutasteride refilled Okay to place referral to general surgery for left inguinal hernia containing portion of the bladder if patient desires in the future RTC 1 year PVR symptom check  Legrand Rams, MD 07/10/2023  [facility] [address] [place], [place] [address] [phone]

## 2023-07-17 DIAGNOSIS — J328 Other chronic sinusitis: Secondary | ICD-10-CM | POA: Diagnosis not present

## 2023-07-17 DIAGNOSIS — J301 Allergic rhinitis due to pollen: Secondary | ICD-10-CM | POA: Diagnosis not present

## 2023-07-20 DIAGNOSIS — J301 Allergic rhinitis due to pollen: Secondary | ICD-10-CM | POA: Diagnosis not present

## 2023-07-23 DIAGNOSIS — J301 Allergic rhinitis due to pollen: Secondary | ICD-10-CM | POA: Diagnosis not present

## 2023-07-24 DIAGNOSIS — M9901 Segmental and somatic dysfunction of cervical region: Secondary | ICD-10-CM | POA: Diagnosis not present

## 2023-07-24 DIAGNOSIS — R519 Headache, unspecified: Secondary | ICD-10-CM | POA: Diagnosis not present

## 2023-07-24 DIAGNOSIS — M542 Cervicalgia: Secondary | ICD-10-CM | POA: Diagnosis not present

## 2023-07-24 DIAGNOSIS — M9903 Segmental and somatic dysfunction of lumbar region: Secondary | ICD-10-CM | POA: Diagnosis not present

## 2023-07-27 DIAGNOSIS — J301 Allergic rhinitis due to pollen: Secondary | ICD-10-CM | POA: Diagnosis not present

## 2023-07-30 DIAGNOSIS — M542 Cervicalgia: Secondary | ICD-10-CM | POA: Diagnosis not present

## 2023-07-30 DIAGNOSIS — R519 Headache, unspecified: Secondary | ICD-10-CM | POA: Diagnosis not present

## 2023-07-30 DIAGNOSIS — M9901 Segmental and somatic dysfunction of cervical region: Secondary | ICD-10-CM | POA: Diagnosis not present

## 2023-07-30 DIAGNOSIS — M9903 Segmental and somatic dysfunction of lumbar region: Secondary | ICD-10-CM | POA: Diagnosis not present

## 2023-08-01 DIAGNOSIS — M9903 Segmental and somatic dysfunction of lumbar region: Secondary | ICD-10-CM | POA: Diagnosis not present

## 2023-08-01 DIAGNOSIS — M9901 Segmental and somatic dysfunction of cervical region: Secondary | ICD-10-CM | POA: Diagnosis not present

## 2023-08-01 DIAGNOSIS — M542 Cervicalgia: Secondary | ICD-10-CM | POA: Diagnosis not present

## 2023-08-01 DIAGNOSIS — R519 Headache, unspecified: Secondary | ICD-10-CM | POA: Diagnosis not present

## 2023-08-03 DIAGNOSIS — J301 Allergic rhinitis due to pollen: Secondary | ICD-10-CM | POA: Diagnosis not present

## 2023-08-07 DIAGNOSIS — R519 Headache, unspecified: Secondary | ICD-10-CM | POA: Diagnosis not present

## 2023-08-07 DIAGNOSIS — M9903 Segmental and somatic dysfunction of lumbar region: Secondary | ICD-10-CM | POA: Diagnosis not present

## 2023-08-07 DIAGNOSIS — M9901 Segmental and somatic dysfunction of cervical region: Secondary | ICD-10-CM | POA: Diagnosis not present

## 2023-08-07 DIAGNOSIS — M542 Cervicalgia: Secondary | ICD-10-CM | POA: Diagnosis not present

## 2023-08-16 DIAGNOSIS — J4520 Mild intermittent asthma, uncomplicated: Secondary | ICD-10-CM | POA: Diagnosis not present

## 2023-08-16 DIAGNOSIS — G4733 Obstructive sleep apnea (adult) (pediatric): Secondary | ICD-10-CM | POA: Diagnosis not present

## 2023-08-16 DIAGNOSIS — K219 Gastro-esophageal reflux disease without esophagitis: Secondary | ICD-10-CM | POA: Diagnosis not present

## 2023-08-16 DIAGNOSIS — G4710 Hypersomnia, unspecified: Secondary | ICD-10-CM | POA: Diagnosis not present

## 2023-08-17 DIAGNOSIS — J301 Allergic rhinitis due to pollen: Secondary | ICD-10-CM | POA: Diagnosis not present

## 2023-08-21 DIAGNOSIS — M9901 Segmental and somatic dysfunction of cervical region: Secondary | ICD-10-CM | POA: Diagnosis not present

## 2023-08-21 DIAGNOSIS — M9903 Segmental and somatic dysfunction of lumbar region: Secondary | ICD-10-CM | POA: Diagnosis not present

## 2023-08-21 DIAGNOSIS — M542 Cervicalgia: Secondary | ICD-10-CM | POA: Diagnosis not present

## 2023-08-21 DIAGNOSIS — R519 Headache, unspecified: Secondary | ICD-10-CM | POA: Diagnosis not present

## 2023-08-23 DIAGNOSIS — R1030 Lower abdominal pain, unspecified: Secondary | ICD-10-CM | POA: Diagnosis not present

## 2023-08-23 DIAGNOSIS — R1032 Left lower quadrant pain: Secondary | ICD-10-CM | POA: Diagnosis not present

## 2023-08-23 DIAGNOSIS — R110 Nausea: Secondary | ICD-10-CM | POA: Diagnosis not present

## 2023-08-23 DIAGNOSIS — Z889 Allergy status to unspecified drugs, medicaments and biological substances status: Secondary | ICD-10-CM | POA: Diagnosis not present

## 2023-08-24 DIAGNOSIS — J301 Allergic rhinitis due to pollen: Secondary | ICD-10-CM | POA: Diagnosis not present

## 2023-08-31 DIAGNOSIS — J301 Allergic rhinitis due to pollen: Secondary | ICD-10-CM | POA: Diagnosis not present

## 2023-09-11 DIAGNOSIS — M542 Cervicalgia: Secondary | ICD-10-CM | POA: Diagnosis not present

## 2023-09-11 DIAGNOSIS — M9901 Segmental and somatic dysfunction of cervical region: Secondary | ICD-10-CM | POA: Diagnosis not present

## 2023-09-11 DIAGNOSIS — M9903 Segmental and somatic dysfunction of lumbar region: Secondary | ICD-10-CM | POA: Diagnosis not present

## 2023-09-11 DIAGNOSIS — R519 Headache, unspecified: Secondary | ICD-10-CM | POA: Diagnosis not present

## 2023-09-13 DIAGNOSIS — I10 Essential (primary) hypertension: Secondary | ICD-10-CM | POA: Diagnosis not present

## 2023-09-13 DIAGNOSIS — R300 Dysuria: Secondary | ICD-10-CM | POA: Diagnosis not present

## 2023-09-13 DIAGNOSIS — R7309 Other abnormal glucose: Secondary | ICD-10-CM | POA: Diagnosis not present

## 2023-09-14 DIAGNOSIS — J301 Allergic rhinitis due to pollen: Secondary | ICD-10-CM | POA: Diagnosis not present

## 2023-09-19 DIAGNOSIS — D696 Thrombocytopenia, unspecified: Secondary | ICD-10-CM | POA: Diagnosis not present

## 2023-09-19 DIAGNOSIS — I480 Paroxysmal atrial fibrillation: Secondary | ICD-10-CM | POA: Diagnosis not present

## 2023-09-19 DIAGNOSIS — J4530 Mild persistent asthma, uncomplicated: Secondary | ICD-10-CM | POA: Diagnosis not present

## 2023-09-19 DIAGNOSIS — Z0001 Encounter for general adult medical examination with abnormal findings: Secondary | ICD-10-CM | POA: Diagnosis not present

## 2023-09-19 DIAGNOSIS — K219 Gastro-esophageal reflux disease without esophagitis: Secondary | ICD-10-CM | POA: Diagnosis not present

## 2023-09-19 DIAGNOSIS — M79641 Pain in right hand: Secondary | ICD-10-CM | POA: Diagnosis not present

## 2023-09-19 DIAGNOSIS — I10 Essential (primary) hypertension: Secondary | ICD-10-CM | POA: Diagnosis not present

## 2023-09-19 DIAGNOSIS — Z0000 Encounter for general adult medical examination without abnormal findings: Secondary | ICD-10-CM | POA: Diagnosis not present

## 2023-09-19 DIAGNOSIS — R7303 Prediabetes: Secondary | ICD-10-CM | POA: Diagnosis not present

## 2023-09-19 DIAGNOSIS — K4090 Unilateral inguinal hernia, without obstruction or gangrene, not specified as recurrent: Secondary | ICD-10-CM | POA: Diagnosis not present

## 2023-09-19 DIAGNOSIS — G4733 Obstructive sleep apnea (adult) (pediatric): Secondary | ICD-10-CM | POA: Diagnosis not present

## 2023-09-25 DIAGNOSIS — K4090 Unilateral inguinal hernia, without obstruction or gangrene, not specified as recurrent: Secondary | ICD-10-CM | POA: Diagnosis not present

## 2023-09-28 DIAGNOSIS — J301 Allergic rhinitis due to pollen: Secondary | ICD-10-CM | POA: Diagnosis not present

## 2023-10-02 DIAGNOSIS — M25541 Pain in joints of right hand: Secondary | ICD-10-CM | POA: Diagnosis not present

## 2023-10-02 DIAGNOSIS — M65321 Trigger finger, right index finger: Secondary | ICD-10-CM | POA: Diagnosis not present

## 2023-10-05 ENCOUNTER — Emergency Department
Admission: EM | Admit: 2023-10-05 | Discharge: 2023-10-05 | Disposition: A | Discharge status: Home / Self Care | Payer: PPO | Attending: Emergency Medicine | Admitting: Emergency Medicine

## 2023-10-05 ENCOUNTER — Other Ambulatory Visit: Payer: Self-pay

## 2023-10-05 DIAGNOSIS — S8011XA Contusion of right lower leg, initial encounter: Secondary | ICD-10-CM | POA: Diagnosis not present

## 2023-10-05 DIAGNOSIS — J301 Allergic rhinitis due to pollen: Secondary | ICD-10-CM | POA: Diagnosis not present

## 2023-10-05 DIAGNOSIS — R600 Localized edema: Secondary | ICD-10-CM | POA: Insufficient documentation

## 2023-10-05 DIAGNOSIS — T148XXA Other injury of unspecified body region, initial encounter: Secondary | ICD-10-CM

## 2023-10-05 MED ORDER — CEPHALEXIN 500 MG PO CAPS: 500.0000 mg | ORAL_CAPSULE | Freq: Three times a day (TID) | ORAL | 0 refills | Status: AC

## 2023-10-05 NOTE — ED Provider Notes (Signed)
   [facility] Provider Note    Event Date/Time   First MD Initiated Contact with Patient 10/05/23 2212     (approximate)   History   Leg Pain   HPI  Jimmy Obrien is a 78 y.o. male who presents with complaints of right leg redness.  Patient reports he was getting a pedicure today, at the beginning of the pedicure technician noted that he had a bruise just below his knee on the right, had massage of the leg associate with pedicure, later noticed redness along the anterior lower leg, no pain, no burning     Physical Exam   Triage Vital Signs: ED Triage Vitals [10/05/23 2125]  Encounter Vitals Group     BP (!) 137/110     Systolic BP Percentile      Diastolic BP Percentile      Pulse Rate 72     Resp 16     Temp 98.1 F (36.7 C)     Temp Source Oral     SpO2 98 %     Weight 74.4 kg (164 lb)     Height 1.676 m (5\' 6" )     Head Circumference      Peak Flow      Pain Score 0     Pain Loc      Pain Education      Exclude from Growth Chart     Most recent vital signs: Vitals:   10/05/23 2125 10/05/23 2233  BP: (!) 137/110 (!) 138/94  Pulse: 72 78  Resp: 16 14  Temp: 98.1 F (36.7 C) 98.2 F (36.8 C)  SpO2: 98% 99%     General: Awake, no distress.  CV:  Good peripheral perfusion.  Resp:  Normal effort.  Abd:  No distention.  Other:  Mild erythema anterior lower leg, no tenderness, doubt cellulitis, warm and well-perfused distally   ED Results / Procedures / Treatments   Labs (all labs ordered are listed, but only abnormal results are displayed) Labs Reviewed - No data to display   EKG     RADIOLOGY     PROCEDURES:  Critical Care performed:   Procedures   MEDICATIONS ORDERED IN ED: Medications - No data to display   IMPRESSION / MDM / ASSESSMENT AND PLAN / ED COURSE  I reviewed the triage vital signs and the nursing notes. Patient's presentation is most consistent with acute, uncomplicated illness.  I suspect  discoloration related to blood pushing down the anterior lower leg from bruise because of massage.  Will prescribe Keflex in case any burning or worsening, discussed this with patient he agrees with plan        FINAL CLINICAL IMPRESSION(S) / ED DIAGNOSES   Final diagnoses:  Bruising     Rx / DC Orders   ED Discharge Orders          Ordered    cephALEXin (KEFLEX) 500 MG capsule  3 times daily        10/05/23 2219             Note:  This document was prepared using Dragon voice recognition software and may include unintentional dictation errors.   Jene Every, MD 10/05/23 2259

## 2023-10-05 NOTE — ED Triage Notes (Signed)
Pt states that he noticed a bruise on his right lower leg today at approx noon and then noticed bright red line from right shin to right knee later. Pt denies specific injury. Denies specific pain and not warm to touch. No swelling noted

## 2023-10-08 DIAGNOSIS — J301 Allergic rhinitis due to pollen: Secondary | ICD-10-CM | POA: Diagnosis not present

## 2023-10-10 DIAGNOSIS — M542 Cervicalgia: Secondary | ICD-10-CM | POA: Diagnosis not present

## 2023-10-10 DIAGNOSIS — M9901 Segmental and somatic dysfunction of cervical region: Secondary | ICD-10-CM | POA: Diagnosis not present

## 2023-10-10 DIAGNOSIS — M9903 Segmental and somatic dysfunction of lumbar region: Secondary | ICD-10-CM | POA: Diagnosis not present

## 2023-10-10 DIAGNOSIS — R519 Headache, unspecified: Secondary | ICD-10-CM | POA: Diagnosis not present

## 2023-10-12 DIAGNOSIS — J301 Allergic rhinitis due to pollen: Secondary | ICD-10-CM | POA: Diagnosis not present

## 2023-10-19 DIAGNOSIS — J301 Allergic rhinitis due to pollen: Secondary | ICD-10-CM | POA: Diagnosis not present

## 2023-10-26 DIAGNOSIS — J301 Allergic rhinitis due to pollen: Secondary | ICD-10-CM | POA: Diagnosis not present

## 2023-10-29 DIAGNOSIS — Z8719 Personal history of other diseases of the digestive system: Secondary | ICD-10-CM | POA: Diagnosis not present

## 2023-10-29 DIAGNOSIS — K5909 Other constipation: Secondary | ICD-10-CM | POA: Diagnosis not present

## 2023-10-29 DIAGNOSIS — K219 Gastro-esophageal reflux disease without esophagitis: Secondary | ICD-10-CM | POA: Diagnosis not present

## 2023-10-30 DIAGNOSIS — E0520 Thyrotoxicosis with toxic multinodular goiter without thyrotoxic crisis or storm: Secondary | ICD-10-CM | POA: Diagnosis not present

## 2023-11-02 DIAGNOSIS — J301 Allergic rhinitis due to pollen: Secondary | ICD-10-CM | POA: Diagnosis not present

## 2023-11-06 DIAGNOSIS — M9901 Segmental and somatic dysfunction of cervical region: Secondary | ICD-10-CM | POA: Diagnosis not present

## 2023-11-06 DIAGNOSIS — R519 Headache, unspecified: Secondary | ICD-10-CM | POA: Diagnosis not present

## 2023-11-06 DIAGNOSIS — M542 Cervicalgia: Secondary | ICD-10-CM | POA: Diagnosis not present

## 2023-11-06 DIAGNOSIS — M9903 Segmental and somatic dysfunction of lumbar region: Secondary | ICD-10-CM | POA: Diagnosis not present

## 2023-11-06 DIAGNOSIS — E0520 Thyrotoxicosis with toxic multinodular goiter without thyrotoxic crisis or storm: Secondary | ICD-10-CM | POA: Diagnosis not present

## 2023-11-09 DIAGNOSIS — J301 Allergic rhinitis due to pollen: Secondary | ICD-10-CM | POA: Diagnosis not present

## 2023-11-16 DIAGNOSIS — J301 Allergic rhinitis due to pollen: Secondary | ICD-10-CM | POA: Diagnosis not present

## 2023-11-23 DIAGNOSIS — J301 Allergic rhinitis due to pollen: Secondary | ICD-10-CM | POA: Diagnosis not present

## 2023-11-30 DIAGNOSIS — J301 Allergic rhinitis due to pollen: Secondary | ICD-10-CM | POA: Diagnosis not present

## 2023-12-04 DIAGNOSIS — M9903 Segmental and somatic dysfunction of lumbar region: Secondary | ICD-10-CM | POA: Diagnosis not present

## 2023-12-04 DIAGNOSIS — M542 Cervicalgia: Secondary | ICD-10-CM | POA: Diagnosis not present

## 2023-12-04 DIAGNOSIS — R519 Headache, unspecified: Secondary | ICD-10-CM | POA: Diagnosis not present

## 2023-12-04 DIAGNOSIS — M9901 Segmental and somatic dysfunction of cervical region: Secondary | ICD-10-CM | POA: Diagnosis not present

## 2023-12-14 DIAGNOSIS — J301 Allergic rhinitis due to pollen: Secondary | ICD-10-CM | POA: Diagnosis not present

## 2023-12-21 DIAGNOSIS — J301 Allergic rhinitis due to pollen: Secondary | ICD-10-CM | POA: Diagnosis not present

## 2023-12-26 DIAGNOSIS — N410 Acute prostatitis: Secondary | ICD-10-CM | POA: Diagnosis not present

## 2023-12-26 DIAGNOSIS — R350 Frequency of micturition: Secondary | ICD-10-CM | POA: Diagnosis not present

## 2023-12-31 DIAGNOSIS — J301 Allergic rhinitis due to pollen: Secondary | ICD-10-CM | POA: Diagnosis not present

## 2024-01-01 DIAGNOSIS — R519 Headache, unspecified: Secondary | ICD-10-CM | POA: Diagnosis not present

## 2024-01-01 DIAGNOSIS — M9901 Segmental and somatic dysfunction of cervical region: Secondary | ICD-10-CM | POA: Diagnosis not present

## 2024-01-01 DIAGNOSIS — M542 Cervicalgia: Secondary | ICD-10-CM | POA: Diagnosis not present

## 2024-01-01 DIAGNOSIS — M9903 Segmental and somatic dysfunction of lumbar region: Secondary | ICD-10-CM | POA: Diagnosis not present

## 2024-01-04 DIAGNOSIS — J301 Allergic rhinitis due to pollen: Secondary | ICD-10-CM | POA: Diagnosis not present

## 2024-01-11 DIAGNOSIS — J301 Allergic rhinitis due to pollen: Secondary | ICD-10-CM | POA: Diagnosis not present

## 2024-01-18 DIAGNOSIS — J301 Allergic rhinitis due to pollen: Secondary | ICD-10-CM | POA: Diagnosis not present

## 2024-01-23 NOTE — Telephone Encounter (Signed)
 Could not leave a MyChart message but I called both numbers and left a voicemail letting him know that Doctor Sterrett was on call this Friday and we moved his appointment to 7:30 at the [facility] location. I told him that if he could not do this to give us  a call and we will reschedule him.

## 2024-01-25 ENCOUNTER — Encounter: Payer: Medicare (Managed Care) | Attending: Urology | Primary: Internal Medicine

## 2024-01-25 DIAGNOSIS — J301 Allergic rhinitis due to pollen: Secondary | ICD-10-CM | POA: Diagnosis not present

## 2024-01-29 DIAGNOSIS — R519 Headache, unspecified: Secondary | ICD-10-CM | POA: Diagnosis not present

## 2024-01-29 DIAGNOSIS — M9901 Segmental and somatic dysfunction of cervical region: Secondary | ICD-10-CM | POA: Diagnosis not present

## 2024-01-29 DIAGNOSIS — M9903 Segmental and somatic dysfunction of lumbar region: Secondary | ICD-10-CM | POA: Diagnosis not present

## 2024-01-29 DIAGNOSIS — M542 Cervicalgia: Secondary | ICD-10-CM | POA: Diagnosis not present

## 2024-02-01 DIAGNOSIS — K5792 Diverticulitis of intestine, part unspecified, without perforation or abscess without bleeding: Secondary | ICD-10-CM | POA: Diagnosis not present

## 2024-02-01 DIAGNOSIS — J301 Allergic rhinitis due to pollen: Secondary | ICD-10-CM | POA: Diagnosis not present

## 2024-02-01 DIAGNOSIS — R140 Abdominal distension (gaseous): Secondary | ICD-10-CM | POA: Diagnosis not present

## 2024-02-08 DIAGNOSIS — J301 Allergic rhinitis due to pollen: Secondary | ICD-10-CM | POA: Diagnosis not present

## 2024-02-11 NOTE — Progress Notes (Unsigned)
 02/13/2024 9:48 PM   [name] 11/27/1945 [id number]  Referring provider: Little Riff, MD [address] [facility] [place],  [place] [address]  Urological history: 1. BPH with LU TS - PSA (09/2023) 0.04 - remote history of TURP - silodosin  8 mg daily and dutasteride  0.5 mg daily  2. Nephrolithiasis - ESWL (2020)  No chief complaint on file.  HPI: Jimmy Obrien is a 78 y.o. man who presents today for incomplete bladder emptying, frequency and pressure in the prostate.    Previous records reviewed.   UA ***  PVR ***  PMH: Past Medical History:  Diagnosis Date   Actinic keratosis    Asthma    exercise induced   BPH (benign prostatic hyperplasia)    Diverticulitis    Diverticulosis    Fatty liver    GERD (gastroesophageal reflux disease)    History of kidney stones    Hx of atrial fibrillation without current medication    Hypertension    Hyperthyroidism    Multinodular Goiter   Sleep apnea    No CPAP   Thrombocytopenia (HCC)     Surgical History: Past Surgical History:  Procedure Laterality Date   BIOPSY  05/23/2023   Procedure: BIOPSY;  Surgeon: Corky Diener, Alphonsus Jeans, MD;  Location: [facility] ENDOSCOPY;  Service: Gastroenterology;;   CATARACT EXTRACTION W/PHACO Right 07/13/2021   Procedure: CATARACT EXTRACTION PHACO AND INTRAOCULAR LENS PLACEMENT (IOC) RIGHT 2.92 00:50.2;  Surgeon: Annell Kidney, MD;  Location: [facility];  Service: Ophthalmology;  Laterality: Right;   CATARACT EXTRACTION W/PHACO Left 07/27/2021   Procedure: CATARACT EXTRACTION PHACO AND INTRAOCULAR LENS PLACEMENT (IOC) LEFT 3.67 00:39.7;  Surgeon: Annell Kidney, MD;  Location: [facility];  Service: Ophthalmology;  Laterality: Left;   CHOLECYSTECTOMY     COLON RESECTION     COLONOSCOPY WITH PROPOFOL  N/A 03/31/2019   Procedure: COLONOSCOPY WITH PROPOFOL ;  Surgeon: Toledo, Alphonsus Jeans, MD;  Location: [facility] ENDOSCOPY;  Service: Endoscopy;  Laterality:  N/A;   COLONOSCOPY WITH PROPOFOL  N/A 05/23/2023   Procedure: COLONOSCOPY WITH PROPOFOL ;  Surgeon: Toledo, Alphonsus Jeans, MD;  Location: [facility] ENDOSCOPY;  Service: Gastroenterology;  Laterality: N/A;   ESOPHAGOGASTRODUODENOSCOPY N/A 05/23/2023   Procedure: ESOPHAGOGASTRODUODENOSCOPY (EGD);  Surgeon: Toledo, Alphonsus Jeans, MD;  Location: [facility] ENDOSCOPY;  Service: Gastroenterology;  Laterality: N/A;   ESOPHAGOGASTRODUODENOSCOPY (EGD) WITH PROPOFOL  N/A 08/16/2016   Procedure: ESOPHAGOGASTRODUODENOSCOPY (EGD) WITH PROPOFOL ;  Surgeon: Cassie Click, MD;  Location: [facility] ENDOSCOPY;  Service: Endoscopy;  Laterality: N/A;   ESOPHAGOGASTRODUODENOSCOPY (EGD) WITH PROPOFOL  N/A 03/31/2019   Procedure: ESOPHAGOGASTRODUODENOSCOPY (EGD) WITH PROPOFOL ;  Surgeon: Toledo, Alphonsus Jeans, MD;  Location: [facility] ENDOSCOPY;  Service: Endoscopy;  Laterality: N/A;   EXTRACORPOREAL SHOCK WAVE LITHOTRIPSY Left 05/15/2019   Procedure: EXTRACORPOREAL SHOCK WAVE LITHOTRIPSY (ESWL);  Surgeon: Lawerence Pressman, MD;  Location: [facility] ORS;  Service: Urology;  Laterality: Left;   POLYPECTOMY  05/23/2023   Procedure: POLYPECTOMY;  Surgeon: Toledo, Teodoro K, MD;  Location: [facility] ENDOSCOPY;  Service: Gastroenterology;;   TRANSURETHRAL RESECTION OF PROSTATE      Home Medications:  Allergies as of 02/13/2024       Reactions   Levofloxacin    Other Reaction(s): Other (See Comments) Increased neuropathy and tightness   Tetracycline Hcl    Other Reaction(s): inflamed genitals   Ciprofloxacin Other (See Comments), Rash   Joint aches Other Reaction(s): Abdominal Pain, Other (See Comments)   Other Rash   Other reaction(s): Unknown Inflammation   Penicillin G Sodium Rash  Penicillins Rash   Sulfa Antibiotics Rash   Sulfacetamide Sodium Rash   Tetracyclines & Related Other (See Comments)   Inflammation        Medication List        Accurate as of February 11, 2024  9:48 PM. If you have any questions, ask your nurse or doctor.           acetaminophen  650 MG CR tablet Commonly known as: TYLENOL  Take 1,300 mg by mouth 2 (two) times daily.   albuterol  108 (90 Base) MCG/ACT inhaler Commonly known as: VENTOLIN  HFA Inhale 2 puffs into the lungs every 6 (six) hours as needed for wheezing or shortness of breath.   amphetamine-dextroamphetamine 10 MG tablet Commonly known as: ADDERALL Take 10 mg by mouth daily with breakfast.   Arnuity Ellipta 200 MCG/ACT Aepb Generic drug: Fluticasone Furoate Inhale 1 puff into the lungs daily.   aspirin EC 81 MG tablet Take 81 mg by mouth daily.   atenolol 25 MG tablet Commonly known as: TENORMIN Take 12.5 mg by mouth daily.   azelastine 0.1 % nasal spray Commonly known as: ASTELIN Place 1 spray into both nostrils 2 (two) times daily. Use in each nostril as directed   budesonide 32 MCG/ACT nasal spray Commonly known as: RHINOCORT AQUA Place 1 spray into both nostrils daily.   Cholecalciferol 250 MCG (10000 UT) Caps Take 2,000 Units by mouth 1 day or 1 dose.   cyanocobalamin 1000 MCG tablet Take 1,000 mcg by mouth in the morning and at bedtime.   Dapsone  7.5 % Gel Commonly known as: Aczone  Apply to face twice a day   diazepam  5 MG tablet Commonly known as: VALIUM  Take 5 mg by mouth 2 (two) times daily as needed.   dutasteride  0.5 MG capsule Commonly known as: AVODART  Take 1 capsule (0.5 mg total) by mouth daily.   EPINEPHrine  0.3 mg/0.3 mL Soaj injection Commonly known as: EPI-PEN Inject 0.3 mg into the muscle as needed.   FLEXALL ULTRA PLUS EX Apply 1 application topically as needed.   Flovent HFA 110 MCG/ACT inhaler Generic drug: fluticasone Inhale 2 puffs into the lungs.   gentamicin ointment 0.1 % Commonly known as: GARAMYCIN Apply 1 application topically 2 (two) times daily. In nose   halobetasol  0.05 % cream Commonly known as: ULTRAVATE  Apply topically as directed for 14 days, ONCE A DAY up to 5 days a week to AFFECTED AREAS OF psoriasis on hands  AS NEEDED FOR flares. Avoid face, groin, axilla   ipratropium 0.06 % nasal spray Commonly known as: ATROVENT Place into the nose.   levocetirizine 5 MG tablet Commonly known as: XYZAL Take 5 mg by mouth every evening.   methimazole 5 MG tablet Commonly known as: TAPAZOLE Take 5 mg by mouth daily.   metroNIDAZOLE  0.75 % cream Commonly known as: METROCREAM  Apply topically at bedtime. TO FACE FOR ROSACEA   multivitamin tablet Take 1 tablet by mouth daily.   pantoprazole 40 MG tablet Commonly known as: PROTONIX Take 40 mg by mouth 2 (two) times daily.   polyethylene glycol 17 g packet Commonly known as: MiraLax  Take 17 g by mouth daily.   senna-docusate 8.6-50 MG tablet Commonly known as: Senokot-S Take 2 tablets by mouth 2 (two) times daily.   silodosin  8 MG Caps capsule Commonly known as: RAPAFLO  Take 1 capsule (8 mg total) by mouth daily with breakfast.   Simethicone 180 MG Caps Take 125 mg by mouth every 6 (six) hours as needed for  flatulence. (Phazyme)   sucralfate 1 GM/10ML suspension Commonly known as: CARAFATE Take 1 g by mouth 2 (two) times daily.   Vitamin A 2400 MCG (8000 UT) Tabs Take by mouth daily.   vitamin C 1000 MG tablet Take 1,000 mg by mouth 2 (two) times daily.   zinc gluconate 50 MG tablet Take 50 mg by mouth daily.   zinc sulfate (50mg  elemental zinc) 220 (50 Zn) MG capsule Take 220 mg by mouth daily.        Allergies:  Allergies  Allergen Reactions   Levofloxacin     Other Reaction(s): Other (See Comments)  Increased neuropathy and tightness   Tetracycline Hcl     Other Reaction(s): inflamed genitals   Ciprofloxacin Other (See Comments) and Rash    Joint aches  Other Reaction(s): Abdominal Pain, Other (See Comments)   Other Rash    Other reaction(s): Unknown Inflammation   Penicillin G Sodium Rash   Penicillins Rash   Sulfa Antibiotics Rash   Sulfacetamide Sodium Rash   Tetracyclines & Related Other (See Comments)     Inflammation     Family History: No family history on file.  Social History:  reports that he has never smoked. He has never used smokeless tobacco. He reports current alcohol  use. He reports that he does not use drugs.  ROS: Pertinent ROS in HPI  Physical Exam: There were no vitals taken for this visit.  Constitutional:  Well nourished. Alert and oriented, No acute distress. HEENT: [place] AT, moist mucus membranes.  Trachea midline, no masses. Cardiovascular: No clubbing, cyanosis, or edema. Respiratory: Normal respiratory effort, no increased work of breathing. GI: Abdomen is soft, non tender, non distended, no abdominal masses. Liver and spleen not palpable.  No hernias appreciated.  Stool sample for occult testing is not indicated.   GU: No CVA tenderness.  No bladder fullness or masses.  Patient with circumcised/uncircumcised phallus. ***Foreskin easily retracted***  Urethral meatus is patent.  No penile discharge. No penile lesions or rashes. Scrotum without lesions, cysts, rashes and/or edema.  Testicles are located scrotally bilaterally. No masses are appreciated in the testicles. Left and right epididymis are normal. Rectal: Patient with  normal sphincter tone. Anus and perineum without scarring or rashes. No rectal masses are appreciated. Prostate is approximately *** grams, *** nodules are appreciated. Seminal vesicles are normal. Skin: No rashes, bruises or suspicious lesions. Lymph: No cervical or inguinal adenopathy. Neurologic: Grossly intact, no focal deficits, moving all 4 extremities. Psychiatric: Normal mood and affect.  Laboratory Data: CBC w/auto Differential (5 Part) Order: [id number] Component Ref Range & Units 10 d ago  WBC (White Blood Cell Count) 4.1 - 10.2 10^3/uL 6.6  RBC (Red Blood Cell Count) 4.69 - 6.13 10^6/uL 4.95  Hemoglobin 14.1 - 18.1 gm/dL 95.6  Hematocrit 21.3 - 52.0 % 45.8  MCV (Mean Corpuscular Volume) 80.0 - 100.0 fl 92.5  MCH (Mean  Corpuscular Hemoglobin) 27.0 - 31.2 pg 31.7 High   MCHC (Mean Corpuscular Hemoglobin Concentration) 32.0 - 36.0 gm/dL 08.6  Platelet Count 578 - 450 10^3/uL 156  RDW-CV (Red Cell Distribution Width) 11.6 - 14.8 % 12.7  MPV (Mean Platelet Volume) 9.4 - 12.4 fl 10.3  Neutrophils 1.50 - 7.80 10^3/uL 4.74  Lymphocytes 1.00 - 3.60 10^3/uL 1.25  Monocytes 0.00 - 1.50 10^3/uL 0.47  Eosinophils 0.00 - 0.55 10^3/uL 0.06  Basophils 0.00 - 0.09 10^3/uL 0.02  Neutrophil % 32.0 - 70.0 % 72.1 High   Lymphocyte % 10.0 - 50.0 %  19  Monocyte % 4.0 - 13.0 % 7.2  Eosinophil % 1.0 - 5.0 % 0.9 Low   Basophil% 0.0 - 2.0 % 0.3  Immature Granulocyte % <=0.7 % 0.5  Immature Granulocyte Count <=0.06 10^3/L 0.03  Resulting Agency [facility]   Specimen Collected: 02/01/24 16:12   Performed by: [facility] Last Resulted: 02/01/24 16:19  Received From: [facility]  Result Received: 02/06/24 11:39   Comprehensive Metabolic Panel (CMP) Order: [id number] Component Ref Range & Units 10 d ago  Glucose 70 - 110 mg/dL 086  Sodium 578 - 469 mmol/L 139  Potassium 3.6 - 5.1 mmol/L 4.4  Chloride 97 - 109 mmol/L 101  Carbon Dioxide (CO2) 22.0 - 32.0 mmol/L 32.4 High   Urea Nitrogen (BUN) 7 - 25 mg/dL 13  Creatinine 0.7 - 1.3 mg/dL 1  Glomerular Filtration Rate (eGFR) >60 mL/min/1.73sq m 77  Comment: CKD-EPI (2021) does not include patient's race in the calculation of eGFR.  Monitoring changes of plasma creatinine and eGFR over time is useful for monitoring kidney function.  Interpretive Ranges for eGFR (CKD-EPI 2021):  eGFR:       >60 mL/min/1.73 sq. m - Normal eGFR:       30-59 mL/min/1.73 sq. m - Moderately Decreased eGFR:       15-29 mL/min/1.73 sq. m  - Severely Decreased eGFR:       < 15 mL/min/1.73 sq. m  - Kidney Failure   Note: These eGFR calculations do not apply in acute situations when eGFR is changing rapidly or patients on  dialysis.  Calcium 8.7 - 10.3 mg/dL 9.5  AST 8 - 39 U/L 23  ALT 6 - 57 U/L 26  Alk Phos (alkaline Phosphatase) 34 - 104 U/L 71  Albumin 3.5 - 4.8 g/dL 4.6  Bilirubin, Total 0.3 - 1.2 mg/dL 0.6  Protein, Total 6.1 - 7.9 g/dL 7.3  A/G Ratio 1.0 - 5.0 gm/dL 1.7  Resulting Agency [facility]   Specimen Collected: 02/01/24 16:12   Performed by: [facility] Last Resulted: 02/01/24 16:48  Received From: [facility]  Result Received: 02/06/24 11:39   Urinalysis See EPIC and HPI  I have reviewed the labs.   Pertinent Imaging: ***  Assessment & Plan:  ***  1. Incomplete bladder emptying - PVR ***   No follow-ups on file.  These notes generated with voice recognition software. I apologize for typographical errors.  Briant Camper  [facility] [address]  [address] [place], [place] [address] [phone]

## 2024-02-13 ENCOUNTER — Encounter: Payer: Self-pay | Admitting: Urology

## 2024-02-13 ENCOUNTER — Ambulatory Visit: Admitting: Urology

## 2024-02-13 VITALS — BP 105/61 | HR 101 | Ht 66.0 in | Wt 166.0 lb

## 2024-02-13 DIAGNOSIS — R3129 Other microscopic hematuria: Secondary | ICD-10-CM

## 2024-02-13 DIAGNOSIS — R339 Retention of urine, unspecified: Secondary | ICD-10-CM | POA: Diagnosis not present

## 2024-02-13 DIAGNOSIS — R3914 Feeling of incomplete bladder emptying: Secondary | ICD-10-CM

## 2024-02-13 DIAGNOSIS — Z1331 Encounter for screening for depression: Secondary | ICD-10-CM | POA: Diagnosis not present

## 2024-02-13 DIAGNOSIS — J4530 Mild persistent asthma, uncomplicated: Secondary | ICD-10-CM | POA: Diagnosis not present

## 2024-02-13 DIAGNOSIS — G4733 Obstructive sleep apnea (adult) (pediatric): Secondary | ICD-10-CM | POA: Diagnosis not present

## 2024-02-13 DIAGNOSIS — J301 Allergic rhinitis due to pollen: Secondary | ICD-10-CM | POA: Diagnosis not present

## 2024-02-13 DIAGNOSIS — K219 Gastro-esophageal reflux disease without esophagitis: Secondary | ICD-10-CM | POA: Diagnosis not present

## 2024-02-13 LAB — URINALYSIS, COMPLETE
Bilirubin, UA: NEGATIVE
Glucose, UA: NEGATIVE
Ketones, UA: NEGATIVE
Leukocytes,UA: NEGATIVE
Nitrite, UA: NEGATIVE
Protein,UA: NEGATIVE
Specific Gravity, UA: 1.02 (ref 1.005–1.030)
Urobilinogen, Ur: 0.2 mg/dL (ref 0.2–1.0)
pH, UA: 6 (ref 5.0–7.5)

## 2024-02-13 LAB — MICROSCOPIC EXAMINATION

## 2024-02-13 LAB — BLADDER SCAN AMB NON-IMAGING

## 2024-02-15 DIAGNOSIS — J301 Allergic rhinitis due to pollen: Secondary | ICD-10-CM | POA: Diagnosis not present

## 2024-02-18 LAB — CULTURE, URINE COMPREHENSIVE

## 2024-02-20 ENCOUNTER — Ambulatory Visit: Payer: Self-pay | Admitting: Urology

## 2024-02-20 ENCOUNTER — Ambulatory Visit
Admission: RE | Admit: 2024-02-20 | Discharge: 2024-02-20 | Disposition: A | Discharge status: Home / Self Care | Source: Ambulatory Visit | Attending: Urology | Admitting: Urology

## 2024-02-20 DIAGNOSIS — R3129 Other microscopic hematuria: Secondary | ICD-10-CM | POA: Insufficient documentation

## 2024-02-20 DIAGNOSIS — K5750 Diverticulosis of both small and large intestine without perforation or abscess without bleeding: Secondary | ICD-10-CM | POA: Diagnosis not present

## 2024-02-20 DIAGNOSIS — N200 Calculus of kidney: Secondary | ICD-10-CM | POA: Diagnosis not present

## 2024-02-20 DIAGNOSIS — K5900 Constipation, unspecified: Secondary | ICD-10-CM | POA: Diagnosis not present

## 2024-02-20 DIAGNOSIS — N281 Cyst of kidney, acquired: Secondary | ICD-10-CM | POA: Diagnosis not present

## 2024-02-20 MED ORDER — IOHEXOL 300 MG/ML  SOLN
100.0000 mL | Freq: Once | INTRAMUSCULAR | Status: AC | PRN
Start: 2024-02-20 — End: 2024-02-20
  Administered 2024-02-20: 100 mL via INTRAVENOUS

## 2024-02-22 DIAGNOSIS — J301 Allergic rhinitis due to pollen: Secondary | ICD-10-CM | POA: Diagnosis not present

## 2024-03-04 DIAGNOSIS — M542 Cervicalgia: Secondary | ICD-10-CM | POA: Diagnosis not present

## 2024-03-04 DIAGNOSIS — R519 Headache, unspecified: Secondary | ICD-10-CM | POA: Diagnosis not present

## 2024-03-04 DIAGNOSIS — M9901 Segmental and somatic dysfunction of cervical region: Secondary | ICD-10-CM | POA: Diagnosis not present

## 2024-03-04 DIAGNOSIS — M9903 Segmental and somatic dysfunction of lumbar region: Secondary | ICD-10-CM | POA: Diagnosis not present

## 2024-03-06 ENCOUNTER — Ambulatory Visit: Admitting: Urology

## 2024-03-06 VITALS — BP 115/78 | HR 81 | Ht 66.0 in | Wt 165.0 lb

## 2024-03-06 DIAGNOSIS — R3914 Feeling of incomplete bladder emptying: Secondary | ICD-10-CM | POA: Diagnosis not present

## 2024-03-06 DIAGNOSIS — R3129 Other microscopic hematuria: Secondary | ICD-10-CM

## 2024-03-06 LAB — MICROSCOPIC EXAMINATION: RBC, Urine: >30 /HPF — AB (ref 0–2)

## 2024-03-06 LAB — URINALYSIS, COMPLETE
Bilirubin, UA: NEGATIVE
Glucose, UA: NEGATIVE
Ketones, UA: NEGATIVE
Leukocytes,UA: NEGATIVE
Nitrite, UA: NEGATIVE
Protein,UA: NEGATIVE
Specific Gravity, UA: 1.015 (ref 1.005–1.030)
Urobilinogen, Ur: 0.2 mg/dL (ref 0.2–1.0)
pH, UA: 6 (ref 5.0–7.5)

## 2024-03-06 NOTE — Progress Notes (Signed)
 03/06/2024 7:50 PM   Jimmy Obrien 1945-11-15 [id number]  Referring provider: Rudolpho Jimmy BIRCH, MD [address] [facility] [address] [facility] [place],  [place] [address]  Urological history: 1. BPH with LU TS - PSA (09/2023) 0.04 - remote history of TURP - silodosin  8 mg daily and dutasteride  0.5 mg daily  2. Nephrolithiasis - ESWL (2020)  Chief Complaint  Patient presents with   incomplete bladder emptying   HPI: Jimmy Obrien is a 78 y.o. man who presents today for CT scan results.   Previous records reviewed.   At her visit on 02/13/2024, In late December early January, he experienced difficulty urinating and passed several small stones.  He was seen in urgent care and although his urinalysis was not consistent with a UTI he was given cefdinir as a precaution and advised to contact us  if he had persistent or recurrent symptoms.   He was seen again by urgent care for similar symptoms back in April and prescribed Omnicef.  Urine culture was negative.  He is continuing to experience urgency, feelings of include bladder emptying and split urinary stream.  Patient denies any modifying or aggravating factors.  Patient denies any recent UTI's, gross hematuria, dysuria or suprapubic/flank pain.  Patient denies any fevers, chills, nausea or vomiting.   UA yellow clear, specific already 1.020, pH 6.0, trace heme, 0-5 WBCs, 3-10 RBCs, 0-2 epithelial cells, mucus threads present and a few bacteria.   PVR 13 mL.  Urine culture was positive for 4,000 colonies of staph epidermidis.    CTU ([id number]) -  2 punctate stones in the right kidney the left renal cysts.  A small portion of the left anterior bladder extending into the left inguinal canal present since 2020  He is having persistent frequency and urgency.  Patient denies any modifying or aggravating factors.  Patient denies any recent UTI's, gross hematuria, dysuria or suprapubic/flank pain.  Patient denies any fevers, chills, nausea or vomiting.     UA with greater than 30 RBCs and a few bacteria.  PMH: Past Medical History:  Diagnosis Date   Actinic keratosis    Asthma    exercise induced   BPH (benign prostatic hyperplasia)    Diverticulitis    Diverticulosis    Fatty liver    GERD (gastroesophageal reflux disease)    History of kidney stones    Hx of atrial fibrillation without current medication    Hypertension    Hyperthyroidism    Multinodular Goiter   Sleep apnea    No CPAP   Thrombocytopenia (HCC)     Surgical History: Past Surgical History:  Procedure Laterality Date   BIOPSY  05/23/2023   Procedure: BIOPSY;  Surgeon: Aundria, Ladell POUR, MD;  Location: [facility] ENDOSCOPY;  Service: Gastroenterology;;   CATARACT EXTRACTION W/PHACO Right 07/13/2021   Procedure: CATARACT EXTRACTION PHACO AND INTRAOCULAR LENS PLACEMENT (IOC) RIGHT 2.92 00:50.2;  Surgeon: Mittie Gaskin, MD;  Location: [facility];  Service: Ophthalmology;  Laterality: Right;   CATARACT EXTRACTION W/PHACO Left 07/27/2021   Procedure: CATARACT EXTRACTION PHACO AND INTRAOCULAR LENS PLACEMENT (IOC) LEFT 3.67 00:39.7;  Surgeon: Mittie Gaskin, MD;  Location: [facility];  Service: Ophthalmology;  Laterality: Left;   CHOLECYSTECTOMY     COLON RESECTION     COLONOSCOPY WITH PROPOFOL  N/A 03/31/2019   Procedure: COLONOSCOPY WITH PROPOFOL ;  Surgeon: Toledo, Ladell POUR, MD;  Location: [facility] ENDOSCOPY;  Service: Endoscopy;  Laterality: N/A;   COLONOSCOPY WITH PROPOFOL  N/A 05/23/2023   Procedure:  COLONOSCOPY WITH PROPOFOL ;  Surgeon: Toledo, Ladell POUR, MD;  Location: [facility] ENDOSCOPY;  Service: Gastroenterology;  Laterality: N/A;   ESOPHAGOGASTRODUODENOSCOPY N/A 05/23/2023   Procedure: ESOPHAGOGASTRODUODENOSCOPY (EGD);  Surgeon: Toledo, Ladell POUR, MD;  Location: [facility] ENDOSCOPY;  Service: Gastroenterology;  Laterality: N/A;   ESOPHAGOGASTRODUODENOSCOPY (EGD) WITH PROPOFOL  N/A 08/16/2016   Procedure: ESOPHAGOGASTRODUODENOSCOPY (EGD) WITH PROPOFOL ;   Surgeon: Lamar ONEIDA Holmes, MD;  Location: [facility] ENDOSCOPY;  Service: Endoscopy;  Laterality: N/A;   ESOPHAGOGASTRODUODENOSCOPY (EGD) WITH PROPOFOL  N/A 03/31/2019   Procedure: ESOPHAGOGASTRODUODENOSCOPY (EGD) WITH PROPOFOL ;  Surgeon: Toledo, Ladell POUR, MD;  Location: [facility] ENDOSCOPY;  Service: Endoscopy;  Laterality: N/A;   EXTRACORPOREAL SHOCK WAVE LITHOTRIPSY Left 05/15/2019   Procedure: EXTRACORPOREAL SHOCK WAVE LITHOTRIPSY (ESWL);  Surgeon: Francisca Redell BROCKS, MD;  Location: [facility] ORS;  Service: Urology;  Laterality: Left;   POLYPECTOMY  05/23/2023   Procedure: POLYPECTOMY;  Surgeon: Toledo, Teodoro K, MD;  Location: [facility] ENDOSCOPY;  Service: Gastroenterology;;   TRANSURETHRAL RESECTION OF PROSTATE      Home Medications:  Allergies as of 03/06/2024       Reactions   Levofloxacin    Other Reaction(s): Other (See Comments) Increased neuropathy and tightness   Tetracycline Hcl    Other Reaction(s): inflamed genitals   Ciprofloxacin Other (See Comments), Rash   Joint aches Other Reaction(s): Abdominal Pain, Other (See Comments)   Other Rash   Other reaction(s): Unknown Inflammation   Penicillin G Sodium Rash   Penicillins Rash   Sulfa Antibiotics Rash   Sulfacetamide Sodium Rash   Tetracyclines & Related Other (See Comments)   Inflammation        Medication List        Accurate as of March 06, 2024 11:59 PM. If you have any questions, ask your nurse or doctor.          acetaminophen  650 MG CR tablet Commonly known as: TYLENOL  Take 1,300 mg by mouth 2 (two) times daily.   albuterol  108 (90 Base) MCG/ACT inhaler Commonly known as: VENTOLIN  HFA Inhale 2 puffs into the lungs every 6 (six) hours as needed for wheezing or shortness of breath.   amphetamine-dextroamphetamine 10 MG tablet Commonly known as: ADDERALL Take 10 mg by mouth daily with breakfast.   Arnuity Ellipta 200 MCG/ACT Aepb Generic drug: Fluticasone Furoate Inhale 1 puff into the lungs daily.   aspirin EC 81  MG tablet Take 81 mg by mouth daily.   atenolol 25 MG tablet Commonly known as: TENORMIN Take 12.5 mg by mouth daily.   azelastine 0.1 % nasal spray Commonly known as: ASTELIN Place 1 spray into both nostrils 2 (two) times daily. Use in each nostril as directed   budesonide 32 MCG/ACT nasal spray Commonly known as: RHINOCORT AQUA Place 1 spray into both nostrils daily.   Cholecalciferol 250 MCG (10000 UT) Caps Take 2,000 Units by mouth 1 day or 1 dose.   cyanocobalamin 1000 MCG tablet Take 1,000 mcg by mouth in the morning and at bedtime.   Dapsone  7.5 % Gel Commonly known as: Aczone  Apply to face twice a day   diazepam  5 MG tablet Commonly known as: VALIUM  Take 5 mg by mouth 2 (two) times daily as needed.   dutasteride  0.5 MG capsule Commonly known as: AVODART  Take 1 capsule (0.5 mg total) by mouth daily.   EPINEPHrine  0.3 mg/0.3 mL Soaj injection Commonly known as: EPI-PEN Inject 0.3 mg into the muscle as needed.   FLEXALL ULTRA PLUS EX Apply 1 application topically as needed.  Flovent HFA 110 MCG/ACT inhaler Generic drug: fluticasone Inhale 2 puffs into the lungs.   gentamicin ointment 0.1 % Commonly known as: GARAMYCIN Apply 1 application topically 2 (two) times daily. In nose   halobetasol  0.05 % cream Commonly known as: ULTRAVATE  Apply topically as directed for 14 days, ONCE A DAY up to 5 days a week to AFFECTED AREAS OF psoriasis on hands AS NEEDED FOR flares. Avoid face, groin, axilla   ipratropium 0.06 % nasal spray Commonly known as: ATROVENT Place into the nose.   methimazole 5 MG tablet Commonly known as: TAPAZOLE Take 5 mg by mouth daily.   metroNIDAZOLE  0.75 % cream Commonly known as: METROCREAM  Apply topically at bedtime. TO FACE FOR ROSACEA   multivitamin tablet Take 1 tablet by mouth daily.   pantoprazole 40 MG tablet Commonly known as: PROTONIX Take 40 mg by mouth 2 (two) times daily.   polyethylene glycol 17 g packet Commonly  known as: MiraLax  Take 17 g by mouth daily.   senna-docusate 8.6-50 MG tablet Commonly known as: Senokot-S Take 2 tablets by mouth 2 (two) times daily.   silodosin  8 MG Caps capsule Commonly known as: RAPAFLO  Take 1 capsule (8 mg total) by mouth daily with breakfast.   Simethicone 180 MG Caps Take 125 mg by mouth every 6 (six) hours as needed for flatulence. (Phazyme)   Vitamin A 2400 MCG (8000 UT) Tabs Take by mouth daily.   vitamin C 1000 MG tablet Take 1,000 mg by mouth 2 (two) times daily.   zinc gluconate 50 MG tablet Take 50 mg by mouth daily.        Allergies:  Allergies  Allergen Reactions   Levofloxacin     Other Reaction(s): Other (See Comments)  Increased neuropathy and tightness   Tetracycline Hcl     Other Reaction(s): inflamed genitals   Ciprofloxacin Other (See Comments) and Rash    Joint aches  Other Reaction(s): Abdominal Pain, Other (See Comments)   Other Rash    Other reaction(s): Unknown Inflammation   Penicillin G Sodium Rash   Penicillins Rash   Sulfa Antibiotics Rash   Sulfacetamide Sodium Rash   Tetracyclines & Related Other (See Comments)    Inflammation     Family History: No family history on file.  Social History:  reports that he has never smoked. He has never used smokeless tobacco. He reports current alcohol  use. He reports that he does not use drugs.  ROS: Pertinent ROS in HPI  Physical Exam: BP 115/78   Pulse 81   Ht 5' 6 (1.676 m)   Wt 165 lb (74.8 kg)   BMI 26.63 kg/m   Constitutional:  Well nourished. Alert and oriented, No acute distress. HEENT: [place] AT, moist mucus membranes.  Trachea midline, no masses. Cardiovascular: No clubbing, cyanosis, or edema. Respiratory: Normal respiratory effort, no increased work of breathing. Neurologic: Grossly intact, no focal deficits, moving all 4 extremities. Psychiatric: Normal mood and affect.   Laboratory Data: Urinalysis See EPIC and HPI  I have reviewed the  labs.   Pertinent Imaging: EXAM:  CT ABDOMEN PELVIS WITHOUT THEN WITH IV CONTRAST   INDICATION:  microscopic hematuria   TECHNIQUE: Spiral CT scanning was performed through the abdomen and pelvis before and after the patient received IV contrast.   COMPARISON: 11/07/2022   FINDINGS: There is no significant abnormality identified in the lung bases. There has been improvement in the hepatic steatosis since the previous CT scan. The gallbladder is surgically absent. The  spleen and pancreas are within normal limits The gallbladder is surgically absent. A 3 mm nonobstructive calculus is present in the right mid pole. There is a 1 mm lower pole calculus on the right. Fluid attenuation and hyperdense cysts are present in the left kidney which do not require follow-up. No soft tissue mass is identified. There is no ureteral calculus or obstruction. There are no adrenal masses. A stable duodenal diverticulum is present. There is descending colonic diverticulosis with no associated inflammation. Moderate stool retention is present. There is no evidence of ascites or adenopathy. No abdominal aortic aneurysm is present.   A small portion of the left anterior bladder extends into the left inguinal canal. This is unchanged. There is no bladder mass, wall thickening or calculus. There is sigmoid diverticulosis with no associated inflammation.   There is no fracture or bone destruction.   IMPRESSION: 1. Right nephrolithiasis. 2. No evidence of urinary tract mass or obstruction. 3. Additional benign nonacute findings as described above.     Please note that CT scanning at this site utilizes multiple dose reduction techniques, including automatic exposure control, adjustment of the MAA and/or KVP according to the patient's size, and use of iterative reconstruction.   Electronically signed by: Eddy Oar MD 02/20/2024 03:55 PM EDT RP Workstation: [id number] I have independently reviewed the  films.  See HPI.     Assessment & Plan:    1. Microscopic hematuria   - UA w/ increasing micro heme - urine culture pending - Explained that the CT urogram did not identify any worrisome findings - recommend undergoing cystoscopy - Explained that the cystoscopy is a safe and common diagnostic test performed by one of our physicians in the office.  It consist of using a thin, lighted tube to look directly inside the bladder, prostate  and urethra to evaluate the anatomy.  The procedure is brief, typically taking about 5 minutes. -This will enable us  to assess bladder health, diagnose and enlarged prostate, assess which BPH procedure may be most appropriate and rule out other bladder conditions (stricture disease, stones, cancer, etc.)  -Advised the patient that there are no restrictions to eating or drinking prior to the cystoscopy -They can continue to take all of their medications as prescribed -They can drive themselves to and from the appointment -I explained that during the procedure, the area around the urethra will be cleansed thoroughly, topical anesthetic will be applied to numb your urethra, the thin tube is then gently inserted through the urethra into your bladder while fluid flows through the tube to the bladder to enable better visualization -I explained the procedure is usually not painful, however there may be some discomfort (pinching feeling), and they may feel an urge to urinate, coolness or fullness in the bladder and then the cystoscope is removed -After the cystoscopy, I advised them that they may experience urinary frequency, hematuria, dysuria which will resolve within 24 to 48 hours -Reviewed red flag signs (fever, bright red blood or blood clots in the urine, abdominal pain or difficulty urinating) and to contact the office immediately or seek treatment in the ED if they should experience any of these -The physician will discuss the results of the cystoscopy at the time of  the procedure  - he is agreeable and wants to proceed  2. Feeling of Incomplete bladder emptying - PVR demonstrates adequate emptying - Continue silodosin  0.8 mg and dutasteride  0.5 mg daily - likely secondary to BPH, but cysto is pending for further  evaluation   Return for cystoscopy for microscopic hematuria .  These notes generated with voice recognition software. I apologize for typographical errors.  Jimmy Obrien  [facility] [address]  [address] [place], [place] [address] [phone]

## 2024-03-09 ENCOUNTER — Encounter: Payer: Self-pay | Admitting: Urology

## 2024-03-10 LAB — CULTURE, URINE COMPREHENSIVE

## 2024-03-11 DIAGNOSIS — R7303 Prediabetes: Secondary | ICD-10-CM | POA: Diagnosis not present

## 2024-03-18 DIAGNOSIS — R799 Abnormal finding of blood chemistry, unspecified: Secondary | ICD-10-CM | POA: Diagnosis not present

## 2024-03-18 DIAGNOSIS — I480 Paroxysmal atrial fibrillation: Secondary | ICD-10-CM | POA: Diagnosis not present

## 2024-03-18 DIAGNOSIS — J4530 Mild persistent asthma, uncomplicated: Secondary | ICD-10-CM | POA: Diagnosis not present

## 2024-03-18 DIAGNOSIS — R7303 Prediabetes: Secondary | ICD-10-CM | POA: Diagnosis not present

## 2024-03-18 DIAGNOSIS — D696 Thrombocytopenia, unspecified: Secondary | ICD-10-CM | POA: Diagnosis not present

## 2024-03-18 DIAGNOSIS — G4733 Obstructive sleep apnea (adult) (pediatric): Secondary | ICD-10-CM | POA: Diagnosis not present

## 2024-03-18 DIAGNOSIS — I10 Essential (primary) hypertension: Secondary | ICD-10-CM | POA: Diagnosis not present

## 2024-03-18 DIAGNOSIS — N400 Enlarged prostate without lower urinary tract symptoms: Secondary | ICD-10-CM | POA: Diagnosis not present

## 2024-03-21 DIAGNOSIS — J301 Allergic rhinitis due to pollen: Secondary | ICD-10-CM | POA: Diagnosis not present

## 2024-03-26 DIAGNOSIS — J301 Allergic rhinitis due to pollen: Secondary | ICD-10-CM | POA: Diagnosis not present

## 2024-03-28 DIAGNOSIS — J301 Allergic rhinitis due to pollen: Secondary | ICD-10-CM | POA: Diagnosis not present

## 2024-04-01 DIAGNOSIS — M542 Cervicalgia: Secondary | ICD-10-CM | POA: Diagnosis not present

## 2024-04-01 DIAGNOSIS — M9903 Segmental and somatic dysfunction of lumbar region: Secondary | ICD-10-CM | POA: Diagnosis not present

## 2024-04-01 DIAGNOSIS — R519 Headache, unspecified: Secondary | ICD-10-CM | POA: Diagnosis not present

## 2024-04-01 DIAGNOSIS — M9901 Segmental and somatic dysfunction of cervical region: Secondary | ICD-10-CM | POA: Diagnosis not present

## 2024-04-04 DIAGNOSIS — J301 Allergic rhinitis due to pollen: Secondary | ICD-10-CM | POA: Diagnosis not present

## 2024-04-07 ENCOUNTER — Encounter: Payer: Self-pay | Admitting: Urology

## 2024-04-11 DIAGNOSIS — J301 Allergic rhinitis due to pollen: Secondary | ICD-10-CM | POA: Diagnosis not present

## 2024-04-16 ENCOUNTER — Ambulatory Visit: Admitting: Urology

## 2024-04-16 VITALS — BP 120/77 | HR 79 | Ht 66.0 in | Wt 166.0 lb

## 2024-04-16 DIAGNOSIS — R3129 Other microscopic hematuria: Secondary | ICD-10-CM

## 2024-04-16 MED ORDER — NITROFURANTOIN MONOHYD MACRO 100 MG PO CAPS
100.0000 mg | ORAL_CAPSULE | Freq: Once | ORAL | Status: AC
  Administered 2024-04-16: 100 mg via ORAL

## 2024-04-16 MED ORDER — LIDOCAINE HCL URETHRAL/MUCOSAL 2 % EX GEL
1.0000 | Freq: Once | CUTANEOUS | Status: AC
  Administered 2024-04-16: 1 via URETHRAL

## 2024-04-16 NOTE — Progress Notes (Signed)
 Cystoscopy Procedure Note:  Indication: Microscopic hematuria  Nitrofurantoin  given for prophylaxis  After informed consent and discussion of the procedure and its risks, ETTORE TREBILCOCK was positioned and prepped in the standard fashion. Cystoscopy was performed with a flexible cystoscope. The urethra, bladder neck and entire bladder was visualized in a standard fashion. The prostate was open consistent with prior TURP, easily accommodated scope, no evidence of stricture. The ureteral orifices were visualized in their normal location and orientation.  Bladder mucosa with mild to moderate trabeculations, no suspicious lesions, no abnormalities on retroflexion.  Portion of bladder in inguinal hernia.  Imaging: CT urogram with no worrisome urologic findings  Findings: Normal cystoscopy  Assessment and Plan: Behavioral strategies discussed regarding urinary frequency, RTC machine in 2 to 3 months PVR and symptom check  Redell Burnet, MD 04/16/2024

## 2024-04-18 DIAGNOSIS — J301 Allergic rhinitis due to pollen: Secondary | ICD-10-CM | POA: Diagnosis not present

## 2024-04-29 DIAGNOSIS — M9903 Segmental and somatic dysfunction of lumbar region: Secondary | ICD-10-CM | POA: Diagnosis not present

## 2024-04-29 DIAGNOSIS — M9901 Segmental and somatic dysfunction of cervical region: Secondary | ICD-10-CM | POA: Diagnosis not present

## 2024-04-29 DIAGNOSIS — R519 Headache, unspecified: Secondary | ICD-10-CM | POA: Diagnosis not present

## 2024-04-29 DIAGNOSIS — M542 Cervicalgia: Secondary | ICD-10-CM | POA: Diagnosis not present

## 2024-05-06 DIAGNOSIS — J029 Acute pharyngitis, unspecified: Secondary | ICD-10-CM | POA: Diagnosis not present

## 2024-05-06 DIAGNOSIS — Z889 Allergy status to unspecified drugs, medicaments and biological substances status: Secondary | ICD-10-CM | POA: Diagnosis not present

## 2024-05-06 DIAGNOSIS — J019 Acute sinusitis, unspecified: Secondary | ICD-10-CM | POA: Diagnosis not present

## 2024-05-06 DIAGNOSIS — Z03818 Encounter for observation for suspected exposure to other biological agents ruled out: Secondary | ICD-10-CM | POA: Diagnosis not present

## 2024-05-06 DIAGNOSIS — B9689 Other specified bacterial agents as the cause of diseases classified elsewhere: Secondary | ICD-10-CM | POA: Diagnosis not present

## 2024-05-06 DIAGNOSIS — Z23 Encounter for immunization: Secondary | ICD-10-CM | POA: Diagnosis not present

## 2024-05-14 DIAGNOSIS — L02212 Cutaneous abscess of back [any part, except buttock]: Secondary | ICD-10-CM | POA: Diagnosis not present

## 2024-05-14 DIAGNOSIS — Z889 Allergy status to unspecified drugs, medicaments and biological substances status: Secondary | ICD-10-CM | POA: Diagnosis not present

## 2024-05-23 DIAGNOSIS — J301 Allergic rhinitis due to pollen: Secondary | ICD-10-CM | POA: Diagnosis not present

## 2024-05-27 DIAGNOSIS — M9903 Segmental and somatic dysfunction of lumbar region: Secondary | ICD-10-CM | POA: Diagnosis not present

## 2024-05-27 DIAGNOSIS — M542 Cervicalgia: Secondary | ICD-10-CM | POA: Diagnosis not present

## 2024-05-27 DIAGNOSIS — R519 Headache, unspecified: Secondary | ICD-10-CM | POA: Diagnosis not present

## 2024-05-27 DIAGNOSIS — M9901 Segmental and somatic dysfunction of cervical region: Secondary | ICD-10-CM | POA: Diagnosis not present

## 2024-05-29 ENCOUNTER — Ambulatory Visit: Payer: PPO | Admitting: Dermatology

## 2024-05-30 DIAGNOSIS — J301 Allergic rhinitis due to pollen: Secondary | ICD-10-CM | POA: Diagnosis not present

## 2024-06-10 ENCOUNTER — Other Ambulatory Visit: Payer: Self-pay | Admitting: Urology

## 2024-06-11 DIAGNOSIS — J301 Allergic rhinitis due to pollen: Secondary | ICD-10-CM | POA: Diagnosis not present

## 2024-06-12 DIAGNOSIS — J4530 Mild persistent asthma, uncomplicated: Secondary | ICD-10-CM | POA: Diagnosis not present

## 2024-06-12 DIAGNOSIS — G4733 Obstructive sleep apnea (adult) (pediatric): Secondary | ICD-10-CM | POA: Diagnosis not present

## 2024-06-12 DIAGNOSIS — G4710 Hypersomnia, unspecified: Secondary | ICD-10-CM | POA: Diagnosis not present

## 2024-06-13 DIAGNOSIS — J301 Allergic rhinitis due to pollen: Secondary | ICD-10-CM | POA: Diagnosis not present

## 2024-06-16 ENCOUNTER — Ambulatory Visit: Admitting: Dermatology

## 2024-06-16 ENCOUNTER — Encounter: Payer: Self-pay | Admitting: Dermatology

## 2024-06-16 DIAGNOSIS — L814 Other melanin hyperpigmentation: Secondary | ICD-10-CM

## 2024-06-16 DIAGNOSIS — Z1283 Encounter for screening for malignant neoplasm of skin: Secondary | ICD-10-CM

## 2024-06-16 DIAGNOSIS — L409 Psoriasis, unspecified: Secondary | ICD-10-CM

## 2024-06-16 DIAGNOSIS — L719 Rosacea, unspecified: Secondary | ICD-10-CM | POA: Diagnosis not present

## 2024-06-16 DIAGNOSIS — L821 Other seborrheic keratosis: Secondary | ICD-10-CM | POA: Diagnosis not present

## 2024-06-16 DIAGNOSIS — L710 Perioral dermatitis: Secondary | ICD-10-CM

## 2024-06-16 DIAGNOSIS — L578 Other skin changes due to chronic exposure to nonionizing radiation: Secondary | ICD-10-CM | POA: Diagnosis not present

## 2024-06-16 DIAGNOSIS — L729 Follicular cyst of the skin and subcutaneous tissue, unspecified: Secondary | ICD-10-CM

## 2024-06-16 DIAGNOSIS — L720 Epidermal cyst: Secondary | ICD-10-CM

## 2024-06-16 DIAGNOSIS — L570 Actinic keratosis: Secondary | ICD-10-CM | POA: Diagnosis not present

## 2024-06-16 DIAGNOSIS — W908XXA Exposure to other nonionizing radiation, initial encounter: Secondary | ICD-10-CM | POA: Diagnosis not present

## 2024-06-16 DIAGNOSIS — L80 Vitiligo: Secondary | ICD-10-CM

## 2024-06-16 DIAGNOSIS — N481 Balanitis: Secondary | ICD-10-CM | POA: Diagnosis not present

## 2024-06-16 DIAGNOSIS — Z79899 Other long term (current) drug therapy: Secondary | ICD-10-CM

## 2024-06-16 DIAGNOSIS — D229 Melanocytic nevi, unspecified: Secondary | ICD-10-CM

## 2024-06-16 DIAGNOSIS — Z7189 Other specified counseling: Secondary | ICD-10-CM

## 2024-06-16 MED ORDER — METRONIDAZOLE 0.75 % EX CREA: TOPICAL_CREAM | CUTANEOUS | 11 refills | Status: AC

## 2024-06-16 MED ORDER — HALOBETASOL PROPIONATE 0.05 % EX CREA: TOPICAL_CREAM | CUTANEOUS | 4 refills | Status: AC

## 2024-06-16 MED ORDER — DOXYCYCLINE HYCLATE 20 MG PO TABS: ORAL_TABLET | ORAL | 6 refills | Status: DC

## 2024-06-16 MED ORDER — DAPSONE 7.5 % EX GEL: CUTANEOUS | 6 refills | Status: AC

## 2024-06-16 NOTE — Progress Notes (Signed)
 Follow-Up Visit   Subjective  Jimmy Obrien is a 78 y.o. male who presents for the following: Skin Cancer Screening and Full Body Skin Exam Hx of aks, rosacea, perioral dermatitis, balanitis at groin and psoriasis at hands   Patient reports some spots at scalp , right ear wife noticed some redness, white spots at face, rash around nose , rough spot at left ankle   The patient presents for Total-Body Skin Exam (TBSE) for skin cancer screening and mole check. The patient has spots, moles and lesions to be evaluated, some may be new or changing and the patient may have concern these could be cancer.  The following portions of the chart were reviewed this encounter and updated as appropriate: medications, allergies, medical history  Review of Systems:  No other skin or systemic complaints except as noted in HPI or Assessment and Plan.  Objective  Well appearing patient in no apparent distress; mood and affect are within normal limits.  A full examination was performed including scalp, head, eyes, ears, nose, lips, neck, chest, axillae, abdomen, back, buttocks, bilateral upper extremities, bilateral lower extremities, hands, feet, fingers, toes, fingernails, and toenails. All findings within normal limits unless otherwise noted below.   Relevant physical exam findings are noted in the Assessment and Plan.  left cheek x 2 (2) Erythematous thin papules/macules with gritty scale.   Assessment & Plan   SKIN CANCER SCREENING PERFORMED TODAY.  ACTINIC DAMAGE - Chronic condition, secondary to cumulative UV/sun exposure - diffuse scaly erythematous macules with underlying dyspigmentation - Recommend daily broad spectrum sunscreen SPF 30+ to sun-exposed areas, reapply every 2 hours as needed.  - Staying in the shade or wearing long sleeves, sun glasses (UVA+UVB protection) and wide brim hats (4-inch brim around the entire circumference of the hat) are also recommended for sun protection.  -  Call for new or changing lesions.  LENTIGINES, SEBORRHEIC KERATOSES, HEMANGIOMAS - Benign normal skin lesions - Benign-appearing - Call for any changes  MELANOCYTIC NEVI - Tan-brown and/or pink-flesh-colored symmetric macules and papules - Benign appearing on exam today - Observation - Call clinic for new or changing moles - Recommend daily use of broad spectrum spf 30+ sunscreen to sun-exposed areas.   MILIA Exam: tiny erythematous firm white papule at face Discussed this is a type of cyst. Benign-appearing.    ROSACEA & Perioral Dermatitis Exam erythema face ; perinasal erythema  Chronic condition with duration or expected duration over one year. Currently well-controlled.  Rosacea is a chronic progressive skin condition usually affecting the face of adults, causing redness and/or acne bumps. It is treatable but not curable. It sometimes affects the eyes (ocular rosacea) as well. It may respond to topical and/or systemic medication and can flare with stress, sun exposure, alcohol , exercise, topical steroids (including hydrocortisone/cortisone 10) and some foods.  Daily application of broad spectrum spf 30+ sunscreen to face is recommended to reduce flares.   Patient denies grittiness of the eyes   Treatment Plan PRN flares continue  Metronidazole  cr at bedtime Topical Dapsone  prn PRN flares continue  Doxycycline  20mg  1 po qd with food and drink   Doxycycline  should be taken with food to prevent nausea. Do not lay down for 30 minutes after taking. Be cautious with sun exposure and use good sun protection while on this medication. Pregnant women should not take this medication.        PSORIASIS R arm, hands /palms Exam: arm and hand clear today  0% BSA. Chronic  condition with duration or expected duration over one year. Currently well-controlled.  patient denies joint pain Psoriasis is a chronic non-curable, but treatable genetic/hereditary disease that may have other systemic  features affecting other organ systems such as joints (Psoriatic Arthritis). It is associated with an increased risk of inflammatory bowel disease, heart disease, non-alcoholic fatty liver disease, and depression.  Treatments include light and laser treatments; topical medications; and systemic medications including oral and injectables.   Treatment Plan: Prn flares Continue Halobetasol  cr qd up to 5d/wk aa hand/arm until clear, then prn flares, avoid f/g/a   Topical steroids (such as triamcinolone , fluocinolone, fluocinonide, mometasone, clobetasol, halobetasol , betamethasone , hydrocortisone) can cause thinning and lightening of the skin if they are used for too long in the same area. Your physician has selected the right strength medicine for your problem and area affected on the body. Please use your medication only as directed by your physician to prevent side effects.     VITILIGO Exam: depigmented patches on arms  Vitiligo is a chronic autoimmune condition which causes loss of skin pigment and is commonly seen on the face and may also involve areas of trauma like hands, elbows, knees, and ankles. There is no cure and it is difficult to treat.  Treatments include topical steroids and other topical anti-inflammatory ointments/creams and topical and oral Jak inhibitors.  Sometimes narrow band UV light therapy or Xtrac laser is helpful, both of which require twice weekly treatments for at least 3-6 months.  Antioxidant vitamins, such as Vitamins A,C,E,D, Folic Acid and B12 may be added to enhance treatment. Heliocare may also enhance treatment results. 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  Pt says clear. Chronic condition with duration or expected duration over one year. Currently well-controlled.. Treatment Plan: Can restart Opzelura cream if flared, patient will call if he would like  rx  Start Opzelura cream qhs until clear, sample x 1,Lot [id number] exp 12/2023   PERIORAL DERMATITIS   Related Medications Dapsone  (ACZONE ) 7.5 % GEL Apply to face twice a day for perioral dermatitis at face ROSACEA   Related Medications metroNIDAZOLE  (METROCREAM ) 0.75 % cream Apply topically at bedtime. 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 They are chronic with expected duration over 1 year. A portion of actinic keratoses will progress to squamous cell carcinoma of the skin. It is not possible to reliably predict which spots will progress to skin cancer and so treatment is recommended to prevent development of skin cancer.  Recommend daily broad spectrum sunscreen SPF 30+ to sun-exposed areas, reapply every 2 hours as needed.  Recommend staying in the shade or wearing long sleeves, sun glasses (UVA+UVB protection) and wide brim hats (4-inch brim around the entire circumference of the hat). Call for new or changing lesions. Destruction of lesion - left cheek x 2 (2) Complexity: simple   Destruction method: cryotherapy   Informed consent: discussed and consent obtained   Timeout:  patient name, date of birth, surgical site, and procedure verified Lesion destroyed using liquid nitrogen: Yes   Region frozen until ice ball extended beyond lesion: Yes   Outcome: patient tolerated procedure well with no complications   Post-procedure details:  wound care instructions given    PSORIASIS   Related Medications halobetasol  (ULTRAVATE ) 0.05 % cream Apply topically as directed for 14 days, ONCE A DAY up to 5 days a week to AFFECTED AREAS OF psoriasis on hands AS NEEDED FOR psoriasis  flares. Avoid face, groin, axilla Return in about 1  year (around 06/16/2025) for TBSE.  IEleanor Blush, CMA, am acting as scribe for Alm Rhyme, MD.   Documentation: I have reviewed the above documentation for accuracy and completeness, and I agree with the above.  Alm Rhyme, MD

## 2024-06-16 NOTE — Progress Notes (Unsigned)
 06/17/2024 8:37 AM   [name] [birth year] [id number]  Referring provider: Rudolpho [name], MD [address] [facility] [address] [facility] [place],  [place] [address]  Urological history: 1. BPH with LU TS - PSA (09/2023) 0.04 - remote history of TURP - cysto (04/2024) TURP defect, mild to moderate trabeculations, and portion of bladder and inguinal hernia - silodosin  8 mg daily and dutasteride  0.5 mg daily  2. Nephrolithiasis - ESWL (2020) - CTU (02/2024) 3 mm right renal stone   No chief complaint on file.  HPI: VON QUINTANAR is a 78 y.o. man who presents today for three month follow up.    Previous records reviewed.   He has been having issues with urinary frequency and urgency.  He also was found to have incidental microscopic hematuria.  He underwent a hematuria workup recently with CT urogram and cystoscopy and no worrisome urological findings were discovered.  I PSS ***  He reports sensation of incomplete bladder emptying, urinary frequency, urinary intermittency, urinary urgency, a weak urinary stream, having to strain to void, nocturia x ***, leaking before being able to reach the restroom, leaking with coughing, leaking without awareness, and post void dribbling.     He is wearing *** pads//depends  daily.    Patient denies any modifying or aggravating factors.  Patient denies any recent UTI's, gross hematuria, dysuria or suprapubic/flank pain.  Patient denies any fevers, chills, nausea or vomiting.  ***  He has a family history of PCa, colon cancer, ovarian cancer and/or breast cancer with ***.   He does not have a family history of PCa, colon cancer, ovarian cancer, and/or breast cancer .***     UA***  PVR***  PSA (09/2023) 0.04  Serum creatinine (03/2024) 0.9, eGFR 86  Hemoglobin A1c (03/2024) 5.9  Diuretics:  ***  Fluid consumptiom: ***    PMH: Past Medical History:  Diagnosis Date   Actinic keratosis    Asthma    exercise induced   BPH (benign  prostatic hyperplasia)    Diverticulitis    Diverticulosis    Fatty liver    GERD (gastroesophageal reflux disease)    History of kidney stones    Hx of atrial fibrillation without current medication    Hypertension    Hyperthyroidism    Multinodular Goiter   Sleep apnea    No CPAP   Thrombocytopenia     Surgical History: Past Surgical History:  Procedure Laterality Date   BIOPSY  05/23/2023   Procedure: BIOPSY;  Surgeon: Aundria, Ladell POUR, MD;  Location: [facility] ENDOSCOPY;  Service: Gastroenterology;;   CATARACT EXTRACTION W/PHACO Right 07/13/2021   Procedure: CATARACT EXTRACTION PHACO AND INTRAOCULAR LENS PLACEMENT (IOC) RIGHT 2.92 00:50.2;  Surgeon: Mittie Gaskin, MD;  Location: [facility];  Service: Ophthalmology;  Laterality: Right;   CATARACT EXTRACTION W/PHACO Left 07/27/2021   Procedure: CATARACT EXTRACTION PHACO AND INTRAOCULAR LENS PLACEMENT (IOC) LEFT 3.67 00:39.7;  Surgeon: Mittie Gaskin, MD;  Location: [facility] SURGERY CNTR;  Service: Ophthalmology;  Laterality: Left;   CHOLECYSTECTOMY     COLON RESECTION     COLONOSCOPY WITH PROPOFOL  N/A 03/31/2019   Procedure: COLONOSCOPY WITH PROPOFOL ;  Surgeon: Toledo, Ladell POUR, MD;  Location: [facility] ENDOSCOPY;  Service: Endoscopy;  Laterality: N/A;   COLONOSCOPY WITH PROPOFOL  N/A 05/23/2023   Procedure: COLONOSCOPY WITH PROPOFOL ;  Surgeon: Toledo, Ladell POUR, MD;  Location: [facility] ENDOSCOPY;  Service: Gastroenterology;  Laterality: N/A;   ESOPHAGOGASTRODUODENOSCOPY N/A 05/23/2023   Procedure: ESOPHAGOGASTRODUODENOSCOPY (EGD);  Surgeon: Fountain,  Teodoro K, MD;  Location: [facility] ENDOSCOPY;  Service: Gastroenterology;  Laterality: N/A;   ESOPHAGOGASTRODUODENOSCOPY (EGD) WITH PROPOFOL  N/A 08/16/2016   Procedure: ESOPHAGOGASTRODUODENOSCOPY (EGD) WITH PROPOFOL ;  Surgeon: Lamar ONEIDA Holmes, MD;  Location: [facility] ENDOSCOPY;  Service: Endoscopy;  Laterality: N/A;   ESOPHAGOGASTRODUODENOSCOPY (EGD) WITH PROPOFOL  N/A 03/31/2019   Procedure:  ESOPHAGOGASTRODUODENOSCOPY (EGD) WITH PROPOFOL ;  Surgeon: Toledo, Ladell POUR, MD;  Location: [facility] ENDOSCOPY;  Service: Endoscopy;  Laterality: N/A;   EXTRACORPOREAL SHOCK WAVE LITHOTRIPSY Left 05/15/2019   Procedure: EXTRACORPOREAL SHOCK WAVE LITHOTRIPSY (ESWL);  Surgeon: Francisca Redell BROCKS, MD;  Location: [facility] ORS;  Service: Urology;  Laterality: Left;   POLYPECTOMY  05/23/2023   Procedure: POLYPECTOMY;  Surgeon: Toledo, Teodoro K, MD;  Location: [facility] ENDOSCOPY;  Service: Gastroenterology;;   TRANSURETHRAL RESECTION OF PROSTATE      Home Medications:  Allergies as of 06/17/2024       Reactions   Levofloxacin    Other Reaction(s): Other (See Comments) Increased neuropathy and tightness   Tetracycline Hcl    Other Reaction(s): inflamed genitals   Ciprofloxacin Other (See Comments), Rash   Joint aches Other Reaction(s): Abdominal Pain, Other (See Comments)   Other Rash   Other reaction(s): Unknown Inflammation   Penicillin G Sodium Rash   Penicillins Rash   Sulfa Antibiotics Rash   Sulfacetamide Sodium Rash   Tetracyclines & Related Other (See Comments)   Inflammation        Medication List        Accurate as of June 16, 2024  8:37 AM. If you have any questions, ask your nurse or doctor.          acetaminophen  650 MG CR tablet Commonly known as: TYLENOL  Take 1,300 mg by mouth 2 (two) times daily.   albuterol  108 (90 Base) MCG/ACT inhaler Commonly known as: VENTOLIN  HFA Inhale 2 puffs into the lungs every 6 (six) hours as needed for wheezing or shortness of breath.   amphetamine-dextroamphetamine 10 MG tablet Commonly known as: ADDERALL Take 10 mg by mouth daily with breakfast.   Arnuity Ellipta 200 MCG/ACT Aepb Generic drug: Fluticasone Furoate Inhale 1 puff into the lungs daily.   aspirin EC 81 MG tablet Take 81 mg by mouth daily.   atenolol 25 MG tablet Commonly known as: TENORMIN Take 12.5 mg by mouth daily.   azelastine 0.1 % nasal spray Commonly known  as: ASTELIN Place 1 spray into both nostrils 2 (two) times daily. Use in each nostril as directed   Breo Ellipta 100-25 MCG/ACT Aepb Generic drug: fluticasone furoate-vilanterol Inhale 1 puff into the lungs daily.   budesonide 32 MCG/ACT nasal spray Commonly known as: RHINOCORT AQUA Place 1 spray into both nostrils daily.   Cholecalciferol 250 MCG (10000 UT) Caps Take 2,000 Units by mouth 1 day or 1 dose.   cyanocobalamin 1000 MCG tablet Take 1,000 mcg by mouth in the morning and at bedtime.   Dapsone  7.5 % Gel Commonly known as: Aczone  Apply to face twice a day   diazepam  5 MG tablet Commonly known as: VALIUM  Take 5 mg by mouth 2 (two) times daily as needed.   dutasteride  0.5 MG capsule Commonly known as: AVODART  Take 1 capsule (0.5 mg total) by mouth daily.   EPINEPHrine  0.3 mg/0.3 mL Soaj injection Commonly known as: EPI-PEN Inject 0.3 mg into the muscle as needed.   famotidine 40 MG tablet Commonly known as: PEPCID Take 40 mg by mouth daily.   FLEXALL ULTRA PLUS EX Apply 1 application topically  as needed.   Flovent HFA 110 MCG/ACT inhaler Generic drug: fluticasone Inhale 2 puffs into the lungs.   gentamicin ointment 0.1 % Commonly known as: GARAMYCIN Apply 1 application topically 2 (two) times daily. In nose   halobetasol  0.05 % cream Commonly known as: ULTRAVATE  Apply topically as directed for 14 days, ONCE A DAY up to 5 days a week to AFFECTED AREAS OF psoriasis on hands AS NEEDED FOR flares. Avoid face, groin, axilla   ipratropium 0.06 % nasal spray Commonly known as: ATROVENT Place into the nose.   methimazole 5 MG tablet Commonly known as: TAPAZOLE Take 5 mg by mouth daily.   metroNIDAZOLE  0.75 % cream Commonly known as: METROCREAM  Apply topically at bedtime. TO FACE FOR ROSACEA   montelukast 10 MG tablet Commonly known as: SINGULAIR Take 10 mg by mouth daily.   multivitamin tablet Take 1 tablet by mouth daily.   pantoprazole 40 MG  tablet Commonly known as: PROTONIX Take 40 mg by mouth 2 (two) times daily.   polyethylene glycol 17 g packet Commonly known as: MiraLax  Take 17 g by mouth daily.   senna-docusate 8.6-50 MG tablet Commonly known as: Senokot-S Take 2 tablets by mouth 2 (two) times daily.   silodosin  8 MG Caps capsule Commonly known as: RAPAFLO  Take 1 capsule (8 mg total) by mouth daily with breakfast.   Simethicone 180 MG Caps Take 125 mg by mouth every 6 (six) hours as needed for flatulence. (Phazyme)   Vitamin A 2400 MCG (8000 UT) Tabs Take by mouth daily.   vitamin C 1000 MG tablet Take 1,000 mg by mouth 2 (two) times daily.   zinc gluconate 50 MG tablet Take 50 mg by mouth daily.        Allergies:  Allergies  Allergen Reactions   Levofloxacin     Other Reaction(s): Other (See Comments)  Increased neuropathy and tightness   Tetracycline Hcl     Other Reaction(s): inflamed genitals   Ciprofloxacin Other (See Comments) and Rash    Joint aches  Other Reaction(s): Abdominal Pain, Other (See Comments)   Other Rash    Other reaction(s): Unknown Inflammation   Penicillin G Sodium Rash   Penicillins Rash   Sulfa Antibiotics Rash   Sulfacetamide Sodium Rash   Tetracyclines & Related Other (See Comments)    Inflammation     Family History: No family history on file.  Social History:  reports that he has never smoked. He has never used smokeless tobacco. He reports current alcohol  use. He reports that he does not use drugs.  ROS: Pertinent ROS in HPI  Physical Exam: There were no vitals taken for this visit.  Constitutional:  Well nourished. Alert and oriented, No acute distress. HEENT: [place] AT, moist mucus membranes.  Trachea midline, no masses. Cardiovascular: No clubbing, cyanosis, or edema. Respiratory: Normal respiratory effort, no increased work of breathing. GI: Abdomen is soft, non tender, non distended, no abdominal masses. Liver and spleen not palpable.  No  hernias appreciated.  Stool sample for occult testing is not indicated.   GU: No CVA tenderness.  No bladder fullness or masses.  Patient with circumcised/uncircumcised phallus. ***Foreskin easily retracted***  Urethral meatus is patent.  No penile discharge. No penile lesions or rashes. Scrotum without lesions, cysts, rashes and/or edema.  Testicles are located scrotally bilaterally. No masses are appreciated in the testicles. Left and right epididymis are normal. Rectal: Patient with  normal sphincter tone. Anus and perineum without scarring or rashes. No rectal  masses are appreciated. Prostate is approximately *** grams, *** nodules are appreciated. Seminal vesicles are normal. Skin: No rashes, bruises or suspicious lesions. Lymph: No cervical or inguinal adenopathy. Neurologic: Grossly intact, no focal deficits, moving all 4 extremities. Psychiatric: Normal mood and affect.   Laboratory Data: See Epic and HPI  I have reviewed the labs.   Pertinent Imaging: ***    Assessment & Plan:    1. High risk hematuria - recent hematuria work up with no worrisome urological findings - No reports of gross heme - Continue to monitor for worsening of hematuria and/or new symptomatology  2. BPH with LU TS - stable, improving, worsening mild, moderate severe symptoms *** - no signs of retention, infection or malignancy *** - PSA up to date *** - PVR < 300 cc *** - most bothersome symptoms are *** - encouraged avoiding bladder irritants, fluid restriction before bedtime and timed voiding's - Initiate alpha-blocker (***), discussed side effects *** - Initiate 5 alpha reductase inhibitor (***), discussed side effects *** - Continue Rapaflo  8 mg daily and dutasteride  0.5 mg daily***:refills given - educated on red flag symptoms: acute retention, gross hematuria, fever, severe pain - advised to call clinic or go to the ED if these occur - return to clinic in *** symptom re-evaluation ***   No  follow-ups on file.  These notes generated with voice recognition software. I apologize for typographical errors.  CLOTILDA HELON RIGGERS  [facility] [address]  [address] [place], [place] [address] [phone]

## 2024-06-16 NOTE — Patient Instructions (Signed)

## 2024-06-17 ENCOUNTER — Encounter: Payer: Self-pay | Admitting: Urology

## 2024-06-17 ENCOUNTER — Ambulatory Visit: Admitting: Urology

## 2024-06-17 VITALS — BP 120/78 | HR 80 | Ht 66.0 in | Wt 170.0 lb

## 2024-06-17 DIAGNOSIS — R319 Hematuria, unspecified: Secondary | ICD-10-CM

## 2024-06-17 DIAGNOSIS — N138 Other obstructive and reflux uropathy: Secondary | ICD-10-CM

## 2024-06-17 DIAGNOSIS — N401 Enlarged prostate with lower urinary tract symptoms: Secondary | ICD-10-CM

## 2024-06-17 LAB — BLADDER SCAN AMB NON-IMAGING

## 2024-06-17 MED ORDER — DUTASTERIDE 0.5 MG PO CAPS
0.5000 mg | ORAL_CAPSULE | Freq: Every day | ORAL | 3 refills | Status: AC
Start: 2024-06-17 — End: ?

## 2024-06-24 ENCOUNTER — Telehealth: Payer: Self-pay

## 2024-06-24 DIAGNOSIS — M9903 Segmental and somatic dysfunction of lumbar region: Secondary | ICD-10-CM | POA: Diagnosis not present

## 2024-06-24 DIAGNOSIS — R519 Headache, unspecified: Secondary | ICD-10-CM | POA: Diagnosis not present

## 2024-06-24 DIAGNOSIS — M542 Cervicalgia: Secondary | ICD-10-CM | POA: Diagnosis not present

## 2024-06-24 DIAGNOSIS — N401 Enlarged prostate with lower urinary tract symptoms: Secondary | ICD-10-CM

## 2024-06-24 DIAGNOSIS — N138 Other obstructive and reflux uropathy: Secondary | ICD-10-CM

## 2024-06-24 DIAGNOSIS — M9901 Segmental and somatic dysfunction of cervical region: Secondary | ICD-10-CM | POA: Diagnosis not present

## 2024-06-24 NOTE — Progress Notes (Unsigned)
 06/25/2024 8:38 PM   Jimmy Obrien [birth year] [id number]  Referring provider: Rudolpho Jimmy BIRCH, MD [address] [facility] [address] [facility] [place],  [place] [address]  Urological history: 1. BPH with LU TS - PSA (09/2023) 0.04 - remote history of TURP - cysto (04/2024) TURP defect, mild to moderate trabeculations, and portion of bladder and inguinal hernia - silodosin  8 mg daily and dutasteride  0.5 mg daily  2. Nephrolithiasis - ESWL (2020) - CTU (02/2024) 3 mm right renal stone   No chief complaint on file.  HPI: Jimmy Obrien is a 78 y.o. man who presents today for burning with urination at the tip of the penis.  Previous records reviewed.   UA ***   PMH: Past Medical History:  Diagnosis Date   Actinic keratosis    Asthma    exercise induced   BPH (benign prostatic hyperplasia)    Diverticulitis    Diverticulosis    Fatty liver    GERD (gastroesophageal reflux disease)    History of kidney stones    Hx of atrial fibrillation without current medication    Hypertension    Hyperthyroidism    Multinodular Goiter   Sleep apnea    No CPAP   Thrombocytopenia     Surgical History: Past Surgical History:  Procedure Laterality Date   BIOPSY  05/23/2023   Procedure: BIOPSY;  Surgeon: Aundria, Ladell POUR, MD;  Location: [facility] ENDOSCOPY;  Service: Gastroenterology;;   CATARACT EXTRACTION W/PHACO Right 07/13/2021   Procedure: CATARACT EXTRACTION PHACO AND INTRAOCULAR LENS PLACEMENT (IOC) RIGHT 2.92 00:50.2;  Surgeon: Mittie Gaskin, MD;  Location: [facility];  Service: Ophthalmology;  Laterality: Right;   CATARACT EXTRACTION W/PHACO Left 07/27/2021   Procedure: CATARACT EXTRACTION PHACO AND INTRAOCULAR LENS PLACEMENT (IOC) LEFT 3.67 00:39.7;  Surgeon: Mittie Gaskin, MD;  Location: [facility];  Service: Ophthalmology;  Laterality: Left;   CHOLECYSTECTOMY     COLON RESECTION     COLONOSCOPY WITH PROPOFOL  N/A 03/31/2019   Procedure: COLONOSCOPY  WITH PROPOFOL ;  Surgeon: Toledo, Ladell POUR, MD;  Location: [facility] ENDOSCOPY;  Service: Endoscopy;  Laterality: N/A;   COLONOSCOPY WITH PROPOFOL  N/A 05/23/2023   Procedure: COLONOSCOPY WITH PROPOFOL ;  Surgeon: Toledo, Ladell POUR, MD;  Location: [facility] ENDOSCOPY;  Service: Gastroenterology;  Laterality: N/A;   ESOPHAGOGASTRODUODENOSCOPY N/A 05/23/2023   Procedure: ESOPHAGOGASTRODUODENOSCOPY (EGD);  Surgeon: Toledo, Ladell POUR, MD;  Location: [facility] ENDOSCOPY;  Service: Gastroenterology;  Laterality: N/A;   ESOPHAGOGASTRODUODENOSCOPY (EGD) WITH PROPOFOL  N/A 08/16/2016   Procedure: ESOPHAGOGASTRODUODENOSCOPY (EGD) WITH PROPOFOL ;  Surgeon: Lamar ONEIDA Holmes, MD;  Location: [facility] ENDOSCOPY;  Service: Endoscopy;  Laterality: N/A;   ESOPHAGOGASTRODUODENOSCOPY (EGD) WITH PROPOFOL  N/A 03/31/2019   Procedure: ESOPHAGOGASTRODUODENOSCOPY (EGD) WITH PROPOFOL ;  Surgeon: Toledo, Ladell POUR, MD;  Location: [facility] ENDOSCOPY;  Service: Endoscopy;  Laterality: N/A;   EXTRACORPOREAL SHOCK WAVE LITHOTRIPSY Left 05/15/2019   Procedure: EXTRACORPOREAL SHOCK WAVE LITHOTRIPSY (ESWL);  Surgeon: Francisca Redell BROCKS, MD;  Location: [facility] ORS;  Service: Urology;  Laterality: Left;   POLYPECTOMY  05/23/2023   Procedure: POLYPECTOMY;  Surgeon: Toledo, Teodoro K, MD;  Location: [facility] ENDOSCOPY;  Service: Gastroenterology;;   TRANSURETHRAL RESECTION OF PROSTATE      Home Medications:  Allergies as of 06/25/2024       Reactions   Levofloxacin    Other Reaction(s): Other (See Comments) Increased neuropathy and tightness   Tetracycline Hcl    Other Reaction(s): inflamed genitals   Ciprofloxacin Other (See Comments), Rash   Joint aches Other Reaction(s): Abdominal  Pain, Other (See Comments)   Other Rash   Other reaction(s): Unknown Inflammation   Penicillin G Sodium Rash   Penicillins Rash   Sulfa Antibiotics Rash   Sulfacetamide Sodium Rash   Tetracyclines & Related Other (See Comments)   Inflammation        Medication List         Accurate as of June 24, 2024  8:38 PM. If you have any questions, ask your nurse or doctor.          acetaminophen  650 MG CR tablet Commonly known as: TYLENOL  Take 1,300 mg by mouth 2 (two) times daily.   albuterol  108 (90 Base) MCG/ACT inhaler Commonly known as: VENTOLIN  HFA Inhale 2 puffs into the lungs every 6 (six) hours as needed for wheezing or shortness of breath.   amphetamine-dextroamphetamine 10 MG 24 hr capsule Commonly known as: ADDERALL XR Take 10 mg by mouth.   Arnuity Ellipta 200 MCG/ACT Aepb Generic drug: Fluticasone Furoate Inhale 1 puff into the lungs daily.   aspirin EC 81 MG tablet Take 81 mg by mouth daily.   atenolol 25 MG tablet Commonly known as: TENORMIN Take 12.5 mg by mouth daily.   azelastine 0.1 % nasal spray Commonly known as: ASTELIN Place 1 spray into both nostrils 2 (two) times daily. Use in each nostril as directed   Breo Ellipta 100-25 MCG/ACT Aepb Generic drug: fluticasone furoate-vilanterol Inhale 1 puff into the lungs daily.   budesonide 32 MCG/ACT nasal spray Commonly known as: RHINOCORT AQUA Place 1 spray into both nostrils daily.   cefdinir 300 MG capsule Commonly known as: OMNICEF Take by mouth.   Cholecalciferol 250 MCG (10000 UT) Caps Take 2,000 Units by mouth 1 day or 1 dose.   clindamycin 300 MG capsule Commonly known as: CLEOCIN Take 300 mg by mouth 3 (three) times daily.   cyanocobalamin 1000 MCG tablet Take 1,000 mcg by mouth in the morning and at bedtime.   Dapsone  7.5 % Gel Commonly known as: Aczone  Apply to face twice a day for perioral dermatitis at face   diazepam  5 MG tablet Commonly known as: VALIUM  Take 5 mg by mouth 2 (two) times daily as needed.   doxycycline  20 MG tablet Commonly known as: PERIOSTAT  Take 1 by mouth daily as needed for rosacea flares take with food and drink   dutasteride  0.5 MG capsule Commonly known as: AVODART  Take 1 capsule (0.5 mg total) by mouth daily.    EPINEPHrine  0.3 mg/0.3 mL Soaj injection Commonly known as: EPI-PEN Inject 0.3 mg into the muscle as needed.   famotidine 40 MG tablet Commonly known as: PEPCID Take 40 mg by mouth daily.   FLEXALL ULTRA PLUS EX Apply 1 application topically as needed.   Flovent HFA 110 MCG/ACT inhaler Generic drug: fluticasone Inhale 2 puffs into the lungs.   gentamicin ointment 0.1 % Commonly known as: GARAMYCIN Apply 1 application topically 2 (two) times daily. In nose   halobetasol  0.05 % cream Commonly known as: ULTRAVATE  Apply topically as directed for 14 days, ONCE A DAY up to 5 days a week to AFFECTED AREAS OF psoriasis on hands AS NEEDED FOR psoriasis  flares. Avoid face, groin, axilla   ipratropium 0.06 % nasal spray Commonly known as: ATROVENT Place into the nose.   methimazole 5 MG tablet Commonly known as: TAPAZOLE Take 5 mg by mouth daily.   metroNIDAZOLE  0.75 % cream Commonly known as: METROCREAM  Apply topically at bedtime. TO FACE FOR ROSACEA  montelukast 10 MG tablet Commonly known as: SINGULAIR Take 10 mg by mouth daily.   multivitamin tablet Take 1 tablet by mouth daily.   mupirocin ointment 2 % Commonly known as: BACTROBAN   pantoprazole 40 MG tablet Commonly known as: PROTONIX Take 40 mg by mouth 2 (two) times daily.   polyethylene glycol 17 g packet Commonly known as: MiraLax  Take 17 g by mouth daily.   predniSONE 10 MG tablet Commonly known as: DELTASONE Take 10 mg by mouth 2 (two) times daily.   senna-docusate 8.6-50 MG tablet Commonly known as: Senokot-S Take 2 tablets by mouth 2 (two) times daily.   silodosin  8 MG Caps capsule Commonly known as: RAPAFLO  Take 1 capsule (8 mg total) by mouth daily with breakfast.   Simethicone 180 MG Caps Take 125 mg by mouth every 6 (six) hours as needed for flatulence. (Phazyme)   Vitamin A 2400 MCG (8000 UT) Tabs Take by mouth daily.   vitamin C 1000 MG tablet Take 1,000 mg by mouth 2 (two) times  daily.   zinc gluconate 50 MG tablet Take 50 mg by mouth daily.        Allergies:  Allergies  Allergen Reactions   Levofloxacin     Other Reaction(s): Other (See Comments)  Increased neuropathy and tightness   Tetracycline Hcl     Other Reaction(s): inflamed genitals   Ciprofloxacin Other (See Comments) and Rash    Joint aches  Other Reaction(s): Abdominal Pain, Other (See Comments)   Other Rash    Other reaction(s): Unknown Inflammation   Penicillin G Sodium Rash   Penicillins Rash   Sulfa Antibiotics Rash   Sulfacetamide Sodium Rash   Tetracyclines & Related Other (See Comments)    Inflammation     Family History: No family history on file.  Social History:  reports that he has never smoked. He has never used smokeless tobacco. He reports current alcohol  use. He reports that he does not use drugs.  ROS: Pertinent ROS in HPI  Physical Exam: There were no vitals taken for this visit.  Constitutional:  Well nourished. Alert and oriented, No acute distress. HEENT: [place] AT, moist mucus membranes.  Trachea midline, no masses. Cardiovascular: No clubbing, cyanosis, or edema. Respiratory: Normal respiratory effort, no increased work of breathing. GU: No CVA tenderness.  No bladder fullness or masses.  Recession of labia minora, dry, pale vulvar vaginal mucosa and loss of mucosal ridges and folds.  Normal urethral meatus, no lesions, no prolapse, no discharge.   No urethral masses, tenderness and/or tenderness. No bladder fullness, tenderness or masses. *** vagina mucosa, *** estrogen effect, no discharge, no lesions, *** pelvic support, *** cystocele and *** rectocele noted.  No cervical motion tenderness.  Uterus is freely mobile and non-fixed.  No adnexal/parametria masses or tenderness noted.  Anus and perineum are without rashes or lesions.   ***  Neurologic: Grossly intact, no focal deficits, moving all 4 extremities. Psychiatric: Normal mood and affect.    Laboratory  Data: See Epic and HPI  I have reviewed the labs.   Pertinent Imaging: N/A   Assessment & Plan:    1. Suspected UTI  - UA grossly infected  - Urine culture pending - Started empirically on ***, will adjust if necessary once urine culture and sensitivity results are available  - Advised patient to increase fluid intake and monitor symptoms - Counseled on UTI prevention (hydration, post-coital voiding, wiping from to back) *** - follow-up or call if no improvement  within 48-72 hours or if symptoms worsen (fever, back pain)  -Ceftin 500 mg twice daily for seven days *** -Ceftin 250 mg twice daily for seven days *** -Septra DS twice daily for seven days *** -Augmentin 875/125 twice daily for seven days *** -Macrobid  100 mg twice daily for seven days *** -Doxycycline  100 mg twice daily for seven days *** -Omnicef 300 mg twice daily for seven days ***    2. High risk hematuria - recent hematuria work up with no worrisome urological findings - continue to monitor   3. BPH with LU TS - Continue Rapaflo  8 mg daily and dutasteride  0.5 mg daily:refills given ***  No follow-ups on file.  These notes generated with voice recognition software. I apologize for typographical errors.  CLOTILDA HELON RIGGERS  [facility] [address]  [address] [place], [place] [address] [phone]

## 2024-06-24 NOTE — Telephone Encounter (Signed)
 Pt called in with c/o Burning with urination at tip of penis, bladder and prostate he thinks. Pt states his pain might be in his bladder. He states no fevers, some increased urination. I scheduled and office visit tomorrow. Pt voiced understanding.

## 2024-06-25 ENCOUNTER — Ambulatory Visit
Admission: RE | Admit: 2024-06-25 | Discharge: 2024-06-25 | Disposition: A | Source: Ambulatory Visit | Attending: Urology | Admitting: Urology

## 2024-06-25 ENCOUNTER — Ambulatory Visit: Admitting: Urology

## 2024-06-25 ENCOUNTER — Ambulatory Visit
Admission: RE | Admit: 2024-06-25 | Discharge: 2024-06-25 | Disposition: A | Discharge status: Home / Self Care | Source: Ambulatory Visit | Attending: Urology | Admitting: Urology

## 2024-06-25 ENCOUNTER — Telehealth: Payer: Self-pay | Admitting: Urology

## 2024-06-25 ENCOUNTER — Encounter: Payer: Self-pay | Admitting: Urology

## 2024-06-25 VITALS — BP 121/78 | HR 57 | Ht 66.0 in | Wt 170.0 lb

## 2024-06-25 DIAGNOSIS — R319 Hematuria, unspecified: Secondary | ICD-10-CM

## 2024-06-25 DIAGNOSIS — N401 Enlarged prostate with lower urinary tract symptoms: Secondary | ICD-10-CM

## 2024-06-25 DIAGNOSIS — N200 Calculus of kidney: Secondary | ICD-10-CM

## 2024-06-25 DIAGNOSIS — R3989 Other symptoms and signs involving the genitourinary system: Secondary | ICD-10-CM

## 2024-06-25 DIAGNOSIS — R3914 Feeling of incomplete bladder emptying: Secondary | ICD-10-CM

## 2024-06-25 DIAGNOSIS — R10A0 Flank pain, unspecified side: Secondary | ICD-10-CM | POA: Diagnosis not present

## 2024-06-25 LAB — MICROSCOPIC EXAMINATION

## 2024-06-25 LAB — URINALYSIS, COMPLETE
Bilirubin, UA: NEGATIVE
Glucose, UA: NEGATIVE
Ketones, UA: NEGATIVE
Nitrite, UA: NEGATIVE
Protein,UA: NEGATIVE
Specific Gravity, UA: 1.01 (ref 1.005–1.030)
Urobilinogen, Ur: 0.2 mg/dL (ref 0.2–1.0)
pH, UA: 6 (ref 5.0–7.5)

## 2024-06-25 MED ORDER — CEFUROXIME AXETIL 250 MG PO TABS: 250.0000 mg | ORAL_TABLET | Freq: Two times a day (BID) | ORAL | 0 refills | Status: AC

## 2024-06-25 NOTE — Telephone Encounter (Signed)
Called patient and patient understood

## 2024-06-25 NOTE — Telephone Encounter (Signed)
 Please let Jimmy Obrien know that his xray did not show any ureteral stones to explain his symptoms.

## 2024-06-27 DIAGNOSIS — J301 Allergic rhinitis due to pollen: Secondary | ICD-10-CM | POA: Diagnosis not present

## 2024-07-01 ENCOUNTER — Ambulatory Visit: Payer: Self-pay | Admitting: Urology

## 2024-07-01 LAB — CULTURE, URINE COMPREHENSIVE

## 2024-07-04 DIAGNOSIS — J301 Allergic rhinitis due to pollen: Secondary | ICD-10-CM | POA: Diagnosis not present

## 2024-07-08 ENCOUNTER — Other Ambulatory Visit
Admission: RE | Admit: 2024-07-08 | Discharge: 2024-07-08 | Disposition: A | Discharge status: Home / Self Care | Attending: Urology | Admitting: Urology

## 2024-07-08 ENCOUNTER — Ambulatory Visit: Admitting: Urology

## 2024-07-08 VITALS — BP 122/74 | HR 71 | Ht 66.0 in | Wt 170.0 lb

## 2024-07-08 DIAGNOSIS — R3914 Feeling of incomplete bladder emptying: Secondary | ICD-10-CM | POA: Insufficient documentation

## 2024-07-08 DIAGNOSIS — N401 Enlarged prostate with lower urinary tract symptoms: Secondary | ICD-10-CM | POA: Diagnosis not present

## 2024-07-08 DIAGNOSIS — N200 Calculus of kidney: Secondary | ICD-10-CM

## 2024-07-08 DIAGNOSIS — Z87440 Personal history of urinary (tract) infections: Secondary | ICD-10-CM | POA: Insufficient documentation

## 2024-07-08 LAB — URINALYSIS, COMPLETE (UACMP) WITH MICROSCOPIC
Bacteria, UA: NONE SEEN
Bilirubin Urine: NEGATIVE
Glucose, UA: NEGATIVE mg/dL
Hgb urine dipstick: NEGATIVE
Ketones, ur: NEGATIVE mg/dL
Leukocytes,Ua: NEGATIVE
Nitrite: NEGATIVE
Protein, ur: NEGATIVE mg/dL
RBC / HPF: NONE SEEN RBC/hpf (ref 0–5)
Specific Gravity, Urine: 1.01 (ref 1.005–1.030)
Squamous Epithelial / HPF: NONE SEEN /HPF (ref 0–5)
WBC, UA: NONE SEEN WBC/hpf (ref 0–5)
pH: 6.5 (ref 5.0–8.0)

## 2024-07-08 LAB — BLADDER SCAN AMB NON-IMAGING

## 2024-07-08 NOTE — Progress Notes (Signed)
   07/08/2024 7:54 PM   Jimmy Obrien 1946/07/25 [id number]  Reason for visit: Follow up urinary symptoms, bladder hernia, PSA screening, nephrolithiasis  History: Previously followed by Dr. Gala, distant history of TURP Persistent urinary symptoms despite maximal medical therapy with Flomax  and dutasteride , recent cystoscopy with wide open prostatic fossa, PVRs normal CT February 2024 showing inguinal hernia containing large portion of the bladder  Physical Exam: BP 122/74 (BP Location: Left Arm, Patient Position: Sitting, Cuff Size: Large)   Pulse 71   Ht 5' 6 (1.676 m)   Wt 170 lb (77.1 kg)   SpO2 98%   BMI 27.44 kg/m   Imaging/labs: Urine culture 10/15 no growth PSA January 2025 0.04, corrected for finasteride 0.08 I personally viewed and interpreted the CT scan from February 2024 showing large portion of the bladder within the left inguinal hernia Urinalysis today benign  Today: He has passed multiple stones over the last few weeks, denies any gross hematuria or pain today Primary complaint is sensation of incomplete emptying, PVR today normal at 50ml  Plan:   Urinary symptoms: Discussed his persistent urinary symptoms are likely related to portion of the bladder contained within the inguinal hernia leading to incomplete emptying, he has previously seen general surgery and deferred intervention.  He is willing to reconsider at this time.  I do not think we have any other alternatives medication wise or surgically from a urologic perspective to improve his voiding, anticipate he would see significant improvement after hernia repair Nephrolithiasis: Urinalysis today benign, prevention strategies reviewed PSA screening: No further screening needed based on age, PSA extremely low Recommended following up with general surgery to discuss bladder containing inguinal hernia, urology can be available IntraOp if needed   Jimmy JAYSON Burnet, MD  [facility] [address], [address] [place], [place] [address] [phone]

## 2024-07-09 ENCOUNTER — Ambulatory Visit: Payer: Self-pay | Admitting: Urology

## 2024-07-11 DIAGNOSIS — J301 Allergic rhinitis due to pollen: Secondary | ICD-10-CM | POA: Diagnosis not present

## 2024-07-18 DIAGNOSIS — J301 Allergic rhinitis due to pollen: Secondary | ICD-10-CM | POA: Diagnosis not present

## 2024-07-22 DIAGNOSIS — M9903 Segmental and somatic dysfunction of lumbar region: Secondary | ICD-10-CM | POA: Diagnosis not present

## 2024-07-22 DIAGNOSIS — M542 Cervicalgia: Secondary | ICD-10-CM | POA: Diagnosis not present

## 2024-07-22 DIAGNOSIS — R519 Headache, unspecified: Secondary | ICD-10-CM | POA: Diagnosis not present

## 2024-07-22 DIAGNOSIS — M9901 Segmental and somatic dysfunction of cervical region: Secondary | ICD-10-CM | POA: Diagnosis not present

## 2024-07-28 DIAGNOSIS — J301 Allergic rhinitis due to pollen: Secondary | ICD-10-CM | POA: Diagnosis not present

## 2024-08-01 DIAGNOSIS — J301 Allergic rhinitis due to pollen: Secondary | ICD-10-CM | POA: Diagnosis not present

## 2024-08-15 DIAGNOSIS — J301 Allergic rhinitis due to pollen: Secondary | ICD-10-CM | POA: Diagnosis not present

## 2024-08-19 DIAGNOSIS — R519 Headache, unspecified: Secondary | ICD-10-CM | POA: Diagnosis not present

## 2024-08-19 DIAGNOSIS — M542 Cervicalgia: Secondary | ICD-10-CM | POA: Diagnosis not present

## 2024-08-19 DIAGNOSIS — M9903 Segmental and somatic dysfunction of lumbar region: Secondary | ICD-10-CM | POA: Diagnosis not present

## 2024-08-19 DIAGNOSIS — M9901 Segmental and somatic dysfunction of cervical region: Secondary | ICD-10-CM | POA: Diagnosis not present

## 2024-08-19 NOTE — Progress Notes (Signed)
 08/26/2024 10:11 AM   Jimmy Obrien 1946/05/15 [id number]  Referring provider: Rudolpho Jimmy BIRCH, MD [address] [facility] [place],  [place] [address]  Urological history: 1. BPH with LU TS - PSA (09/2023) 0.04 - remote history of TURP - cysto (04/2024) TURP defect, mild to moderate trabeculations, and portion of bladder and inguinal hernia - silodosin  8 mg daily and dutasteride  0.5 mg daily   2. Nephrolithiasis - ESWL (2020) - CTU (02/2024) 3 mm right renal stone   Chief Complaint  Patient presents with   Follow-up   Urinary Tract Infection   HPI: Jimmy Obrien is a 78 y.o. man who presents today for follow up.   Previous records reviewed.  He continues to have urinary frequency and urgency and constipation causes his bladder to bulge.  He is having his hernia repaired after the holidays.  Patient denies any modifying or aggravating factors.  Patient denies any recent UTI's, gross hematuria, dysuria or suprapubic/flank pain.  Patient denies any fevers, chills, nausea or vomiting.    UA yellow clear, specific gravity less than 1.005, pH 6.0, 0-5 WBCs, no RBCs, 0-10 epithelial cells.   PMH: Past Medical History:  Diagnosis Date   Actinic keratosis    Asthma    exercise induced   BPH (benign prostatic hyperplasia)    Diverticulitis    Diverticulosis    Fatty liver    GERD (gastroesophageal reflux disease)    History of kidney stones    Hx of atrial fibrillation without current medication    Hypertension    Hyperthyroidism    Multinodular Goiter   Sleep apnea    No CPAP   Thrombocytopenia     Surgical History: Past Surgical History:  Procedure Laterality Date   BIOPSY  05/23/2023   Procedure: BIOPSY;  Surgeon: Aundria, Ladell POUR, MD;  Location: [facility] ENDOSCOPY;  Service: Gastroenterology;;   CATARACT EXTRACTION W/PHACO Right 07/13/2021   Procedure: CATARACT EXTRACTION PHACO AND INTRAOCULAR LENS PLACEMENT (IOC) RIGHT 2.92 00:50.2;  Surgeon:  Mittie Gaskin, MD;  Location: [facility] SURGERY [facility];  Service: Ophthalmology;  Laterality: Right;   CATARACT EXTRACTION W/PHACO Left 07/27/2021   Procedure: CATARACT EXTRACTION PHACO AND INTRAOCULAR LENS PLACEMENT (IOC) LEFT 3.67 00:39.7;  Surgeon: Mittie Gaskin, MD;  Location: [facility] SURGERY [facility];  Service: Ophthalmology;  Laterality: Left;   CHOLECYSTECTOMY     COLON RESECTION     COLONOSCOPY WITH PROPOFOL  N/A 03/31/2019   Procedure: COLONOSCOPY WITH PROPOFOL ;  Surgeon: Toledo, Ladell POUR, MD;  Location: [facility] ENDOSCOPY;  Service: Endoscopy;  Laterality: N/A;   COLONOSCOPY WITH PROPOFOL  N/A 05/23/2023   Procedure: COLONOSCOPY WITH PROPOFOL ;  Surgeon: Toledo, Ladell POUR, MD;  Location: [facility] ENDOSCOPY;  Service: Gastroenterology;  Laterality: N/A;   ESOPHAGOGASTRODUODENOSCOPY N/A 05/23/2023   Procedure: ESOPHAGOGASTRODUODENOSCOPY (EGD);  Surgeon: Toledo, Ladell POUR, MD;  Location: [facility] ENDOSCOPY;  Service: Gastroenterology;  Laterality: N/A;   ESOPHAGOGASTRODUODENOSCOPY (EGD) WITH PROPOFOL  N/A 08/16/2016   Procedure: ESOPHAGOGASTRODUODENOSCOPY (EGD) WITH PROPOFOL ;  Surgeon: Lamar ONEIDA Holmes, MD;  Location: [facility] ENDOSCOPY;  Service: Endoscopy;  Laterality: N/A;   ESOPHAGOGASTRODUODENOSCOPY (EGD) WITH PROPOFOL  N/A 03/31/2019   Procedure: ESOPHAGOGASTRODUODENOSCOPY (EGD) WITH PROPOFOL ;  Surgeon: Toledo, Ladell POUR, MD;  Location: [facility] ENDOSCOPY;  Service: Endoscopy;  Laterality: N/A;   EXTRACORPOREAL SHOCK WAVE LITHOTRIPSY Left 05/15/2019   Procedure: EXTRACORPOREAL SHOCK WAVE LITHOTRIPSY (ESWL);  Surgeon: Francisca Redell BROCKS, MD;  Location: [facility] ORS;  Service: Urology;  Laterality: Left;   POLYPECTOMY  05/23/2023   Procedure: POLYPECTOMY;  Surgeon:  Toledo, Teodoro K, MD;  Location: [facility] ENDOSCOPY;  Service: Gastroenterology;;   TRANSURETHRAL RESECTION OF PROSTATE      Home Medications:  Allergies as of 08/26/2024       Reactions   Levofloxacin    Other Reaction(s): Other (See  Comments) Increased neuropathy and tightness   Tetracycline Hcl    Other Reaction(s): inflamed genitals   Ciprofloxacin Other (See Comments), Rash   Joint aches Other Reaction(s): Abdominal Pain, Other (See Comments)   Other Rash   Other reaction(s): Unknown Inflammation   Penicillin G Sodium Rash   Penicillins Rash   Sulfa Antibiotics Rash   Sulfacetamide Sodium Rash   Tetracyclines & Related Other (See Comments)   Inflammation        Medication List        Accurate as of August 26, 2024 10:11 AM. If you have any questions, ask your nurse or doctor.          acetaminophen  650 MG CR tablet Commonly known as: TYLENOL  Take 1,300 mg by mouth 2 (two) times daily.   albuterol  108 (90 Base) MCG/ACT inhaler Commonly known as: VENTOLIN  HFA Inhale 2 puffs into the lungs every 6 (six) hours as needed for wheezing or shortness of breath.   amphetamine-dextroamphetamine 10 MG 24 hr capsule Commonly known as: ADDERALL XR Take 10 mg by mouth.   Arnuity Ellipta 200 MCG/ACT Aepb Generic drug: Fluticasone Furoate Inhale 1 puff into the lungs daily.   aspirin EC 81 MG tablet Take 81 mg by mouth daily.   atenolol 25 MG tablet Commonly known as: TENORMIN Take 12.5 mg by mouth daily.   azelastine 0.1 % nasal spray Commonly known as: ASTELIN Place 1 spray into both nostrils 2 (two) times daily. Use in each nostril as directed   Breo Ellipta 100-25 MCG/ACT Aepb Generic drug: fluticasone furoate-vilanterol Inhale 1 puff into the lungs daily.   budesonide 32 MCG/ACT nasal spray Commonly known as: RHINOCORT AQUA Place 1 spray into both nostrils daily.   Cholecalciferol 250 MCG (10000 UT) Caps Take 2,000 Units by mouth 1 day or 1 dose.   cyanocobalamin 1000 MCG tablet Take 1,000 mcg by mouth in the morning and at bedtime.   Dapsone  7.5 % Gel Commonly known as: Aczone  Apply to face twice a day for perioral dermatitis at face   diazepam  5 MG tablet Commonly known as:  VALIUM  Take 5 mg by mouth 2 (two) times daily as needed.   dutasteride  0.5 MG capsule Commonly known as: AVODART  Take 1 capsule (0.5 mg total) by mouth daily.   EPINEPHrine  0.3 mg/0.3 mL Soaj injection Commonly known as: EPI-PEN Inject 0.3 mg into the muscle as needed.   famotidine 40 MG tablet Commonly known as: PEPCID Take 40 mg by mouth daily.   FLEXALL ULTRA PLUS EX Apply 1 application topically as needed.   Flovent HFA 110 MCG/ACT inhaler Generic drug: fluticasone Inhale 2 puffs into the lungs.   gentamicin ointment 0.1 % Commonly known as: GARAMYCIN Apply 1 application topically 2 (two) times daily. In nose   halobetasol  0.05 % cream Commonly known as: ULTRAVATE  Apply topically as directed for 14 days, ONCE A DAY up to 5 days a week to AFFECTED AREAS OF psoriasis on hands AS NEEDED FOR psoriasis  flares. Avoid face, groin, axilla   methimazole 5 MG tablet Commonly known as: TAPAZOLE Take 5 mg by mouth daily.   metroNIDAZOLE  0.75 % cream Commonly known as: METROCREAM  Apply topically at bedtime. TO FACE FOR  ROSACEA   montelukast 10 MG tablet Commonly known as: SINGULAIR Take 10 mg by mouth daily.   multivitamin tablet Take 1 tablet by mouth daily.   mupirocin ointment 2 % Commonly known as: BACTROBAN   pantoprazole 40 MG tablet Commonly known as: PROTONIX Take 40 mg by mouth 2 (two) times daily.   polyethylene glycol 17 g packet Commonly known as: MiraLax  Take 17 g by mouth daily.   predniSONE 10 MG tablet Commonly known as: DELTASONE Take 10 mg by mouth 2 (two) times daily.   senna-docusate 8.6-50 MG tablet Commonly known as: Senokot-S Take 2 tablets by mouth 2 (two) times daily.   silodosin  8 MG Caps capsule Commonly known as: RAPAFLO  Take 1 capsule (8 mg total) by mouth daily with breakfast.   Simethicone 180 MG Caps Take 125 mg by mouth every 6 (six) hours as needed for flatulence. (Phazyme)   Vitamin A 2400 MCG (8000 UT) Tabs Take by  mouth daily.   vitamin C 1000 MG tablet Take 1,000 mg by mouth 2 (two) times daily.   zinc gluconate 50 MG tablet Take 50 mg by mouth daily.        Allergies:  Allergies  Allergen Reactions   Levofloxacin     Other Reaction(s): Other (See Comments)  Increased neuropathy and tightness   Tetracycline Hcl     Other Reaction(s): inflamed genitals   Ciprofloxacin Other (See Comments) and Rash    Joint aches  Other Reaction(s): Abdominal Pain, Other (See Comments)   Other Rash    Other reaction(s): Unknown Inflammation   Penicillin G Sodium Rash   Penicillins Rash   Sulfa Antibiotics Rash   Sulfacetamide Sodium Rash   Tetracyclines & Related Other (See Comments)    Inflammation     Family History: No family history on file.  Social History:  reports that he has never smoked. He has never used smokeless tobacco. He reports current alcohol  use. He reports that he does not use drugs.  ROS: Pertinent ROS in HPI  Physical Exam: BP 123/77   Pulse 65   Wt 170 lb (77.1 kg)   SpO2 98%   BMI 27.44 kg/m   Constitutional:  Well nourished. Alert and oriented, No acute distress. HEENT: [place] AT, moist mucus membranes.  Trachea midline Cardiovascular: No clubbing, cyanosis, or edema. Respiratory: Normal respiratory effort, no increased work of breathing. Neurologic: Grossly intact, no focal deficits, moving all 4 extremities. Psychiatric: Normal mood and affect.  Laboratory Data: See Epic and HPI   I have reviewed the labs.   Pertinent Imaging: N/A  Assessment & Plan:    1. Microscopic hematuria - resolved   2. LU TS - He will have his hernia repaired after the holidays and then we will reassess at his return appointment in the spring - Hopefully this will relieve a lot of his urinary symptoms as the bladder is herniating into the inguinal canal   Return for keep follow up as scheduled .  These notes generated with voice recognition software. I apologize for  typographical errors.  CLOTILDA HELON RIGGERS  [facility] [address]  [address] [place], [place] [address] [phone]

## 2024-08-26 ENCOUNTER — Ambulatory Visit: Admitting: Urology

## 2024-08-26 ENCOUNTER — Encounter: Payer: Self-pay | Admitting: Urology

## 2024-08-26 VITALS — BP 123/77 | HR 65 | Wt 170.0 lb

## 2024-08-26 DIAGNOSIS — R399 Unspecified symptoms and signs involving the genitourinary system: Secondary | ICD-10-CM

## 2024-08-26 DIAGNOSIS — R3129 Other microscopic hematuria: Secondary | ICD-10-CM

## 2024-08-26 LAB — MICROSCOPIC EXAMINATION
Bacteria, UA: NONE SEEN
RBC, Urine: NONE SEEN /HPF (ref 0–2)

## 2024-08-26 LAB — URINALYSIS, COMPLETE
Bilirubin, UA: NEGATIVE
Glucose, UA: NEGATIVE
Ketones, UA: NEGATIVE
Leukocytes,UA: NEGATIVE
Nitrite, UA: NEGATIVE
Protein,UA: NEGATIVE
RBC, UA: NEGATIVE
Specific Gravity, UA: <1.005 — ABNORMAL LOW (ref 1.005–1.030)
Urobilinogen, Ur: 0.2 mg/dL (ref 0.2–1.0)
pH, UA: 6 (ref 5.0–7.5)

## 2024-10-01 ENCOUNTER — Ambulatory Visit: Payer: Self-pay | Admitting: Surgery

## 2024-10-01 NOTE — H&P (Signed)
 Subjective:   CC: Non-recurrent unilateral inguinal hernia without obstruction or gangrene [K40.90]  HPI:  Jimmy Obrien is a 79 y.o. male who returns for evaluation of above. Symptoms were first noted several months ago. Recent increase in size.   Past Medical History:  has a past medical history of Allergy, Anxiety, Aortic atherosclerosis, Arrhythmia (20 + years ago), Arthritis, Asthma, unspecified asthma severity, unspecified whether complicated, unspecified whether persistent (HHS-HCC) (Over 15 Years Ago), Atrial fibrillation (CMS/HHS-HCC) (2014), BPH (benign prostatic hyperplasia), Colon polyp (01/08/2014), Diverticulitis, GERD (gastroesophageal reflux disease), History of cataract (Removed Nov 2022), Hyperlipidemia (Not Sure), Hypertension, Liver disease (Fatty Liver), Multinodular goiter, Prediabetes, Seasonal allergies, Sleep apnea, Thrombocytopenia (), and Toxic multinodular goiter.  Past Surgical History:  Past Surgical History:  Procedure Laterality Date   CHOLECYSTECTOMY  1994   APPENDECTOMY  1994   FLEXIBLE SIGMOIDOSCOPY  10/13/1997   Diverticulosis   COLONOSCOPY  04/09/2009   Adenomatous Polyp    COLONOSCOPY  2014   EGD  01/08/2014   04/09/2009, 01/31/2006, 12/22/2002; No repeat per RTE   COLONOSCOPY  01/08/2014   Adenomatous Polyp: [facility] 12/2018: Recall ltr mailed    EGD  08/16/2016   Gastritis: No repeat per RTE   COLONOSCOPY  03/31/2019   Tubular adenoma of the colon/Repeat 79yrs/TKT   EGD  03/31/2019   Reflux esophagitis/No Repeat/TKT   Colon @ [facility]  05/23/2023   Tubular adenomas/Hyperplastic polyp/No repeat/TKT   EGD @ [facility]  05/23/2023   Gastritis/Normal EGD biopsy/No repeat/TKT   CATARACT EXTRACTION  Nov 2022   COLON SURGERY  Removal of 12 section due to diverticulitis   COLONOSCOPY  12/22/2002, 5/23/007   Adenomatous Polyp   Cyst removed from thyroid      PROSTATE SURGERY  Mid 1990's   Right partial colectomy     for diverticulitis   TONSILLECTOMY  1996    Transurethral resection of the prostate      UPPER GASTROINTESTINAL ENDOSCOPY  2014 +-   Wisdom teeth removal      Family History: family history includes Alcohol  abuse in his father; Allergic rhinitis in his father; Alzheimer's disease in his brother and mother; Back Pain in his brother; Colon polyps in his father; Liver cancer in his maternal grandfather; Lung cancer in his father; Myocardial Infarction (Heart attack) in his paternal grandmother; No Known Problems in his maternal grandmother; Stroke in his father and paternal grandfather; Thyroid  disease in his sister.  Social History:  reports that he has never smoked. He has never been exposed to tobacco smoke. He has never used smokeless tobacco. He reports current alcohol  use of about 1.0 - 2.0 standard drink of alcohol  per week. He reports that he does not use drugs.  Current Medications: has a current medication list which includes the following prescription(s): acetaminophen , albuterol  mdi (proventil , ventolin , proair ) hfa, aspirin, atenolol, atorvastatin, azelastine, budesonide, cholecalciferol, cyanocobalamin (vitamin b-12), dapsone , dextroamphetamine-amphetamine, diazepam , doxycycline , dutasteride , epinephrine , famotidine, breo ellipta, gentamicin, methimazole, methyl salicylate/menth/camph, metronidazole , montelukast, multivitamin, mupirocin, pantoprazole, polyethylene glycol, sennosides, and silodosin .  Allergies:  Allergies as of 10/01/2024 - Reviewed 10/01/2024  Allergen Reaction Noted   Cefdinir Other (See Comments) 09/25/2023   Levaquin [levofloxacin] Other (See Comments) 11/16/2022   Penicillins Rash 11/28/2013   Sulfa (sulfonamide antibiotics) Rash 11/28/2013   Tetracycline Swelling 11/28/2013   Tetracycline hcl Unknown 07/10/2023   Ciprofloxacin Abdominal Pain, Other (See Comments), and Rash 11/28/2013   Other Unknown and Rash 05/20/2016   Sulfacetamide sodium Rash 07/10/2023   Tetracyclines Other (See Comments) 05/20/2016  ROS:  A 15 point review of systems was performed and pertinent positives and negatives noted in HPI   Objective:     BP 135/79   Pulse 82   Ht 167.6 cm (5' 6)   Wt 75.8 kg (167 lb)   BMI 26.95 kg/m   Constitutional :  Alert, cooperative, no distress  Lymphatics/Throat:  Supple, no lymphadenopathy  Respiratory:  clear to auscultation bilaterally  Cardiovascular:  regular rate and rhythm  Gastrointestinal: soft, non-tender; bowel sounds normal; no masses,  no organomegaly. inguinal hernia noted.  moderate, reducible, no overlying skin changes, and left  Musculoskeletal: Steady gait and movement  Skin: Cool and moist  Psychiatric: Normal affect, non-agitated, not confused       LABS:  N/a   RADS: N/a Assessment:       Non-recurrent unilateral inguinal hernia without obstruction or gangrene [K40.90]  Plan:     1. Non-recurrent unilateral inguinal hernia without obstruction or gangrene [K40.90]   Discussed the risk of surgery including recurrence, which can be up to 50% in the case of incisional or complex hernias, possible use of prosthetic materials (mesh) and the increased risk of mesh infxn if used, bleeding, chronic pain, post-op infxn, post-op SBO or ileus, and possible re-operation to address said risks. The risks of general anesthetic, if used, includes MI, CVA, sudden death or even reaction to anesthetic medications also discussed. Alternatives include continued observation.  Benefits include possible symptom relief, prevention of incarceration, strangulation, enlargement in size over time, and the risk of emergency surgery in the face of strangulation.   Typical post-op recovery time of 3-5 days with 2 weeks of activity restrictions were also discussed.  ED return precautions given for sudden increase in pain, size of hernia with accompanying fever, nausea, and/or vomiting.  The patient verbalized understanding and all questions were answered to the patient's  satisfaction.    Left robotic assisted laparoscopic, possible open due to colon surgery in past  labs/images/medications/previous chart entries reviewed personally and relevant changes/updates noted above.

## 2024-10-17 ENCOUNTER — Other Ambulatory Visit: Payer: Self-pay

## 2024-10-17 ENCOUNTER — Encounter: Admission: RE | Admit: 2024-10-17 | Source: Ambulatory Visit

## 2024-10-17 ENCOUNTER — Inpatient Hospital Stay: Admission: RE | Admit: 2024-10-17 | Source: Ambulatory Visit

## 2024-10-17 DIAGNOSIS — Z01810 Encounter for preprocedural cardiovascular examination: Secondary | ICD-10-CM

## 2024-10-17 DIAGNOSIS — K4020 Bilateral inguinal hernia, without obstruction or gangrene, not specified as recurrent: Secondary | ICD-10-CM

## 2024-10-17 HISTORY — DX: Prediabetes: R73.03

## 2024-10-17 HISTORY — DX: Unilateral inguinal hernia, without obstruction or gangrene, not specified as recurrent: K40.90

## 2024-10-17 HISTORY — DX: Unspecified osteoarthritis, unspecified site: M19.90

## 2024-10-17 NOTE — Patient Instructions (Addendum)
 Your procedure is scheduled on: 10/23/24 - Thursday Report to the Registration Desk on the 1st floor of the Medical Mall. To find out your arrival time, please call [phone] between 1PM - 3PM on: 10/22/24 - Wednesday If your arrival time is 6:00 am, do not arrive before that time as the Medical Mall entrance doors do not open until 6:00 am.  REMEMBER: Instructions that are not followed completely may result in serious medical risk, up to and including death; or upon the discretion of your surgeon and anesthesiologist your surgery may need to be rescheduled.  Do not eat food after midnight the night before surgery.  No gum chewing or hard candies.  You may however, drink CLEAR liquids up to 2 hours before you are scheduled to arrive for your surgery. Do not drink anything within 2 hours of your scheduled arrival time.  Clear liquids include: - water  - apple juice without pulp - gatorade (not RED colors) - black coffee or tea (Do NOT add milk or creamers to the coffee or tea) Do NOT drink anything that is not on this list.  One week prior to surgery: Stop Anti-inflammatories (NSAIDS) such as Advil, Aleve, Ibuprofen, Motrin, Naproxen, Naprosyn and Aspirin based products such as Excedrin, Goody's Powder, BC Powder.You may continue to take Tylenol  if needed for pain up until the day of surgery.   Stop ANY OVER THE COUNTER supplements until after surgery.  aspirin EC 81 stop taking beginning 10/17/24.  ON THE DAY OF SURGERY ONLY TAKE THESE MEDICATIONS WITH SIPS OF WATER:  atenolol (TENORMIN)  budesonide (RHINOCORT AQUA)  diazepam  (VALIUM ) if needed pantoprazole (PROTONIX)  methimazole (TAPAZOLE)  silodosin  (RAPAFLO )   Use inhalers on the day of surgery and bring to the hospital. - BREO ELLIPTA  - albuterol  (PROVENTIL     No Alcohol  for 24 hours before or after surgery.  No Smoking including e-cigarettes for 24 hours before surgery.  No chewable tobacco products for at  least 6 hours before surgery.  No nicotine patches on the day of surgery.  Do not use any recreational drugs for at least a week (preferably 2 weeks) before your surgery.  Please be advised that the combination of cocaine and anesthesia may have negative outcomes, up to and including death. If you test positive for cocaine, your surgery will be cancelled.  On the morning of surgery brush your teeth with toothpaste and water, you may rinse your mouth with mouthwash if you wish. Do not swallow any toothpaste or mouthwash.  Do not wear jewelry, make-up, hairpins, clips or nail polish.  For welded (permanent) jewelry: bracelets, anklets, waist bands, etc.  Please have this removed prior to surgery.  If it is not removed, there is a chance that hospital personnel will need to cut it off on the day of surgery.  Do not wear lotions, powders, or perfumes.   Do not shave body hair from the neck down 48 hours before surgery.  Contact lenses, hearing aids and dentures may not be worn into surgery.  Do not bring valuables to the hospital. [facility] is not responsible for any missing/lost belongings or valuables.   Notify your doctor if there is any change in your medical condition (cold, fever, infection).  Wear comfortable clothing (specific to your surgery type) to the hospital.  After surgery, you can help prevent lung complications by doing breathing exercises.  Take deep breaths and cough every 1-2 hours. Your doctor may order a device called an Incentive  Spirometer to help you take deep breaths.  If you are being admitted to the hospital overnight, leave your suitcase in the car. After surgery it may be brought to your room.  In case of increased patient census, it may be necessary for you, the patient, to continue your postoperative care in the Same Day Surgery department.  If you are being discharged the day of surgery, you will not be allowed to drive home. You will need a  responsible individual to drive you home and stay with you for 24 hours after surgery.   If you are taking public transportation, you will need to have a responsible individual with you.  Please call the Pre-admissions Testing Dept. at [phone] if you have any questions about these instructions.  Surgery Visitation Policy:  Patients having surgery or a procedure may have two visitors.  Children under the age of 79 must have an adult with them who is not the patient.  Inpatient Visitation:    Visiting hours are 7 a.m. to 8 p.m. Up to four visitors are allowed at one time in a patient room. The visitors may rotate out with other people during the day.  One visitor age 86 or older may stay with the patient overnight and must be in the room by 8 p.m.   Merchandiser, Retail to address health-related social needs:  [email or web address]

## 2024-10-23 ENCOUNTER — Encounter: Payer: Self-pay | Admitting: Urgent Care

## 2024-10-23 ENCOUNTER — Ambulatory Visit: Admission: RE | Admit: 2024-10-23 | Source: Home / Self Care | Admitting: Surgery

## 2024-10-23 ENCOUNTER — Encounter: Admission: RE | Payer: Self-pay | Source: Home / Self Care

## 2024-10-23 SURGERY — REPAIR, HERNIA, INGUINAL, ROBOT-ASSISTED, LAPAROSCOPIC, USING MESH
Anesthesia: General | Site: Groin | Laterality: Left

## 2025-01-05 ENCOUNTER — Ambulatory Visit: Admitting: Urology

## 2025-06-17 ENCOUNTER — Ambulatory Visit: Admitting: Dermatology

## 2025-06-18 ENCOUNTER — Ambulatory Visit: Admitting: Urology
